# Patient Record
Sex: Female | Born: 1935 | Race: Black or African American | Hispanic: No | Smoking: Former smoker
Health system: Southern US, Community
[De-identification: ages and names within clinical notes are randomized; demographics above are authoritative.]

## PROBLEM LIST (undated history)

## (undated) DIAGNOSIS — R0602 Shortness of breath: Secondary | ICD-10-CM

## (undated) DIAGNOSIS — N390 Urinary tract infection, site not specified: Secondary | ICD-10-CM

## (undated) DIAGNOSIS — E785 Hyperlipidemia, unspecified: Secondary | ICD-10-CM

## (undated) DIAGNOSIS — M1990 Unspecified osteoarthritis, unspecified site: Secondary | ICD-10-CM

## (undated) DIAGNOSIS — K5792 Diverticulitis of intestine, part unspecified, without perforation or abscess without bleeding: Secondary | ICD-10-CM

## (undated) DIAGNOSIS — K5790 Diverticulosis of intestine, part unspecified, without perforation or abscess without bleeding: Secondary | ICD-10-CM

## (undated) DIAGNOSIS — R112 Nausea with vomiting, unspecified: Secondary | ICD-10-CM

## (undated) DIAGNOSIS — N179 Acute kidney failure, unspecified: Secondary | ICD-10-CM

## (undated) DIAGNOSIS — I10 Essential (primary) hypertension: Secondary | ICD-10-CM

## (undated) DIAGNOSIS — K831 Obstruction of bile duct: Secondary | ICD-10-CM

## (undated) DIAGNOSIS — I499 Cardiac arrhythmia, unspecified: Secondary | ICD-10-CM

## (undated) DIAGNOSIS — Z9889 Other specified postprocedural states: Secondary | ICD-10-CM

## (undated) HISTORY — PX: KNEE SURGERY: SHX244

## (undated) HISTORY — DX: Essential (primary) hypertension: I10

## (undated) HISTORY — DX: Urinary tract infection, site not specified: N39.0

## (undated) HISTORY — DX: Diverticulitis of intestine, part unspecified, without perforation or abscess without bleeding: K57.92

## (undated) HISTORY — PX: TOTAL ABDOMINAL HYSTERECTOMY: SHX209

## (undated) HISTORY — DX: Unspecified osteoarthritis, unspecified site: M19.90

## (undated) HISTORY — PX: ERCP: SHX60

## (undated) HISTORY — DX: Diverticulosis of intestine, part unspecified, without perforation or abscess without bleeding: K57.90

## (undated) HISTORY — PX: CHOLECYSTECTOMY: SHX55

## (undated) HISTORY — PX: TOTAL KNEE ARTHROPLASTY: SHX125

## (undated) HISTORY — DX: Obstruction of bile duct: K83.1

## (undated) HISTORY — DX: Hyperlipidemia, unspecified: E78.5

## (undated) HISTORY — PX: FOOT SURGERY: SHX648

---

## 2005-06-16 ENCOUNTER — Inpatient Hospital Stay (HOSPITAL_BASED_OUTPATIENT_CLINIC_OR_DEPARTMENT_OTHER): Admission: RE | Admit: 2005-06-16 | Discharge: 2005-06-16 | Payer: Self-pay | Admitting: *Deleted

## 2008-10-24 ENCOUNTER — Emergency Department (HOSPITAL_COMMUNITY): Admission: EM | Admit: 2008-10-24 | Discharge: 2008-10-24 | Payer: Self-pay | Admitting: Emergency Medicine

## 2010-01-06 ENCOUNTER — Emergency Department (HOSPITAL_COMMUNITY): Admission: EM | Admit: 2010-01-06 | Discharge: 2010-01-06 | Payer: Self-pay | Admitting: Emergency Medicine

## 2010-10-17 ENCOUNTER — Encounter: Payer: Self-pay | Admitting: Cardiovascular Disease

## 2010-10-23 ENCOUNTER — Encounter: Payer: Self-pay | Admitting: Cardiovascular Disease

## 2010-10-23 ENCOUNTER — Ambulatory Visit (INDEPENDENT_AMBULATORY_CARE_PROVIDER_SITE_OTHER): Payer: Medicare Other | Admitting: Cardiovascular Disease

## 2010-10-23 VITALS — BP 118/66 | HR 60 | Wt 160.0 lb

## 2010-10-23 DIAGNOSIS — E785 Hyperlipidemia, unspecified: Secondary | ICD-10-CM

## 2010-10-23 DIAGNOSIS — I10 Essential (primary) hypertension: Secondary | ICD-10-CM

## 2010-10-23 DIAGNOSIS — Z0000 Encounter for general adult medical examination without abnormal findings: Secondary | ICD-10-CM

## 2010-10-23 NOTE — Assessment & Plan Note (Signed)
Her blood pressure is well controlled. She stopped taking the losartan HCTZ and is now just on Toprol-XL. She should continue this medication. We will have her followup with Dr. Paulino Rily. For this and consider on an as-needed basis.

## 2010-10-23 NOTE — Assessment & Plan Note (Signed)
This is been followed by Dr. Paulino Rily. We'll see her on an as-needed basis.

## 2010-10-23 NOTE — Progress Notes (Signed)
History of Present Illness: Kimberly Rasmussen is an elderly female the history of hypertension and hypercholesterolemia. She's a  previous  a patient of Dr. Reyes Ivan.She's not had any episodes of chest pain or shortness breath.  Current Outpatient Prescriptions on File Prior to Visit  Medication Sig Dispense Refill  . Acetaminophen (TYLENOL PO) Take by mouth as needed.        . metoprolol succinate (TOPROL-XL) 25 MG 24 hr tablet Take 25 mg by mouth daily.        Marland Kitchen NIACIN PO Take by mouth at bedtime.        . simvastatin (ZOCOR) 20 MG tablet Take 20 mg by mouth at bedtime.        Marland Kitchen DISCONTD: losartan-hydrochlorothiazide (HYZAAR) 100-25 MG per tablet Take 1 tablet by mouth daily.        Marland Kitchen DISCONTD: MELOXICAM PO Take by mouth as needed.        Marland Kitchen DISCONTD: Nitroglycerin (NITRO-DUR TD) Place onto the skin daily.        Marland Kitchen DISCONTD: nitroGLYCERIN (NITROSTAT) 0.4 MG SL tablet Place 0.4 mg under the tongue every 5 (five) minutes as needed.        Marland Kitchen DISCONTD: traMADol (ULTRAM) 50 MG tablet Take 50 mg by mouth as needed.        Marland Kitchen DISCONTD: Tramadol-Acetaminophen (ULTRACET PO) Take by mouth as needed.          No Known Allergies  Past Medical History  Diagnosis Date  . HTN (hypertension)   . Dyslipidemia   . Diverticulosis   . Arthritis   . Diverticulitis     Past Surgical History  Procedure Date  . Total abdominal hysterectomy   . Total knee arthroplasty     RIGHT KNEE  . Knee surgery     LEFT KNEE  . Cholecystectomy     History  Smoking status  . Former Smoker -- 0.5 packs/day for 40 years  . Types: Cigarettes  . Quit date: 10/17/1998  Smokeless tobacco  . Not on file    History  Alcohol Use     Family History  Problem Relation Age of Onset  . Pneumonia Mother   . Lung cancer Sister   . Hypertension Maternal Grandmother   . Stroke Maternal Grandmother     Review of Systems: The review of systems is Noted in the history of present illness. All other systems are negative.   All other systems were reviewed and are negative.  Physical Exam: BP 118/66  Pulse 60  Wt 160 lb (72.576 kg) The patient is alert and oriented x 3.  The mood and affect are normal.  The skin is warm and dry.  Color is normal.  The HEENT exam reveals that the sclera are nonicteric.  The mucous membranes are moist.  The carotids are 2+ without bruits.  There is no thyromegaly.  There is no JVD.  The lungs are clear.  The chest wall is non tender.  The heart exam reveals a regular rate with a normal S1 and S2.  There are no murmurs, gallops, or rubs.  The PMI is not displaced.   Abdominal exam reveals good bowel sounds.  There is no guarding or rebound.  There is no hepatosplenomegaly or tenderness.  There are no masses.  Exam of the legs reveal no clubbing, cyanosis, or edema.  The legs are without rashes.  The distal pulses are intact.  Cranial nerves II - XII are intact.  Motor and sensory  functions are intact.  The gait is normal.  ECG: Normal sinus rhythm. She has occasional premature ventricular contractions Assessment / Plan:

## 2010-10-23 NOTE — Patient Instructions (Signed)
Watch your salt.  Exercise regularly.

## 2010-11-06 LAB — CBC
HCT: 37.3 % (ref 36.0–46.0)
Hemoglobin: 12.4 g/dL (ref 12.0–15.0)
MCHC: 33.3 g/dL (ref 30.0–36.0)
MCV: 88 fL (ref 78.0–100.0)
RDW: 16.5 % — ABNORMAL HIGH (ref 11.5–15.5)

## 2010-11-06 LAB — URINE MICROSCOPIC-ADD ON

## 2010-11-06 LAB — URINE CULTURE

## 2010-11-06 LAB — DIFFERENTIAL
Basophils Relative: 0 % (ref 0–1)
Eosinophils Absolute: 0.2 10*3/uL (ref 0.0–0.7)
Eosinophils Relative: 2 % (ref 0–5)
Lymphocytes Relative: 17 % (ref 12–46)
Neutrophils Relative %: 72 % (ref 43–77)

## 2010-11-06 LAB — URINALYSIS, ROUTINE W REFLEX MICROSCOPIC
Hgb urine dipstick: NEGATIVE
Nitrite: POSITIVE — AB
Specific Gravity, Urine: 1.014 (ref 1.005–1.030)
Urobilinogen, UA: 0.2 mg/dL (ref 0.0–1.0)

## 2010-11-06 LAB — COMPREHENSIVE METABOLIC PANEL
BUN: 15 mg/dL (ref 6–23)
Calcium: 9.3 mg/dL (ref 8.4–10.5)
Creatinine, Ser: 1.18 mg/dL (ref 0.4–1.2)
Glucose, Bld: 94 mg/dL (ref 70–99)
Total Protein: 6.5 g/dL (ref 6.0–8.3)

## 2010-12-12 NOTE — Cardiovascular Report (Signed)
NAME:  Kimberly Rasmussen, Kimberly Rasmussen NO.:  [id number]   MEDICAL RECORD NO.:  [id number]          PATIENT TYPE:  OIB   LOCATION:  1965                         FACILITY:  [facility]   PHYSICIAN:  Elmore Guise., M.D.DATE OF BIRTH:  04/14/1926   DATE OF PROCEDURE:  06/16/2005  DATE OF DISCHARGE:                              CARDIAC CATHETERIZATION   INDICATIONS FOR PROCEDURE:  Increasing exertional angina/dyspnea.   DESCRIPTION OF PROCEDURE:  The patient was brought to the cardiac  catheterization laboratory after appropriate informed consent.  She is  prepped and draped in a sterile fashion.  Approximately 10 mL of 1%  lidocaine is used for local anesthesia.  A 4-French sheath was placed in the  right femoral artery without difficulty.  Coronary angiography, LV  angiography were then performed.   FINDINGS:  1.  Left main:  Normal.  2.  LAD:  Mild luminal irregularities.  3.  D1:  Large vessel with mild luminal irregularities.  4.  D2:  Moderate sized vessel.  Mild luminal irregularities.  5.  LCX:  Nondominant.  Mild luminal irregularities.  6.  OM1/OM2:  Moderate sized vessels with mild luminal irregularities.  7.  RCA:  Dominant with mild luminal irregularities.  8.  PDA/PLV:  Mild luminal irregularities.  9.  LV:  EF is 55-60%.  No wall motion abnormalities.  LVEDP is 18 mmHg.   IMPRESSION:  1.  Non-obstructive coronary arteries with mild luminal irregularities.  2.  Preserved left ventricular systolic function with an ejection fraction      of 55-60%.   PLAN:  Aggressive risk factor modification.      Elmore Guise., M.D.  Electronically Signed     TWK/MEDQ  D:  06/16/2005  T:  06/16/2005  Job:  [id number]   cc:   Evelena Peat, M.D.  Fax: [phone]

## 2011-01-05 ENCOUNTER — Inpatient Hospital Stay (HOSPITAL_COMMUNITY)
Admission: EM | Admit: 2011-01-05 | Discharge: 2011-01-08 | DRG: 690 | Disposition: A | Discharge status: Skilled Nursing Facility | Payer: Medicare Other | Attending: Internal Medicine | Admitting: Internal Medicine

## 2011-01-05 ENCOUNTER — Emergency Department (HOSPITAL_COMMUNITY): Payer: Medicare Other

## 2011-01-05 DIAGNOSIS — I10 Essential (primary) hypertension: Secondary | ICD-10-CM | POA: Diagnosis present

## 2011-01-05 DIAGNOSIS — I2510 Atherosclerotic heart disease of native coronary artery without angina pectoris: Secondary | ICD-10-CM | POA: Diagnosis present

## 2011-01-05 DIAGNOSIS — A498 Other bacterial infections of unspecified site: Secondary | ICD-10-CM | POA: Diagnosis present

## 2011-01-05 DIAGNOSIS — I951 Orthostatic hypotension: Secondary | ICD-10-CM | POA: Diagnosis present

## 2011-01-05 DIAGNOSIS — K5289 Other specified noninfective gastroenteritis and colitis: Secondary | ICD-10-CM | POA: Diagnosis present

## 2011-01-05 DIAGNOSIS — K5730 Diverticulosis of large intestine without perforation or abscess without bleeding: Secondary | ICD-10-CM | POA: Diagnosis present

## 2011-01-05 DIAGNOSIS — E559 Vitamin D deficiency, unspecified: Secondary | ICD-10-CM | POA: Diagnosis present

## 2011-01-05 DIAGNOSIS — N390 Urinary tract infection, site not specified: Principal | ICD-10-CM | POA: Diagnosis present

## 2011-01-05 DIAGNOSIS — E785 Hyperlipidemia, unspecified: Secondary | ICD-10-CM | POA: Diagnosis present

## 2011-01-05 DIAGNOSIS — Z23 Encounter for immunization: Secondary | ICD-10-CM

## 2011-01-05 LAB — DIFFERENTIAL
Basophils Absolute: 0 10*3/uL (ref 0.0–0.1)
Basophils Relative: 0 % (ref 0–1)
Eosinophils Relative: 0 % (ref 0–5)
Lymphocytes Relative: 10 % — ABNORMAL LOW (ref 12–46)
Lymphs Abs: 2.5 10*3/uL (ref 0.7–4.0)
Neutro Abs: 21.4 10*3/uL — ABNORMAL HIGH (ref 1.7–7.7)
Neutrophils Relative %: 56 % (ref 43–77)
Promyelocytes Absolute: 0 %
nRBC: 0 /100 WBC

## 2011-01-05 LAB — URINALYSIS, ROUTINE W REFLEX MICROSCOPIC
Glucose, UA: NEGATIVE mg/dL
Nitrite: POSITIVE — AB
Specific Gravity, Urine: 1.026 (ref 1.005–1.030)
pH: 5 (ref 5.0–8.0)

## 2011-01-05 LAB — CK TOTAL AND CKMB (NOT AT ARMC)
CK, MB: 3.3 ng/mL (ref 0.3–4.0)
Relative Index: INVALID (ref 0.0–2.5)
Total CK: 98 U/L (ref 7–177)

## 2011-01-05 LAB — CBC
HCT: 38.5 % (ref 36.0–46.0)
MCH: 27.9 pg (ref 26.0–34.0)
MCV: 83.2 fL (ref 78.0–100.0)
Platelets: 92 10*3/uL — ABNORMAL LOW (ref 150–400)
RBC: 4.63 MIL/uL (ref 3.87–5.11)

## 2011-01-05 LAB — URINE MICROSCOPIC-ADD ON

## 2011-01-05 LAB — BASIC METABOLIC PANEL
BUN: 43 mg/dL — ABNORMAL HIGH (ref 6–23)
Calcium: 9.8 mg/dL (ref 8.4–10.5)
Creatinine, Ser: 1.23 mg/dL — ABNORMAL HIGH (ref 0.4–1.2)
GFR calc Af Amer: 50 mL/min — ABNORMAL LOW (ref >60)
GFR calc non Af Amer: 42 mL/min — ABNORMAL LOW (ref >60)
Glucose, Bld: 75 mg/dL (ref 70–99)

## 2011-01-06 ENCOUNTER — Encounter (HOSPITAL_COMMUNITY): Payer: Self-pay | Admitting: Radiology

## 2011-01-06 ENCOUNTER — Inpatient Hospital Stay (HOSPITAL_COMMUNITY): Payer: Medicare Other

## 2011-01-06 LAB — COMPREHENSIVE METABOLIC PANEL
AST: 48 U/L — ABNORMAL HIGH (ref 0–37)
Albumin: 2.1 g/dL — ABNORMAL LOW (ref 3.5–5.2)
Alkaline Phosphatase: 167 U/L — ABNORMAL HIGH (ref 39–117)
CO2: 22 mEq/L (ref 19–32)
Chloride: 103 mEq/L (ref 96–112)
GFR calc non Af Amer: 51 mL/min — ABNORMAL LOW (ref >60)
Potassium: 3.6 mEq/L (ref 3.5–5.1)
Total Bilirubin: 0.8 mg/dL (ref 0.3–1.2)

## 2011-01-06 LAB — LACTATE DEHYDROGENASE: LDH: 243 U/L (ref 94–250)

## 2011-01-06 LAB — DIFFERENTIAL
Basophils Relative: 0 % (ref 0–1)
Eosinophils Relative: 1 % (ref 0–5)
Lymphs Abs: 1.6 10*3/uL (ref 0.7–4.0)
Monocytes Absolute: 0.8 10*3/uL (ref 0.1–1.0)
Monocytes Relative: 4 % (ref 3–12)

## 2011-01-06 LAB — CBC
HCT: 33.5 % — ABNORMAL LOW (ref 36.0–46.0)
Hemoglobin: 11.6 g/dL — ABNORMAL LOW (ref 12.0–15.0)
MCHC: 34.6 g/dL (ref 30.0–36.0)
MCV: 82.1 fL (ref 78.0–100.0)
RDW: 15.8 % — ABNORMAL HIGH (ref 11.5–15.5)
WBC: 19.8 10*3/uL — ABNORMAL HIGH (ref 4.0–10.5)

## 2011-01-06 LAB — APTT: aPTT: 34 seconds (ref 24–37)

## 2011-01-06 LAB — PROTIME-INR: INR: 1.2 (ref 0.00–1.49)

## 2011-01-06 LAB — GLUCOSE, CAPILLARY

## 2011-01-06 MED ORDER — IOHEXOL 300 MG/ML  SOLN
100.0000 mL | Freq: Once | INTRAMUSCULAR | Status: AC | PRN
  Administered 2011-01-06: 100 mL via INTRAVENOUS

## 2011-01-07 ENCOUNTER — Inpatient Hospital Stay (HOSPITAL_COMMUNITY): Payer: Medicare Other

## 2011-01-07 LAB — CBC
MCH: 27.7 pg (ref 26.0–34.0)
MCHC: 33.9 g/dL (ref 30.0–36.0)
RDW: 16 % — ABNORMAL HIGH (ref 11.5–15.5)

## 2011-01-07 LAB — BASIC METABOLIC PANEL
Calcium: 8.6 mg/dL (ref 8.4–10.5)
GFR calc Af Amer: >60 mL/min (ref >60)
GFR calc non Af Amer: >60 mL/min (ref >60)
Potassium: 3.5 mEq/L (ref 3.5–5.1)
Sodium: 133 mEq/L — ABNORMAL LOW (ref 135–145)

## 2011-01-07 LAB — URINE CULTURE
Colony Count: >=100000
Culture  Setup Time: 201206120522

## 2011-01-07 LAB — HAPTOGLOBIN: Haptoglobin: 429 mg/dL — ABNORMAL HIGH (ref 30–200)

## 2011-01-08 LAB — BASIC METABOLIC PANEL
CO2: 21 mEq/L (ref 19–32)
Calcium: 8.6 mg/dL (ref 8.4–10.5)
Chloride: 107 mEq/L (ref 96–112)
Creatinine, Ser: 0.51 mg/dL (ref 0.4–1.2)
GFR calc Af Amer: >60 mL/min (ref >60)
Sodium: 136 mEq/L (ref 135–145)

## 2011-01-12 ENCOUNTER — Other Ambulatory Visit: Payer: Self-pay | Admitting: Internal Medicine

## 2011-01-12 DIAGNOSIS — K529 Noninfective gastroenteritis and colitis, unspecified: Secondary | ICD-10-CM

## 2011-01-12 LAB — CULTURE, BLOOD (ROUTINE X 2)
Culture  Setup Time: 201206122241
Culture: NO GROWTH

## 2011-01-13 ENCOUNTER — Inpatient Hospital Stay (HOSPITAL_COMMUNITY)
Admission: EM | Admit: 2011-01-13 | Discharge: 2011-01-17 | DRG: 445 | Disposition: A | Discharge status: Home Health | Payer: Medicare Other | Attending: Internal Medicine | Admitting: Internal Medicine

## 2011-01-13 ENCOUNTER — Ambulatory Visit
Admission: RE | Admit: 2011-01-13 | Discharge: 2011-01-13 | Disposition: A | Discharge status: Home / Self Care | Payer: Medicare Other | Source: Ambulatory Visit | Attending: Internal Medicine | Admitting: Internal Medicine

## 2011-01-13 DIAGNOSIS — D649 Anemia, unspecified: Secondary | ICD-10-CM | POA: Diagnosis present

## 2011-01-13 DIAGNOSIS — E559 Vitamin D deficiency, unspecified: Secondary | ICD-10-CM | POA: Diagnosis present

## 2011-01-13 DIAGNOSIS — Z87440 Personal history of urinary (tract) infections: Secondary | ICD-10-CM

## 2011-01-13 DIAGNOSIS — K449 Diaphragmatic hernia without obstruction or gangrene: Secondary | ICD-10-CM | POA: Diagnosis present

## 2011-01-13 DIAGNOSIS — B029 Zoster without complications: Secondary | ICD-10-CM | POA: Diagnosis present

## 2011-01-13 DIAGNOSIS — K5732 Diverticulitis of large intestine without perforation or abscess without bleeding: Secondary | ICD-10-CM | POA: Diagnosis present

## 2011-01-13 DIAGNOSIS — K8051 Calculus of bile duct without cholangitis or cholecystitis with obstruction: Principal | ICD-10-CM | POA: Diagnosis present

## 2011-01-13 DIAGNOSIS — I2510 Atherosclerotic heart disease of native coronary artery without angina pectoris: Secondary | ICD-10-CM | POA: Diagnosis present

## 2011-01-13 DIAGNOSIS — R5381 Other malaise: Secondary | ICD-10-CM | POA: Diagnosis present

## 2011-01-13 DIAGNOSIS — E785 Hyperlipidemia, unspecified: Secondary | ICD-10-CM | POA: Diagnosis present

## 2011-01-13 DIAGNOSIS — I10 Essential (primary) hypertension: Secondary | ICD-10-CM | POA: Diagnosis present

## 2011-01-13 DIAGNOSIS — E871 Hypo-osmolality and hyponatremia: Secondary | ICD-10-CM | POA: Diagnosis present

## 2011-01-13 DIAGNOSIS — K4090 Unilateral inguinal hernia, without obstruction or gangrene, not specified as recurrent: Secondary | ICD-10-CM | POA: Diagnosis present

## 2011-01-13 DIAGNOSIS — Z79899 Other long term (current) drug therapy: Secondary | ICD-10-CM

## 2011-01-13 DIAGNOSIS — Z96659 Presence of unspecified artificial knee joint: Secondary | ICD-10-CM

## 2011-01-13 DIAGNOSIS — K529 Noninfective gastroenteritis and colitis, unspecified: Secondary | ICD-10-CM

## 2011-01-13 LAB — DIFFERENTIAL
Basophils Relative: 0 % (ref 0–1)
Eosinophils Relative: 0 % (ref 0–5)
Lymphocytes Relative: 6 % — ABNORMAL LOW (ref 12–46)
Lymphs Abs: 1.6 10*3/uL (ref 0.7–4.0)
Monocytes Relative: 7 % (ref 3–12)
Neutro Abs: 23.8 10*3/uL — ABNORMAL HIGH (ref 1.7–7.7)

## 2011-01-13 LAB — CBC
HCT: 29.8 % — ABNORMAL LOW (ref 36.0–46.0)
Hemoglobin: 10.5 g/dL — ABNORMAL LOW (ref 12.0–15.0)
MCH: 28.1 pg (ref 26.0–34.0)
MCV: 79.7 fL (ref 78.0–100.0)
Platelets: 500 10*3/uL — ABNORMAL HIGH (ref 150–400)
RBC: 3.74 MIL/uL — ABNORMAL LOW (ref 3.87–5.11)
WBC: 27.3 10*3/uL — ABNORMAL HIGH (ref 4.0–10.5)

## 2011-01-13 LAB — COMPREHENSIVE METABOLIC PANEL
ALT: 29 U/L (ref 0–35)
AST: 69 U/L — ABNORMAL HIGH (ref 0–37)
Alkaline Phosphatase: 417 U/L — ABNORMAL HIGH (ref 39–117)
Calcium: 8.9 mg/dL (ref 8.4–10.5)
Glucose, Bld: 84 mg/dL (ref 70–99)
Potassium: 5.1 mEq/L (ref 3.5–5.1)
Sodium: 129 mEq/L — ABNORMAL LOW (ref 135–145)
Total Protein: 7.1 g/dL (ref 6.0–8.3)

## 2011-01-13 MED ORDER — IOHEXOL 300 MG/ML  SOLN
100.0000 mL | Freq: Once | INTRAMUSCULAR | Status: AC | PRN
  Administered 2011-01-13: 100 mL via INTRAVENOUS

## 2011-01-14 LAB — CBC
Hemoglobin: 8.5 g/dL — ABNORMAL LOW (ref 12.0–15.0)
MCH: 27.5 pg (ref 26.0–34.0)
Platelets: 472 10*3/uL — ABNORMAL HIGH (ref 150–400)
RBC: 3.09 MIL/uL — ABNORMAL LOW (ref 3.87–5.11)

## 2011-01-14 LAB — CARDIAC PANEL(CRET KIN+CKTOT+MB+TROPI)
CK, MB: 1.2 ng/mL (ref 0.3–4.0)
CK, MB: 1.6 ng/mL (ref 0.3–4.0)
Relative Index: INVALID (ref 0.0–2.5)
Relative Index: INVALID (ref 0.0–2.5)
Total CK: 17 U/L (ref 7–177)
Total CK: 15 U/L (ref 7–177)
Troponin I: <0.3 ng/mL (ref <0.30)
Troponin I: <0.3 ng/mL (ref <0.30)

## 2011-01-14 LAB — COMPREHENSIVE METABOLIC PANEL
AST: 44 U/L — ABNORMAL HIGH (ref 0–37)
CO2: 24 mEq/L (ref 19–32)
Calcium: 8.6 mg/dL (ref 8.4–10.5)
Creatinine, Ser: 0.57 mg/dL (ref 0.50–1.10)
GFR calc Af Amer: >60 mL/min (ref >60)
GFR calc non Af Amer: >60 mL/min (ref >60)
Glucose, Bld: 101 mg/dL — ABNORMAL HIGH (ref 70–99)
Total Protein: 6.1 g/dL (ref 6.0–8.3)

## 2011-01-14 LAB — MRSA PCR SCREENING: MRSA by PCR: NEGATIVE

## 2011-01-15 LAB — COMPREHENSIVE METABOLIC PANEL
ALT: 20 U/L (ref 0–35)
AST: 34 U/L (ref 0–37)
Albumin: 1.6 g/dL — ABNORMAL LOW (ref 3.5–5.2)
Alkaline Phosphatase: 258 U/L — ABNORMAL HIGH (ref 39–117)
BUN: 5 mg/dL — ABNORMAL LOW (ref 6–23)
Chloride: 105 mEq/L (ref 96–112)
Potassium: 3.7 mEq/L (ref 3.5–5.1)
Sodium: 135 mEq/L (ref 135–145)
Total Bilirubin: 1.7 mg/dL — ABNORMAL HIGH (ref 0.3–1.2)

## 2011-01-15 LAB — CBC
MCH: 27.5 pg (ref 26.0–34.0)
MCV: 80.5 fL (ref 78.0–100.0)
Platelets: 551 10*3/uL — ABNORMAL HIGH (ref 150–400)
RDW: 16.2 % — ABNORMAL HIGH (ref 11.5–15.5)

## 2011-01-16 ENCOUNTER — Inpatient Hospital Stay (HOSPITAL_COMMUNITY): Payer: Medicare Other

## 2011-01-16 LAB — COMPREHENSIVE METABOLIC PANEL
ALT: 18 U/L (ref 0–35)
AST: 28 U/L (ref 0–37)
Calcium: 8.3 mg/dL — ABNORMAL LOW (ref 8.4–10.5)
Creatinine, Ser: 0.53 mg/dL (ref 0.50–1.10)
GFR calc Af Amer: >60 mL/min (ref >60)
GFR calc non Af Amer: >60 mL/min (ref >60)
Sodium: 134 mEq/L — ABNORMAL LOW (ref 135–145)
Total Protein: 6.2 g/dL (ref 6.0–8.3)

## 2011-01-16 LAB — CBC
MCH: 27.6 pg (ref 26.0–34.0)
MCHC: 33.9 g/dL (ref 30.0–36.0)
MCV: 81.4 fL (ref 78.0–100.0)
Platelets: 603 10*3/uL — ABNORMAL HIGH (ref 150–400)

## 2011-01-18 NOTE — Consult Note (Signed)
NAME:  Kimberly Rasmussen, Kimberly Rasmussen NO.:  [id number]  MEDICAL RECORD NO.:  [id number]  LOCATION:  1332                         FACILITY:  [facility]  PHYSICIAN:  Juanetta Gosling, MDDATE OF BIRTH:  09-Mar-1926  DATE OF CONSULTATION: DATE OF DISCHARGE:                                CONSULTATION   CHIEF COMPLAINT:  Common bile duct stones and some abdominal pain.  HISTORY OF PRESENT ILLNESS:  This is 77 year old female, who was admitted from the January 05, 2011 to the January 08, 2011 for orthostatic hypotension as well as abdominal pain.  At that point, she was diagnosed with diverticular disease with diverticulitis by CT scan as well as having some left lower quadrant pain and this apparently improved and she was then transferred back to a nursing home.  She then 5 days later returned to the hospital with abdominal pain and was having 7-8 intensity abdominal pain in the periumbilical region as well as the left lower quadrant.  She has had some nausea as well.  She underwent evaluation again with a CT scan that showed prominent diverticula, but less likely diverticulitis, some markedly dilated intrahepatic and extrahepatic bile ducts with three gallstones measuring up to 3 cm and moved a little bit since her CT a week earlier.  She also has a number of other findings on this as well.  She has then undergone an ERCP today by Dr. Jeani Hawking with dilated biliary tree with some large common bile duct intraductal calculi with a common bile duct stent inserted today.  We were asked to discuss possible surgical therapy for these common bile duct stones.  PAST MEDICAL HISTORY:  Significant for: 1. History of diverticular disease. 2. Hyperlipidemia. 3. Hypertension. 4. Coronary artery disease. 5. History of shingles.  PAST SURGICAL HISTORY: 1. Likely cholecystectomy by her imaging here and she states that in     [place] in early 90s, she had some stones removal, I believe she    did, but is not sure about her gallbladder neither is her family. 2. Hysterectomy. 3. Bilateral knee surgeries. 4. Right total knee replacement. 5. Foot surgery.  ALLERGIES: 1. TRAMADOL. 2. AMITRIPTYLINE. 3. NEURONTIN. 4. MELOXICAM.  MEDICATIONS:  As of today are: 1. Flonase. 2. Ciprofloxacin. 3. Flagyl. 4. P.r.n. Dilaudid. 5. P.r.n. Zofran. 6. Tylenol.  SOCIAL HISTORY:  She does not use alcohol and is an ex-smoker.  PHYSICAL EXAMINATION:  VITAL SIGNS:  Today, shows her to be somewhat somnolent after an ERCP earlier.  She is afebrile with otherwise her rate 80, blood pressure 135/75, respirations 17 and 96 on room air. GENERAL:  She is a well-appearing female, in no distress. HEART:  Regular. LUNGS:  Clear. ABDOMEN:  Soft.  It is nontender.  It is nondistended.  She has no left lower quadrant tenderness or any tenderness in her epigastrium or right upper quadrant.  She has a well-healed upper midline incision.  LABORATORY DATA:  Laboratory evaluation today, her CMET is significant for a sodium of 134, BUN of 3, total bilirubin of 1.4, alkaline phosphatase 213, albumin is 1.6.  Her white blood cell count is 13.1, hematocrit 25.4, platelets are 603,000.  Bilirubin on the January 14, 2011 was 3.0.  DIAGNOSTIC STUDIES:  Her radiologic evaluation is a CT as described before as well as her ERCP.  ASSESSMENT: 1. Choledocholithiasis after possible cholecystectomy in the past. 2. Possible diverticulitis.  PLAN:  I think  with her choledocholithiasis it may well be the best plan for possible lithotripsy. I have now discussed this with Dr. Elnoria Howard.  I discussed a common bile duct exploration.  I think this will be somewhat difficult given her prior surgery, her age as well as we discussed the procedure with a T- tube afterwards and the risks involved with surgery. She also appears to have a prior cholecystectomy. She would like to try to do this nonoperatively possible and I think  that it is reasonable if there is another option, especially given her albumin and her debilitated state for been in the hospital recently as well.  I think she is fairly at high risk for an operative procedure.  I discussed this with Dr. Elnoria Howard and he would be willing to try do a repeat ERCP and a lithotripsy and I think that that would be reasonable.  If that fails, we can save surgery as an option.  She is currently drained right now, so there is no emergency about taking her to the operating room.     Juanetta Gosling, MD     MCW/MEDQ  D:  01/16/2011  T:  01/16/2011  Job:  [id number]  cc:   Jasmine December Dr. Cyndia Bent D. Elnoria Howard, MD Fax: [phone]  Electronically Signed by Emelia Loron MD on 01/18/2011 11:01:02 AM

## 2011-01-19 NOTE — H&P (Signed)
NAME:  Kimberly Rasmussen, Kimberly Rasmussen NO.:  [id number]  MEDICAL RECORD NO.:  [id number]  LOCATION:  WLED                         FACILITY:  [facility]  PHYSICIAN:  Kathlen Mody, MD       DATE OF BIRTH:  11-05-1925  DATE OF ADMISSION:  01/05/2011 DATE OF DISCHARGE:                             HISTORY & PHYSICAL   CHIEF COMPLAINT:  Sending from PMD's office for orthostatic hypotension and abdominal pain.  HISTORY OF PRESENT ILLNESS:  This is an 77 year old lady with history of hypertension, hyperlipidemia, diverticulosis, was complaining of left lower quadrant abdominal pain since 3-4 days, associated with nausea and vomiting and low grade fever.  No history of chills.  The history was obtained both from the patient and the patient's daughter who is at the bedside.  The patient also has been lethargic over the last 3-4 days, after the patient's daughter took the patient to PMD's office where she was found to have orthostatic hypotension and left lower quadrant abdominal pain.  The patient has occasional shortness of breath.  No history of cough or syncope, chest pain, or palpitations.  Nausea has improved with the medication given in the ER.  Occasional loose bowel movements with some blood on the toilet paper and history of generalized weakness to the point that she was not able to ambulate for the last 2 days.  The patient denies any headache or blurry vision.  Denies any tingling or numbness or any focal weakness in her extremities.  REVIEW OF SYSTEMS:  See HPI, otherwise negative.  PAST MEDICAL HISTORY: 1. History of hypertension. 2. Hyperlipidemia. 3. Nonobstructive coronary artery disease. 4. Diverticulosis with colonoscopy about a year ago. 5. Vitamin D deficiency. 6. Shingles with neuralgia.  PAST SURGICAL HISTORY:  History of cholecystectomy, cataract surgery, history of hysterectomy, history of bilateral knee surgery, history of left ORIF, right TKR, history of  foot surgery.  HOME MEDICATIONS:  Please see Med-Rec for detail of medications and their doses.  SOCIAL HISTORY:  The patient lives by herself, ex-smoker, denies EtOH or IV drug abuse.  The patient's daughter lives 15 minutes from her.  ALLERGIES:  The patient is allergic to: 1. TRAMADOL. 2. AMITRIPTYLINE. 3. GABAPENTIN. 4. MELOXICAM.  PHYSICAL EXAMINATION:  VITAL SIGNS:  The patient's vitals include temperature of 98.1, pulse of 88, blood pressure of 100/50, respirations 16, saturating 98% on room air. GENERAL:  She is alert.  She is afebrile.  She is communicating.  She is in no acute distress. HEENT:  The pupils are reacting to light.  Dry mucous membranes.  No JVD. CARDIOVASCULAR:  S1 and S2 heard.  Regular rate and rhythm. RESPIRATORY:  Chest is clear to auscultation bilaterally.  No wheezing or rhonchi. ABDOMEN:  Soft.  Mild left lower quadrant tenderness.  Bowel sounds present.  No signs of peritonitis. EXTREMITIES:  No pedal edema. NEUROLOGIC:  Able to move all her extremities.  She is alert and oriented x2. PERTINENT LABORATORY DATA:  On admission, the patient had a CBC significant for WBC count of 25.2, platelets of 92.  Basic metabolic panel significant for creatinine of 1.23, BUN of 43.  Urinalysis showed small leukocytes and positive nitrites with many bacteria.  Troponin  and CK-MB and CK were within normal limits.  RADIOLOGIC DATA:  The patient had a chest x-ray which showed some mild cardiomegaly with mild interstitial edema, minimal blunting of the costophrenic angle suspicious for small bilateral effusions.  Poor paraspinal extenuation along the descending thoracic aorta, to consider later chest radiography.  ASSESSMENT AND PLAN:  This is an 77 year old lady with history of hypertension, diverticulosis, admitted for left lower quadrant pain, low grade fevers, nausea and vomiting, loose bowel movements.  The patient is being admitted for lethargy secondary  to urinary tract infection. The patient was started on IV Rocephin in the ER.  We will continue the Rocephin and we will also give her IV fluids, normal saline 80 mL per hour.  Urine culture is pending.  Left lower quadrant pain/leukocytosis/loose bowel movements/occasional blood per rectum.  Rule out diverticulitis.  The patient already has a history of diverticulosis.  Will get a CT abdomen with pelvis with oral contrast.  Generalized weakness, most likely secondary to dehydration.  The patient's BUN and creatinine are slightly elevated, most likely prerenal from dehydration.  The patient is being started on intravenous fluids, normal saline at 80 mL per hour.  We will repeat basic metabolic panel in the morning.  Hypertension.  Blood pressure parameters on the lower side.  We will hold the blood pressure up till tomorrow and restart when the patient is normal again.  Thrombocytopenia.  Hold the Lovenox.  Start the patient on SCDs.  The patient is full code.          ______________________________ Kathlen Mody, MD     VA/MEDQ  D:  01/05/2011  T:  01/05/2011  Job:  [id number]  Electronically Signed by Kathlen Mody MD on 01/19/2011 02:28:12 AM

## 2011-01-20 LAB — CULTURE, BLOOD (ROUTINE X 2)
Culture  Setup Time: 201206200441
Culture: NO GROWTH
Culture: NO GROWTH

## 2011-01-25 NOTE — H&P (Signed)
NAME:  Kimberly Rasmussen, Kimberly Rasmussen          ACCOUNT NO.:  [id number]  MEDICAL RECORD NO.:  [id number]  LOCATION:  1332                         FACILITY:  [facility]  PHYSICIAN:  Lonia Blood, M.D.      DATE OF BIRTH:  March 04, 1926  DATE OF ADMISSION:  01/13/2011 DATE OF DISCHARGE:                             HISTORY & PHYSICAL   PRIMARY CARE PHYSICIAN:  Jasmine December A. Paulino Rily, MD  PRESENTING COMPLAINT:  Abdominal pain.  HISTORY OF PRESENT ILLNESS:  The patient is an 77 year old female who was last discharged from the hospital on January 08, 2011, that is 5 days ago, but returned now to the hospital with abdominal pain.  During last hospitalization, she was found to have mild acute colitis as well as UTI.  She was admitted then for orthostatic hypotension.  She went home and apparently has not been doing well at home.  She is having severe abdominal pain that was rated as 7/10 to 8/10, located in the periumbilical region, but going to the left lower quadrant.  She has also had some nausea, but no vomiting.  She has had cholecystectomy before.  Her pain is worsened with food or movement and there is no relieving factor.  She denied any hematemesis, no melena, no hematochezia.  PAST MEDICAL HISTORY: 1. Diverticular disease. 2. Hypertension. 3. Hyperlipidemia. 4. Recent UTI due to E coli. 5. History of coronary artery disease that was nonobstructive. 6. Vitamin D deficiency. 7. History of shingles with known neuralgia. 8. History of cholecystitis.  PAST SURGICAL HISTORY:  Status post cholecystectomy, status post hysterectomy, status post bilateral knee surgery, history of left ORIF, history of right TKR, history of foot surgery.  ALLERGIES: 1. TRAMADOL. 2. AMITRIPTYLINE that causes delusions. 3. NEURONTIN. 4. MELOXICAM.  CURRENT MEDICATIONS: 1. Cefuroxime that she is taking 500 mg twice a day. 2. Tylenol Extra Strength 500 mg 2 tablets daily. 3. Colace 100 mg twice daily. 4. Vitamin D3 over  the counter 1 capsule daily. 5. Skelaxin 800 mg daily. 6. Simvastatin 20 mg daily. 7. Niacin 250 mg daily. 8. Nasonex nasal spray 2 sprays each nostril daily. 9. Multivitamins 1 tablet daily. 10.Celebrex 200 mg daily. 11.Glucosamine 500 mg daily.  SOCIAL HISTORY:  The patient was just discharged from the hospital and she went home.  She lives alone.  She is an ex-smoker, but has not smoked in a while.  No alcohol or IV drug use.  She has a daughter, living about 50 minutes away from her and she helps her.  FAMILY HISTORY:  Noncontributory due to her age.  REVIEW OF SYSTEMS:  All systems reviewed currently are negative except per HPI.  PHYSICAL EXAMINATION:  VITAL SIGNS:  Temperature is 98.1, blood pressure 160/72 with pulse 96, respiratory rate 20, her saturation is 95% on room air.  GENERAL:  She is acutely ill-looking, frail woman, in no acute distress. HEENT:  PERRL.  EOMI.  No significant pallor or jaundice.  She is normocephalic. NECK:  Supple.  No visible JVD.  No lymphadenopathy. RESPIRATORY:  She has good air entry bilaterally.  No rhonchi, no rales, no wheezes. CARDIOVASCULAR SYSTEM:  Slightly irregularly irregular rhythm, but no murmur or gallop. ABDOMEN:  Soft, nondistended.  No  palpable masses.  No hepatosplenomegaly.  She has mild tenderness in the left lower quadrant. EXTREMITIES:  No edema, cyanosis, or clubbing. SKIN:  No rashes.  No ulcers.  Normal color. NEUROLOGIC:  She is alert and oriented x3.  No significant lateralizing signs.  LABORATORY DATA:  White count is 27.3 with a left shift ANC of 23.8, hemoglobin 10.5 with platelet count of 500.  Lipase is 61.  Sodium 129, potassium 5.1, chloride is 96, CO2 of 23, glucose 84, BUN 8, creatinine less than 0.47.  Calcium 8.9, total protein 7.1, albumin 2.2 with an AST 69, ALT 29, alkaline phosphatase 417, and total bilirubin 7.1.  CT abdomen and pelvis shows mild diverticulitis involving the descending colon,  sigmoid colon region, shifting of large extrahepatic bile duct stones with persistent marked bile duct dilatation, scoliosis, and degenerative changes in the lumbar spine, fatty content in the left inguinal hernia.  ASSESSMENT:  This is an 77 year old female presenting with what appears to be acute diverticulitis and choledocholithiasis.  She has other multiple medical problems which are chronic and seems stable.  PLAN: 1. Acute diverticulitis.  We will admit the patient for bowel rest,     pain control, and other symptoms control.  It seems like the     patient who has been on antibiotics, is not properly adequate.  She     is on cefuroxime, so we will switch her to Cipro and Flagyl.  GI     will be seeing the patient also in this followup.  No evidence of     abscess or clots. 2. Choledocholithiasis.  The patient would probably benefit from ERCP,     so GI will be consulted for that. 3. Hypertension:  Continue the patient's blood pressure medications as     much as possible.  She will be only on clear liquids, so we maybe     able to give her oral medications. 4. Hyperlipidemia.  Again, the patient has just had a workup and is on     home medications that will resume as soon as practical. 5. History of coronary artery disease.  The patient has not complained     of any chest pain and there is no evidence that she is having any     coronary syndrome, besides her disease is nonobstructive.  We will     watch her closely in the hospital. 6. Anemia, seems like anemia of chronic disease.  We will follow the     hemoglobin and hematocrit closely. 7. Recent UTI.  The UTI was secondary to E coli, was resistant to     Cipro, however, the patient has been on treatment for the required     period of time.  If needed, we will resume the cefuroxime that is     sensitive to, in addition to her Cipro and Flagyl. 8. Hyponatremia, more than likely from dehydration.  We will continue     with  hydration. 9. Final note, I will resume PT/OT on the patient as soon as possible.     Lonia Blood, M.D.     Verlin Grills  D:  01/14/2011  T:  01/14/2011  Job:  [id number]  Electronically Signed by Lonia Blood M.D. on 01/25/2011 12:37:16 AM

## 2011-01-26 ENCOUNTER — Telehealth: Payer: Self-pay | Admitting: Cardiovascular Disease

## 2011-01-26 NOTE — Telephone Encounter (Signed)
Faxed EKG to [facility] ([phone]).

## 2011-01-27 ENCOUNTER — Ambulatory Visit (HOSPITAL_COMMUNITY)
Admission: RE | Admit: 2011-01-27 | Discharge: 2011-01-27 | Disposition: A | Discharge status: Home / Self Care | Payer: Medicare Other | Source: Ambulatory Visit | Attending: Gastroenterology | Admitting: Gastroenterology

## 2011-01-30 ENCOUNTER — Ambulatory Visit (HOSPITAL_COMMUNITY): Payer: Medicare Other

## 2011-01-30 ENCOUNTER — Ambulatory Visit (HOSPITAL_COMMUNITY)
Admission: RE | Admit: 2011-01-30 | Discharge: 2011-01-30 | Disposition: A | Discharge status: Home / Self Care | Payer: Medicare Other | Source: Ambulatory Visit | Attending: Gastroenterology | Admitting: Gastroenterology

## 2011-01-30 DIAGNOSIS — K8050 Calculus of bile duct without cholangitis or cholecystitis without obstruction: Secondary | ICD-10-CM | POA: Insufficient documentation

## 2011-01-30 NOTE — Discharge Summary (Signed)
NAME:  CARLETA, WOODROW NO.:  [id number]  MEDICAL RECORD NO.:  [id number]  LOCATION:  1332                         FACILITY:  [facility]  PHYSICIAN:  Peggye Pitt, M.D. DATE OF BIRTH:  [birth year]  DATE OF ADMISSION:  01/13/2011 DATE OF DISCHARGE:  01/17/2011                              DISCHARGE SUMMARY   PRIMARY CARE PHYSICIAN:  Emeterio Reeve, MD.  SURGEON:  Juanetta Gosling, MD.  GI PHYSICIAN:  Anselmo Rod, MD, Clementeen Graham and Jordan Hawks. Elnoria Howard, MD  DISCHARGE DIAGNOSES: 1. Choledocholithiasis. 2. Sigmoid diverticulitis. 3. Transaminitis secondary to choledocholithiasis, improved. 4. Leukocytosis secondary to diverticulitis, improved. 5. Normocytic anemia. 6. History of hypertension. 7. Hyperlipidemia. 8. Nonobstructive coronary artery disease. 9. Vitamin D deficiency. 10.Prior history of shingles.  DISCHARGE MEDICATIONS: 1. Cipro 500 mg twice daily for 21 days. 2. Flagyl 500 mg 3 times a day for 21 days. 3. Zofran 4 mg every 6 hours as needed for nausea. 4. Oxycodone 5 mg every 4 hours as needed for pain. 5. Colace 100 mg twice daily. 6. Glucosamine 500 mg daily. 7. Multivitamin 1 tablet daily. 8. Nasonex 2 sprays in each nostril daily. 9. Niacin 250 mg daily. 10.Skelaxin 800 mg daily. 11.Vitamin D3 over-the-counter 1 tablet daily.  DISPOSITION AND FOLLOWUP:  Ms. Weyenberg will be discharged home today with her daughter, Bonita Quin, in stable and improved condition.  Please note that she has scheduled a repeat ERCP with lithotripsy with Dr. Elnoria Howard, scheduled for July 6.  She will follow up with Dr. Elnoria Howard for this issue.  CONSULTATION THIS HOSPITALIZATION:  Dr. Loreta Ave and Dr. Elnoria Howard with GI and Dr. Dwain Sarna with Surgery.  IMAGES AND PROCEDURES PERFORMED DURING THIS HOSPITALIZATION:  A CT scan of the abdomen and pelvis on June 19 showed prominent diverticula of the descending and sigmoid colon with mild diverticulitis without abscess or evidence of focal  perforation.  Mark dilated intrahepatic and extrahepatic bile ducts, which contained three gallstones measuring up to 3 cm.  A small hiatal hernia.  No hydronephrosis.  HISTORY AND PHYSICAL:  For full details, please see dictation on June 20 by Dr. Mikeal Hawthorne.  In brief, Ms. Lasky is an 77 year old African-American lady admitted to our hospital on June 19 with complaint of abdominal pain.  She had recently been discharged from the hospital on June 14 and diagnosed with diverticulitis and a urinary tract infection and orthostatic hypotension.  From there, she was discharged to a skilled nursing facility for rehabilitation purposes.  She returned with complaints of left lower quadrant and right upper quadrant abdominal pain with nausea.  No vomiting.  Because of this, we are asked to admit her for further evaluation.  HOSPITAL COURSE BY PROBLEM: 1. Abdominal pain.  We have found 2 separate sources for abdominal     pain.  (a) Left lower quadrant pain.  This is secondary to the     descending and sigmoid colon diverticulitis.  She was initially     kept n.p.o. and on clear liquids.  We have subsequently advanced     her diet.  She has been placed on Cipro, Flagyl which she will take     for a total of 21 days.  She is  now tolerating solid foods and is     doing well from this perspective.  (b) Right upper quadrant pain     which is presumed secondary to her choledocholithiasis.  Dr. Elnoria Howard     did perform an ERCP on June 22; however, the stones were very large     and he was not able to extract them.  He did place a stent for     drainage of the bile ducts.  Her bilirubin has started to decrease     as well as all of her LFTs which were now pretty much within normal     limits with the exception of still slightly elevated bili and alk     phos.  Following results of ERCP, surgery was consulted.  However,     Dr. Dwain Sarna believes that surgery would be a major undertaking in     this lady  with also possibility of bile duct reconstruction, so he     has discussed with Dr. Elnoria Howard and they have decided to at least try     the lithotripsy first and save surgery as a last measure.  This     ERCP with lithotripsy has been scheduled for July 6 at Dr. Haywood Pao     office. 2. History of transaminitis.  Again, this is secondary to her bile     duct obstruction because of stones.  She had a bili as high as 7.1     and this has decreased to 1.4 on discharge.  Her alk phos on     admission was 417 and this has decreased to 258.  AST and ALT have     now normalized. 3. Leukocytosis which is related most likely to her diverticulitis.     On day of discharge, her WBC count is 13,000 and rapidly     decreasing. 4. Rest of chronic conditions have been stable. 5. Vitals on day of discharge, blood pressure 125/60, heart rate 84,     respirations 20, sats of 94% on room air and temperature of 98.4.     Peggye Pitt, M.D.     EH/MEDQ  D:  01/17/2011  T:  01/17/2011  Job:  [id number]  cc:   Emeterio Reeve, MD Fax: [phone]  Juanetta Gosling, MD [address] [place] [address]  Anselmo Rod, MD, FACGFax: [phone]  Jordan Hawks. Elnoria Howard, MD Fax: [phone]  Electronically Signed by Peggye Pitt M.D. on 01/30/2011 08:10:25 AM

## 2011-02-02 ENCOUNTER — Other Ambulatory Visit: Payer: Self-pay | Admitting: *Deleted

## 2011-02-02 MED ORDER — METOPROLOL SUCCINATE ER 25 MG PO TB24: 25.0000 mg | ORAL_TABLET | Freq: Every day | ORAL | Status: DC

## 2011-02-02 NOTE — Telephone Encounter (Signed)
Fax received from pharmacy. Refill completed. Jodette Olney Monier RN  

## 2011-02-18 NOTE — Discharge Summary (Signed)
NAME:  Kimberly Rasmussen, Kimberly Rasmussen NO.:  [id number]  MEDICAL RECORD NO.:  [id number]  LOCATION:  1308                         FACILITY:  [facility]  PHYSICIAN:  Clydia Llano, MD       DATE OF BIRTH:  1926-01-07  DATE OF ADMISSION:  01/05/2011 DATE OF DISCHARGE:                        DISCHARGE SUMMARY - REFERRING   PRIMARY CARE PHYSICIAN:  Emeterio Reeve, MD  REASON FOR ADMISSION:  Sent from her primary care physician because of orthostatic hypotension.  DISCHARGE DIAGNOSES: 1. Escherichia coli urinary tract infection. 2. Mild acute colitis. 3. History of hypertension. 4. Hyperlipidemia. 5. Nonobstructive coronary artery disease. 6. Diverticulosis. 7. Vitamin D deficiency. 8. Shingles with neuralgia.  DISCHARGE MEDICATIONS: 1. Cefuroxime 500 mg p.o. b.i.d. for five more days. 2. Celebrex 200 mg p.o. daily. 3. Glucosamine  HCL 500 complex one cap p.o. daily. 4. Metronidazole 500 mg 3 times a day for several more days. 5. Multivitamin 1 tablet p.o. daily. 6. Nasonex 2 sprays nasally daily in each nostril. 7. Niacin 250 mg p.o. daily. 8. Simvastatin 20 mg p.o. daily at bedtime. 9. Skelaxin 800 mg p.o. daily. 10.Vitamin D3 OTC 1 capsule p.o. daily.  BRIEF HISTORY EXAMINATION:  Kimberly Rasmussen is an 77 year old African- American female with past medical history of hypertension, hyperlipidemia, and diverticulosis.  The patient was complaining about left quadrant abdominal pain 3 to 4 days prior to admission.  Associated with nausea and vomited once.  The patient had low-grade fever as well. History obtained from both the patient and the patient's daughter who was at bedside at time of admission.  The patient has also been weak and lethargic for the past 3-4 days prior to admission.  The patient was evaluated on the day of admission at her primary care physician's office where she was found to have orthostatic hypotension and  left lower quadrant abdominal pain.  The  patient was transferred to the hospital for further evaluation.  Upon further evaluation in the emergency department, CT scan showed mild colitis and UA showed UTI.  The patient admitted for further evaluation.  RADIOLOGY:  Chest x-ray showed cardiomegaly with low-volume basilar airspace disease, airspace disease and atelectasis is most compatible with  CHF, multifocal pneumonia is considered unlikely.  There is underlying emphysema.  CT abdomen and pelvis showed mild acute diverticulitis at the junction of the descending and the sigmoid colon. No evidence of abscess or microperforation.  There are 3 large gallstones within the extrahepatic bile duct with marked intra and extra hepatic biliary dilatation.  The stones are all in order of approximately 3 cm in diameter.  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  There is small hiatal hernia and small left inguinal hernia containing fat, emphysematous changes involving the right lung base, possible interstitial lung disease.  BRIEF HOSPITAL COURSE: 1. E-coli UTI.  The patient admitted to the hospital for further     evaluation.  Her UA was consistent with UTI.  The culture showed E-     coli which is resistant to fluoroquinolones.  The patient was on     Cipro.  At the hospital she spiked fever and that was switched to  ceftriaxone and at the time of discharge was switched to Ceftin.     The patient will have antibiotic for 5 days to be continued in the     skilled nursing facility. 2. Acute mild colitis per CT scan.  The patient was on Cipro and     Flagyl for that.  Because the patient was going to get Ceftin,     Cipro was discontinued and Flagyl will be continued for 7 more     days.  The patient does have mild colitis.  The patient is     tolerating food and no diarrhea or loose stools. 3. Hypertension.  Medications were continued throughout the  hospital     stay.  Blood pressure is nicely controlled. 4. Vitamin D deficiency.  Vitamin D was continued.  Levels were not     obtained but it is recommended to be obtained every 3 to 6 months. 5. Shingles with neuralgia.  No complaints during this hospital.  DISCHARGE INSTRUCTIONS:  DISPOSITION:  Skilled nursing facility.  DIET:  Heart-healthy diet.  ACTIVITY:  As tolerated.     Clydia Llano, MD     ME/MEDQ  D:  01/08/2011  T:  01/08/2011  Job:  [id number]  cc:   Emeterio Reeve, MD Fax: [phone]  Electronically Signed by Clydia Llano  on 02/18/2011 07:56:46 PM

## 2011-02-24 ENCOUNTER — Ambulatory Visit (HOSPITAL_COMMUNITY)
Admission: RE | Admit: 2011-02-24 | Discharge: 2011-02-24 | Disposition: A | Discharge status: Home / Self Care | Payer: Medicare Other | Source: Ambulatory Visit | Attending: Gastroenterology | Admitting: Gastroenterology

## 2011-02-24 DIAGNOSIS — I4949 Other premature depolarization: Secondary | ICD-10-CM | POA: Insufficient documentation

## 2011-02-24 DIAGNOSIS — R9431 Abnormal electrocardiogram [ECG] [EKG]: Secondary | ICD-10-CM | POA: Insufficient documentation

## 2011-02-27 ENCOUNTER — Ambulatory Visit (HOSPITAL_COMMUNITY)
Admission: RE | Admit: 2011-02-27 | Discharge: 2011-02-27 | Disposition: A | Discharge status: Home / Self Care | Payer: Medicare Other | Source: Ambulatory Visit | Attending: Gastroenterology | Admitting: Gastroenterology

## 2011-02-27 ENCOUNTER — Ambulatory Visit (HOSPITAL_COMMUNITY): Payer: Medicare Other

## 2011-02-27 DIAGNOSIS — M129 Arthropathy, unspecified: Secondary | ICD-10-CM | POA: Insufficient documentation

## 2011-02-27 DIAGNOSIS — I2510 Atherosclerotic heart disease of native coronary artery without angina pectoris: Secondary | ICD-10-CM | POA: Insufficient documentation

## 2011-02-27 DIAGNOSIS — K8050 Calculus of bile duct without cholangitis or cholecystitis without obstruction: Secondary | ICD-10-CM | POA: Insufficient documentation

## 2011-06-10 ENCOUNTER — Other Ambulatory Visit: Payer: Self-pay | Admitting: Orthopedic Surgery

## 2011-06-30 ENCOUNTER — Encounter (HOSPITAL_COMMUNITY): Payer: Self-pay | Admitting: Pharmacy Technician

## 2011-07-06 ENCOUNTER — Encounter (HOSPITAL_COMMUNITY): Payer: Self-pay

## 2011-07-06 ENCOUNTER — Encounter (HOSPITAL_COMMUNITY)
Admission: RE | Admit: 2011-07-06 | Discharge: 2011-07-06 | Disposition: A | Discharge status: Home / Self Care | Payer: Medicare Other | Source: Ambulatory Visit | Attending: Orthopedic Surgery | Admitting: Orthopedic Surgery

## 2011-07-06 ENCOUNTER — Other Ambulatory Visit: Payer: Self-pay | Admitting: Orthopedic Surgery

## 2011-07-06 ENCOUNTER — Encounter (HOSPITAL_COMMUNITY)
Admission: RE | Admit: 2011-07-06 | Discharge: 2011-07-06 | Payer: Medicare Other | Source: Ambulatory Visit | Attending: Orthopedic Surgery | Admitting: Orthopedic Surgery

## 2011-07-06 HISTORY — DX: Shortness of breath: R06.02

## 2011-07-06 HISTORY — DX: Other specified postprocedural states: Z98.890

## 2011-07-06 HISTORY — DX: Cardiac arrhythmia, unspecified: I49.9

## 2011-07-06 HISTORY — DX: Nausea with vomiting, unspecified: R11.2

## 2011-07-06 LAB — URINALYSIS, ROUTINE W REFLEX MICROSCOPIC
Ketones, ur: NEGATIVE mg/dL
Nitrite: NEGATIVE
Urobilinogen, UA: 1 mg/dL (ref 0.0–1.0)

## 2011-07-06 LAB — URINE MICROSCOPIC-ADD ON

## 2011-07-06 LAB — DIFFERENTIAL
Basophils Absolute: 0 10*3/uL (ref 0.0–0.1)
Basophils Relative: 0 % (ref 0–1)
Eosinophils Absolute: 0.3 10*3/uL (ref 0.0–0.7)
Neutrophils Relative %: 61 % (ref 43–77)

## 2011-07-06 LAB — TYPE AND SCREEN: Antibody Screen: NEGATIVE

## 2011-07-06 LAB — PROTIME-INR
INR: 1 (ref 0.00–1.49)
Prothrombin Time: 13.4 seconds (ref 11.6–15.2)

## 2011-07-06 LAB — CBC
MCH: 28.4 pg (ref 26.0–34.0)
MCHC: 31.7 g/dL (ref 30.0–36.0)
Platelets: 340 10*3/uL (ref 150–400)

## 2011-07-06 LAB — BASIC METABOLIC PANEL
BUN: 15 mg/dL (ref 6–23)
Calcium: 10.2 mg/dL (ref 8.4–10.5)
GFR calc non Af Amer: 79 mL/min — ABNORMAL LOW (ref >90)
Glucose, Bld: 54 mg/dL — ABNORMAL LOW (ref 70–99)
Sodium: 144 mEq/L (ref 135–145)

## 2011-07-06 LAB — ABO/RH: ABO/RH(D): O POS

## 2011-07-06 MED ORDER — CHLORHEXIDINE GLUCONATE 4 % EX LIQD: 60.0000 mL | Freq: Once | CUTANEOUS | Status: DC

## 2011-07-06 NOTE — Pre-Procedure Instructions (Signed)
20 Kimberly Rasmussen  07/06/2011   Your procedure is scheduled on:  07-13-11 Monday  Report to [facility] at 8:30 AM.  Call this number if you have problems the morning of surgery: [phone]   Remember:   Do not eat food:After Midnight.  May have clear liquids: up to 4 Hours before arrival.  Clear liquids include soda, tea, black coffee, apple or grape juice, broth.  Take these medicines the morning of surgery with A SIP OF WATER: None   Do not wear jewelry, make-up or nail polish.  Do not wear lotions, powders, or perfumes. You may wear deodorant.  Do not shave 48 hours prior to surgery.  Do not bring valuables to the hospital.  Contacts, dentures or bridgework may not be worn into surgery.  Leave suitcase in the car. After surgery it may be brought to your room.  For patients admitted to the hospital, checkout time is 11:00 AM the day of discharge.   Patients discharged the day of surgery will not be allowed to drive home.  Name and phone number of your driver: Bonita Quin - daughter [phone]  Special Instructions: Incentive Spirometry - Practice and bring it with you on the day of surgery. and CHG Shower Use Special Wash: 1/2 bottle night before surgery and 1/2 bottle morning of surgery.   Please read over the following fact sheets that you were given: Pain Booklet, Coughing and Deep Breathing, Blood Transfusion Information, MRSA Information and Surgical Site Infection Prevention

## 2011-07-07 ENCOUNTER — Encounter (HOSPITAL_COMMUNITY): Payer: Self-pay | Admitting: Vascular Surgery

## 2011-07-07 NOTE — Consult Note (Signed)
Anesthesia:  Patient is an 77 year old female scheduled for a left TKA with hardware removal on 07/13/11.  Her history includes HTN, dyslipidemia, diverticulitis, OA, former smoker.  Her nursing assessment also mentions a history of hepatitis or jaundice.  I spoke with Ms. William's and her daughter who denied any hx of infectious Hepatitis.  She did however develop jaundice related to common bile duct stones in June of this year and was evaluated by Dr. Dwain Sarna and Dr. Elnoria Howard.  She is S/P ERCP, lithotripsy, and removal of stones in July and August of this year.  Her AST and ALT were normal on 01/16/11.  Her total bilirubin was mildly elevated at 1.4.  I reviewed her preoperative labs from yesterday.  Her glucose was low at 54.  I will order a CBG on arrival to Short Stay since she will be NPO and is not a first case.  I will also check a HFP since these have not been rechecked since her GI procedures.  Her CXR and EKG were also reviewed.  CXR shows possible emphysema, but no acute process.  Her EKG from 03/12/11 show ST.  HR at PAT is now 80.  She has seen Dr. Elease Hashimoto on 10/23/10 for HTN and HLD f/u, but after that appointment he felt she could f/u with Cardiology on a PRN basis.  She had a cath in 2006 showing non-obstructive coronary arteries with mild luminal irregularities, EF 55-60%, otherwise no recent Cardiac studies.  Plan to proceed.

## 2011-07-10 NOTE — H&P (Signed)
  HISTORY OF PRESENT ILLNESS:  Last seen about a year ago for end stage arthritis of her left knee with valgus deformity and a right total knee that was put in 15 or 16 years ago with an unknown design that continues to do well.  In any event, she comes in today because of progressive valgus deformity of her left knee and increasing pain.  She has already had Supartz injections in the past that helped some, but her pain is increasing and it is to the point where she is almost falling down because of left knee pain.  PAST MEDICAL HISTORY:  Significant for high blood pressure, hyperlipidemia. MEDICATIONS:  Hydrochlorothiazide 100/25, simvastatin 20 mg, metoprolol 25 mg, Niacin 250 mg, glucosamine chondroitin nitro 0.2 mg, and Aleve. ALLERGIES:  NKDA. HOSPITALIZATIONS:  Left knee surgery 20 years ago and right knee replacement 15 years ago. PAST SURGICAL HISTORY:  As above. REVIEW OF SYSTEMS:  She wears glasses.  She has sleep disorder.  All other systems negative. FAMILY MEDICAL HISTORY:  Significant for high blood pressure and arthritis. SOCIAL HISTORY:  She is retired.  She is widowed.  She denies alcohol and tobacco use.  PHYSICAL EXAMINATION:  She appears healthy, no acute distress, alert and oriented.  Height 5 feet and 5 inches and weight 160 pounds.  She does have some progression of her valgus deformity, which is up to about 15 degrees.  She can come to full extension, she flexes to 120, she is quite tender along the lateral joint line. The incision over the patella on the left side which is from an open reduction internal fixation is well- Healed.  RADIOGRAPHS:  X-rays were ordered, performed, and interpreted by me today and show progressive destruction of the lateral joint with some erosion of the lateral tibial plateau.  The patellofemoral joint appears to be in a relatively good condition, but the screws are totally encased in bone and will be a challenge to get out if she elects to have a  knee replacement.  Radiographs of the right total knee show well-laced, well-fixed prostheses with no overt evidence of loosening.  On the right side her range of motion is 0 to about 115 degrees.  IMPRESSION:  Severe end stage arthritis left knee with valgus deformity, but no flexion contracture. On the right side she has a stable, well-placed, well-functioning total knee placed 15 or 16 years ago with an unknown design, it was placed by another physician in another location.  PLAN: At this point, Kimberly Rasmussen has failed all conservative measures, so she is a candidate for left total knee arthroplasty.  Risks and benefits of surgery were discussed.

## 2011-07-12 MED ORDER — CEFAZOLIN SODIUM 1-5 GM-% IV SOLN
1.0000 g | INTRAVENOUS | Status: AC
  Administered 2011-07-13: 1 g via INTRAVENOUS
  Filled 2011-07-12: qty 50

## 2011-07-13 ENCOUNTER — Encounter (HOSPITAL_COMMUNITY): Payer: Self-pay | Admitting: Orthopedic Surgery

## 2011-07-13 ENCOUNTER — Encounter (HOSPITAL_COMMUNITY)
Admission: RE | Disposition: A | Discharge status: Skilled Nursing Facility | Payer: Self-pay | Source: Ambulatory Visit | Attending: Orthopedic Surgery

## 2011-07-13 ENCOUNTER — Inpatient Hospital Stay (HOSPITAL_COMMUNITY): Payer: Medicare Other | Admitting: Vascular Surgery

## 2011-07-13 ENCOUNTER — Inpatient Hospital Stay (HOSPITAL_COMMUNITY): Payer: Medicare Other

## 2011-07-13 ENCOUNTER — Encounter (HOSPITAL_COMMUNITY): Payer: Self-pay | Admitting: Vascular Surgery

## 2011-07-13 ENCOUNTER — Inpatient Hospital Stay (HOSPITAL_COMMUNITY)
Admission: RE | Admit: 2011-07-13 | Discharge: 2011-07-16 | DRG: 470 | Disposition: A | Discharge status: Skilled Nursing Facility | Payer: Medicare Other | Source: Ambulatory Visit | Attending: Orthopedic Surgery | Admitting: Orthopedic Surgery

## 2011-07-13 DIAGNOSIS — M1710 Unilateral primary osteoarthritis, unspecified knee: Principal | ICD-10-CM | POA: Diagnosis present

## 2011-07-13 DIAGNOSIS — I10 Essential (primary) hypertension: Secondary | ICD-10-CM | POA: Diagnosis present

## 2011-07-13 DIAGNOSIS — M1712 Unilateral primary osteoarthritis, left knee: Secondary | ICD-10-CM | POA: Diagnosis present

## 2011-07-13 DIAGNOSIS — Z79899 Other long term (current) drug therapy: Secondary | ICD-10-CM

## 2011-07-13 DIAGNOSIS — E785 Hyperlipidemia, unspecified: Secondary | ICD-10-CM | POA: Diagnosis present

## 2011-07-13 DIAGNOSIS — Z96659 Presence of unspecified artificial knee joint: Secondary | ICD-10-CM

## 2011-07-13 DIAGNOSIS — Z472 Encounter for removal of internal fixation device: Secondary | ICD-10-CM

## 2011-07-13 HISTORY — PX: TOTAL KNEE ARTHROPLASTY: SHX125

## 2011-07-13 LAB — HEPATIC FUNCTION PANEL
AST: 22 U/L (ref 0–37)
Albumin: 3.3 g/dL — ABNORMAL LOW (ref 3.5–5.2)
Bilirubin, Direct: <0.1 mg/dL (ref 0.0–0.3)

## 2011-07-13 SURGERY — ARTHROPLASTY, KNEE, TOTAL
Anesthesia: Regional | Site: Knee | Laterality: Left | Wound class: Clean

## 2011-07-13 MED ORDER — ACETAMINOPHEN 650 MG RE SUPP: 650.0000 mg | Freq: Four times a day (QID) | RECTAL | Status: DC | PRN

## 2011-07-13 MED ORDER — GLYCOPYRROLATE 0.2 MG/ML IJ SOLN
INTRAMUSCULAR | Status: DC | PRN
  Administered 2011-07-13: .4 mg via INTRAVENOUS

## 2011-07-13 MED ORDER — PHENOL 1.4 % MT LIQD
1.0000 | OROMUCOSAL | Status: DC | PRN
  Filled 2011-07-13: qty 177

## 2011-07-13 MED ORDER — METOCLOPRAMIDE HCL 5 MG/ML IJ SOLN
5.0000 mg | Freq: Three times a day (TID) | INTRAMUSCULAR | Status: DC | PRN
  Filled 2011-07-13: qty 2

## 2011-07-13 MED ORDER — ACETAMINOPHEN 10 MG/ML IV SOLN
INTRAVENOUS | Status: DC | PRN
  Administered 2011-07-13: 1000 mg via INTRAVENOUS

## 2011-07-13 MED ORDER — ALUM & MAG HYDROXIDE-SIMETH 200-200-20 MG/5ML PO SUSP: 30.0000 mL | ORAL | Status: DC | PRN

## 2011-07-13 MED ORDER — ENOXAPARIN SODIUM 30 MG/0.3ML ~~LOC~~ SOLN
30.0000 mg | Freq: Two times a day (BID) | SUBCUTANEOUS | Status: DC
  Administered 2011-07-14 – 2011-07-16 (×4): 30 mg via SUBCUTANEOUS
  Filled 2011-07-13 (×6): qty 0.3

## 2011-07-13 MED ORDER — CEFUROXIME SODIUM 1.5 G IJ SOLR
INTRAMUSCULAR | Status: DC | PRN
  Administered 2011-07-13: 1.5 g

## 2011-07-13 MED ORDER — DEXTROSE 5 % IV SOLN
INTRAVENOUS | Status: DC | PRN
  Administered 2011-07-13: 11:00:00 via INTRAVENOUS

## 2011-07-13 MED ORDER — WARFARIN VIDEO: Freq: Once | Status: DC

## 2011-07-13 MED ORDER — HYDROMORPHONE HCL PF 1 MG/ML IJ SOLN
0.5000 mg | INTRAMUSCULAR | Status: DC | PRN
  Filled 2011-07-13: qty 1

## 2011-07-13 MED ORDER — DEXTROSE-NACL 5-0.45 % IV SOLN: INTRAVENOUS | Status: DC

## 2011-07-13 MED ORDER — METAXALONE 800 MG PO TABS
800.0000 mg | ORAL_TABLET | Freq: Three times a day (TID) | ORAL | Status: DC
  Filled 2011-07-13 (×5): qty 1

## 2011-07-13 MED ORDER — METOPROLOL SUCCINATE ER 25 MG PO TB24
25.0000 mg | ORAL_TABLET | Freq: Every day | ORAL | Status: DC
  Filled 2011-07-13 (×2): qty 1

## 2011-07-13 MED ORDER — MEPERIDINE HCL 25 MG/ML IJ SOLN: 6.2500 mg | INTRAMUSCULAR | Status: DC | PRN

## 2011-07-13 MED ORDER — LABETALOL HCL 5 MG/ML IV SOLN
INTRAVENOUS | Status: DC | PRN
  Administered 2011-07-13: 5 mg via INTRAVENOUS

## 2011-07-13 MED ORDER — ONDANSETRON HCL 4 MG PO TABS: 4.0000 mg | ORAL_TABLET | Freq: Four times a day (QID) | ORAL | Status: DC | PRN

## 2011-07-13 MED ORDER — FLEET ENEMA 7-19 GM/118ML RE ENEM: 1.0000 | ENEMA | Freq: Once | RECTAL | Status: AC | PRN

## 2011-07-13 MED ORDER — ROCURONIUM BROMIDE 100 MG/10ML IV SOLN
INTRAVENOUS | Status: DC | PRN
  Administered 2011-07-13: 50 mg via INTRAVENOUS

## 2011-07-13 MED ORDER — BUPIVACAINE-EPINEPHRINE PF 0.5-1:200000 % IJ SOLN
INTRAMUSCULAR | Status: DC | PRN
  Administered 2011-07-13: 30 mL

## 2011-07-13 MED ORDER — HYDROCODONE-ACETAMINOPHEN 5-325 MG PO TABS: 1.0000 | ORAL_TABLET | ORAL | Status: DC | PRN

## 2011-07-13 MED ORDER — MENTHOL 3 MG MT LOZG: 1.0000 | LOZENGE | OROMUCOSAL | Status: DC | PRN

## 2011-07-13 MED ORDER — NEOSTIGMINE METHYLSULFATE 1 MG/ML IJ SOLN
INTRAMUSCULAR | Status: DC | PRN
  Administered 2011-07-13: 3 mg via INTRAVENOUS

## 2011-07-13 MED ORDER — CELECOXIB 200 MG PO CAPS
200.0000 mg | ORAL_CAPSULE | Freq: Every day | ORAL | Status: DC
  Administered 2011-07-13 – 2011-07-16 (×3): 200 mg via ORAL
  Filled 2011-07-13 (×4): qty 1

## 2011-07-13 MED ORDER — ACETAMINOPHEN 325 MG PO TABS: 650.0000 mg | ORAL_TABLET | Freq: Four times a day (QID) | ORAL | Status: DC | PRN

## 2011-07-13 MED ORDER — LACTATED RINGERS IV SOLN
INTRAVENOUS | Status: DC | PRN
  Administered 2011-07-13 (×2): via INTRAVENOUS

## 2011-07-13 MED ORDER — FENTANYL CITRATE 0.05 MG/ML IJ SOLN
INTRAMUSCULAR | Status: DC | PRN
  Administered 2011-07-13: 100 ug via INTRAVENOUS
  Administered 2011-07-13: 50 ug via INTRAVENOUS
  Administered 2011-07-13 (×2): 100 ug via INTRAVENOUS
  Administered 2011-07-13: 25 ug via INTRAVENOUS

## 2011-07-13 MED ORDER — ONDANSETRON HCL 4 MG/2ML IJ SOLN: 4.0000 mg | Freq: Once | INTRAMUSCULAR | Status: DC | PRN

## 2011-07-13 MED ORDER — METOCLOPRAMIDE HCL 10 MG PO TABS: 5.0000 mg | ORAL_TABLET | Freq: Three times a day (TID) | ORAL | Status: DC | PRN

## 2011-07-13 MED ORDER — KCL IN DEXTROSE-NACL 20-5-0.45 MEQ/L-%-% IV SOLN
INTRAVENOUS | Status: AC
  Filled 2011-07-13: qty 1000

## 2011-07-13 MED ORDER — ONDANSETRON HCL 4 MG/2ML IJ SOLN
4.0000 mg | Freq: Four times a day (QID) | INTRAMUSCULAR | Status: DC | PRN
  Administered 2011-07-14: 4 mg via INTRAVENOUS
  Filled 2011-07-13 (×2): qty 2

## 2011-07-13 MED ORDER — HYDROMORPHONE HCL PF 1 MG/ML IJ SOLN
0.2500 mg | INTRAMUSCULAR | Status: DC | PRN
  Administered 2011-07-13 (×2): 0.5 mg via INTRAVENOUS

## 2011-07-13 MED ORDER — CHLORHEXIDINE GLUCONATE 4 % EX LIQD: 60.0000 mL | Freq: Once | CUTANEOUS | Status: DC

## 2011-07-13 MED ORDER — WARFARIN SODIUM 5 MG PO TABS
5.0000 mg | ORAL_TABLET | Freq: Once | ORAL | Status: AC
  Administered 2011-07-13: 5 mg via ORAL
  Filled 2011-07-13: qty 1

## 2011-07-13 MED ORDER — BISACODYL 5 MG PO TBEC: 5.0000 mg | DELAYED_RELEASE_TABLET | Freq: Every day | ORAL | Status: DC | PRN

## 2011-07-13 MED ORDER — MORPHINE SULFATE 2 MG/ML IJ SOLN: 0.0500 mg/kg | INTRAMUSCULAR | Status: DC | PRN

## 2011-07-13 MED ORDER — DIPHENHYDRAMINE HCL 12.5 MG/5ML PO ELIX
12.5000 mg | ORAL_SOLUTION | ORAL | Status: DC | PRN
  Filled 2011-07-13: qty 10

## 2011-07-13 MED ORDER — NIACIN 500 MG PO TABS
500.0000 mg | ORAL_TABLET | Freq: Every day | ORAL | Status: DC
  Administered 2011-07-13 – 2011-07-15 (×3): 500 mg via ORAL
  Filled 2011-07-13 (×4): qty 1

## 2011-07-13 MED ORDER — OXYCODONE-ACETAMINOPHEN 5-325 MG PO TABS
1.0000 | ORAL_TABLET | ORAL | Status: DC | PRN
  Administered 2011-07-14 – 2011-07-15 (×4): 2 via ORAL
  Filled 2011-07-13 (×4): qty 2

## 2011-07-13 MED ORDER — MAGNESIUM HYDROXIDE 400 MG/5ML PO SUSP: 30.0000 mL | Freq: Every day | ORAL | Status: DC | PRN

## 2011-07-13 MED ORDER — ZOLPIDEM TARTRATE 5 MG PO TABS: 5.0000 mg | ORAL_TABLET | Freq: Every evening | ORAL | Status: DC | PRN

## 2011-07-13 MED ORDER — SODIUM CHLORIDE 0.9 % IR SOLN
Status: DC | PRN
  Administered 2011-07-13: 1

## 2011-07-13 MED ORDER — PROPOFOL 10 MG/ML IV EMUL
INTRAVENOUS | Status: DC | PRN
  Administered 2011-07-13: 100 mg via INTRAVENOUS

## 2011-07-13 MED ORDER — PATIENT'S GUIDE TO USING COUMADIN BOOK
Freq: Once | Status: DC
  Filled 2011-07-13: qty 1

## 2011-07-13 MED ORDER — KCL IN DEXTROSE-NACL 20-5-0.45 MEQ/L-%-% IV SOLN
INTRAVENOUS | Status: DC
  Administered 2011-07-13: 17:00:00 via INTRAVENOUS
  Filled 2011-07-13 (×9): qty 1000

## 2011-07-13 MED ORDER — SIMVASTATIN 20 MG PO TABS
20.0000 mg | ORAL_TABLET | Freq: Every day | ORAL | Status: DC
  Administered 2011-07-13: 20 mg via ORAL
  Filled 2011-07-13 (×2): qty 1

## 2011-07-13 MED ORDER — LACTATED RINGERS IV SOLN
INTRAVENOUS | Status: DC
  Administered 2011-07-13: 11:00:00 via INTRAVENOUS

## 2011-07-13 MED ORDER — ONDANSETRON HCL 4 MG/2ML IJ SOLN
INTRAMUSCULAR | Status: DC | PRN
  Administered 2011-07-13: 4 mg via INTRAVENOUS

## 2011-07-13 SURGICAL SUPPLY — 58 items
BANDAGE ESMARK 6X9 LF (GAUZE/BANDAGES/DRESSINGS) ×1 IMPLANT
BLADE SAG 18X100X1.27 (BLADE) ×2 IMPLANT
BLADE SAW SGTL 13X75X1.27 (BLADE) ×2 IMPLANT
BLADE SURG ROTATE 9660 (MISCELLANEOUS) IMPLANT
BNDG ELASTIC 6X10 VLCR STRL LF (GAUZE/BANDAGES/DRESSINGS) ×2 IMPLANT
BNDG ESMARK 6X9 LF (GAUZE/BANDAGES/DRESSINGS) ×2
BOWL SMART MIX CTS (DISPOSABLE) ×2 IMPLANT
CEMENT HV SMART SET (Cement) ×4 IMPLANT
CLOTH BEACON ORANGE TIMEOUT ST (SAFETY) ×2 IMPLANT
COVER BACK TABLE 24X17X13 BIG (DRAPES) IMPLANT
COVER SURGICAL LIGHT HANDLE (MISCELLANEOUS) ×2 IMPLANT
CUFF TOURNIQUET SINGLE 34IN LL (TOURNIQUET CUFF) ×2 IMPLANT
CUFF TOURNIQUET SINGLE 44IN (TOURNIQUET CUFF) IMPLANT
DRAPE EXTREMITY T 121X128X90 (DRAPE) ×2 IMPLANT
DRAPE U-SHAPE 47X51 STRL (DRAPES) ×2 IMPLANT
DURAPREP 26ML APPLICATOR (WOUND CARE) ×2 IMPLANT
ELECT REM PT RETURN 9FT ADLT (ELECTROSURGICAL) ×2
ELECTRODE REM PT RTRN 9FT ADLT (ELECTROSURGICAL) ×1 IMPLANT
EVACUATOR 1/8 PVC DRAIN (DRAIN) ×2 IMPLANT
GAUZE SPONGE 4X4 12PLY STRL LF (GAUZE/BANDAGES/DRESSINGS) ×2 IMPLANT
GAUZE XEROFORM 1X8 LF (GAUZE/BANDAGES/DRESSINGS) ×2 IMPLANT
GLOVE BIO SURGEON STRL SZ7 (GLOVE) ×2 IMPLANT
GLOVE BIO SURGEON STRL SZ7.5 (GLOVE) ×2 IMPLANT
GLOVE BIOGEL PI IND STRL 7.0 (GLOVE) ×2 IMPLANT
GLOVE BIOGEL PI IND STRL 7.5 (GLOVE) ×1 IMPLANT
GLOVE BIOGEL PI IND STRL 8 (GLOVE) ×1 IMPLANT
GLOVE BIOGEL PI INDICATOR 7.0 (GLOVE) ×2
GLOVE BIOGEL PI INDICATOR 7.5 (GLOVE) ×1
GLOVE BIOGEL PI INDICATOR 8 (GLOVE) ×1
GLOVE SURG ORTHO 7.0 STRL STRW (GLOVE) ×2 IMPLANT
GLOVE SURG SS PI 6.5 STRL IVOR (GLOVE) ×2 IMPLANT
GOWN PREVENTION PLUS XLARGE (GOWN DISPOSABLE) ×4 IMPLANT
GOWN STRL NON-REIN LRG LVL3 (GOWN DISPOSABLE) ×4 IMPLANT
HANDPIECE INTERPULSE COAX TIP (DISPOSABLE) ×1
HOOD PEEL AWAY FACE SHEILD DIS (HOOD) ×6 IMPLANT
KIT BASIN OR (CUSTOM PROCEDURE TRAY) ×2 IMPLANT
KIT ROOM TURNOVER OR (KITS) ×2 IMPLANT
MANIFOLD NEPTUNE II (INSTRUMENTS) ×2 IMPLANT
NS IRRIG 1000ML POUR BTL (IV SOLUTION) ×2 IMPLANT
PACK TOTAL JOINT (CUSTOM PROCEDURE TRAY) ×2 IMPLANT
PAD ARMBOARD 7.5X6 YLW CONV (MISCELLANEOUS) ×2 IMPLANT
PADDING CAST COTTON 6X4 STRL (CAST SUPPLIES) ×2 IMPLANT
PADDING WEBRIL 6 STERILE (GAUZE/BANDAGES/DRESSINGS) ×2 IMPLANT
SET HNDPC FAN SPRY TIP SCT (DISPOSABLE) ×1 IMPLANT
SPONGE GAUZE 4X4 12PLY (GAUZE/BANDAGES/DRESSINGS) ×2 IMPLANT
STAPLER VISISTAT 35W (STAPLE) ×2 IMPLANT
SUCTION FRAZIER TIP 10 FR DISP (SUCTIONS) ×2 IMPLANT
SURGIFLO TRUKIT (HEMOSTASIS) IMPLANT
SUT VIC AB 0 CTX 36 (SUTURE) ×1
SUT VIC AB 0 CTX36XBRD ANTBCTR (SUTURE) ×1 IMPLANT
SUT VIC AB 1 CTX 36 (SUTURE) ×1
SUT VIC AB 1 CTX36XBRD ANBCTR (SUTURE) ×1 IMPLANT
SUT VIC AB 2-0 CT1 27 (SUTURE) ×1
SUT VIC AB 2-0 CT1 TAPERPNT 27 (SUTURE) ×1 IMPLANT
TOWEL OR 17X24 6PK STRL BLUE (TOWEL DISPOSABLE) ×2 IMPLANT
TOWEL OR 17X26 10 PK STRL BLUE (TOWEL DISPOSABLE) ×2 IMPLANT
TRAY FOLEY CATH 14FR (SET/KITS/TRAYS/PACK) ×2 IMPLANT
WATER STERILE IRR 1000ML POUR (IV SOLUTION) ×6 IMPLANT

## 2011-07-13 NOTE — Progress Notes (Signed)
CBG 81 t 0950

## 2011-07-13 NOTE — Interval H&P Note (Signed)
History and Physical Interval Note:  07/13/2011 10:05 AM  Kimberly Rasmussen  has presented today for surgery, with the diagnosis of OSTEOARTHRITIS LEFT KNEE W/ RETAINED SCREWS  The various methods of treatment have been discussed with the patient and family. After consideration of risks, benefits and other options for treatment, the patient has consented to  Procedure(s): TOTAL KNEE ARTHROPLASTY as a surgical intervention .  The patients' history has been reviewed, patient examined, no change in status, stable for surgery.  I have reviewed the patients' chart and labs.  Questions were answered to the patient's satisfaction.     Nestor Lewandowsky

## 2011-07-13 NOTE — Anesthesia Postprocedure Evaluation (Signed)
  Anesthesia Post-op Note  Patient: Kimberly Rasmussen  Procedure(s) Performed:  TOTAL KNEE ARTHROPLASTY - Left Total Knee Arthroplasty with Hardware Removal   Patient Location: PACU  Anesthesia Type: GA combined with regional for post-op pain  Level of Consciousness: awake  Airway and Oxygen Therapy: Patient connected to nasal cannula oxygen  Post-op Pain: mild  Post-op Assessment: Post-op Vital signs reviewed, Patient's Cardiovascular Status Stable, Respiratory Function Stable, Patent Airway, No signs of Nausea or vomiting and Pain level controlled  Post-op Vital Signs: Reviewed and stable  Complications: No apparent anesthesia complications

## 2011-07-13 NOTE — Transfer of Care (Signed)
Immediate Anesthesia Transfer of Care Note  Patient: Kimberly Rasmussen  Procedure(s) Performed:  TOTAL KNEE ARTHROPLASTY - Left Total Knee Arthroplasty with Hardware Removal   Patient Location: PACU  Anesthesia Type: General  Level of Consciousness: awake, alert  and oriented  Airway & Oxygen Therapy: Patient Spontanous Breathing and Patient connected to nasal cannula oxygen  Post-op Assessment: Report given to PACU RN and Post -op Vital signs reviewed and stable  Post vital signs: Reviewed and stable  Complications: No apparent anesthesia complications

## 2011-07-13 NOTE — Op Note (Signed)
PATIENT ID:      Buffie Herne  MRN:     [id number] DOB/AGE:    [birth year] / 77 y.o.       OPERATIVE REPORT    DATE OF PROCEDURE:  07/13/2011       PREOPERATIVE DIAGNOSIS:   OSTEOARTHRITIS LEFT KNEE W/ RETAINED SCREWS      There is no height on file to calculate BMI.                                                        POSTOPERATIVE DIAGNOSIS:   OSTEOARTHRITIS LEFT KNEE W/ RETAINED SCREWS                                                                      PROCEDURE:  Procedure(s): TOTAL KNEE ARTHROPLASTY Using Depuy Sigma RP implants #3 L Femur, #4Tibia, 10mm sigma RP bearing, 38 Patella, remove screw     SURGEON: UXLKG,MWNUU J    ASSISTANT:   Shirl Harris PA-C   (Present and scrubbed throughout the case, critical for assistance with exposure, retraction, instrumentation, and closure.)         ANESTHESIA: GA combined with regional for post-op pain   DRAINS: (2) Hemovact drain(s) in the L knee with  Suction Open and Urinary Catheter (Foley)   TOURNIQUET TIME:  Total Tourniquet Time Documented: area (laterality) - 82 minutes    COMPLICATIONS:  None     SPECIMENS: None   INDICATIONS FOR PROCEDURE: The patient has  OSTEOARTHRITIS LEFT KNEE W/ RETAINED SCREWS, valgus deformities, XR shows bone on bone arthritis. Patient has failed all conservative measures including anti-inflammatory medicines, narcotics, attempts at  exercise and weight loss, cortisone injections and viscosupplementation.  Risks and benefits of surgery have been discussed, questions answered.   DESCRIPTION OF PROCEDURE: The patient identified by armband, received  right femoral nerve block and IV antibiotics, in the holding area at [facility]. Patient taken to the operating room, appropriate anesthetic  monitors were attached General endotracheal anesthesia induced with  the patient in supine position, Foley catheter was inserted. Tourniquet  applied high to the operative thigh. Lateral post and  foot positioner  applied to the table, the lower extremity was then prepped and draped  in usual sterile fashion from the ankle to the tourniquet. Time-out procedure was performed. The limb was wrapped with an Esmarch bandage and the tourniquet inflated to 350 mmHg. We began the operation by making the anterior midline incision starting at  handbreadth above the patella going over the patella 1 cm medial to and  4 cm distal to the tibial tubercle. Small bleeders in the skin and the  subcutaneous tissue identified and cauterized. Transverse retinaculum was incised and reflected medially and a medial parapatellar arthrotomy was accomplished. the patella was everted and theprepatellar fat pad resected. The retained medial patellar screw was removed. The superficial medial collateral  ligament was then elevated from anterior to posterior along the proximal  flare of the tibia and anterior half of the menisci resected. The knee was hyperflexed exposing bone on bone arthritis.  Peripheral and notch osteophytes as well as the cruciate ligaments were then resected. We continued to  work our way around posteriorly along the proximal tibia, and externally  rotated the tibia subluxing it out from underneath the femur. A McHale  retractor was placed through the notch and a lateral Hohmann retractor  placed, and we then drilled through the proximal tibia in line with the  axis of the tibia followed by an intramedullary guide rod and 2-degree  posterior slope cutting guide. The tibial cutting guide was pinned into place  allowing resection of 6 mm of bone medially and about 4 mm of bone  laterally because of her valgus deformity. Satisfied with the tibial resection, we then  entered the distal femur 2 mm anterior to the PCL origin with the  intramedullary guide rod and applied the distal femoral cutting guide  set at 11mm, with 5 degrees of valgus. This was pinned along the  epicondylar axis. At this point, the  distal femoral cut was accomplished without difficulty. We then sized for a #3 femoral component and pinned the guide in 0 degrees of external rotation.The chamfer cutting guide was pinned into place. The anterior, posterior, and chamfer cuts were accomplished without difficulty followed by  the Sigma RP box cutting guide and the box cut. We also removed posterior osteophytes from the posterior femoral condyles. At this  time, the knee was brought into full extension. We checked our  extension and flexion gaps and found them symmetric at 10mm.  The patella thickness measured at 22 mm. We felt a 38 patella would  fit. We set the cutting guide at 15 and removed the posterior 9.5-10 mm  of the patella sized for 38 button and drilled the lollipop. The knee  was then once again hyperflexed exposing the proximal tibia. We sized for a #4 tibial base plate, applied the smokestack and the conical reamer followed by the the Delta fin keel punch. We then hammered into place the Sigma RP trial femoral component, inserted a 10-mm trial bearing, trial patellar button, and took the knee through range of motion from 0-130 degrees. No thumb pressure was required for patellar  tracking. At this point, all trial components were removed, a double batch of DePuy HV cement with 1500 mg of Zinacef was mixed and applied to all bony metallic mating surfaces except for the posterior condyles of the femur itself. In order, we  hammered into place the tibial tray and removed excess cement, the femoral component and removed excess cement, a 10-mm Sigma RP bearing  was inserted, and the knee brought to full extension with compression.  The patellar button was clamped into place, and excess cement  removed. While the cement cured the wound was irrigated out with normal saline solution pulse lavage, and medium Hemovac drains were placed from an anterolateral  approach. Ligament stability and patellar tracking were checked and found to  be excellent. The parapatellar arthrotomy was closed with  running #1 Vicryl suture. The subcutaneous tissue with 0 and 2-0 undyed  Vicryl suture, and the skin with skin staples. A dressing of Xeroform,  4 x 4, dressing sponges, Webril, and Ace wrap applied. The patient  awakened, extubated, and taken to recovery room without difficulty.   Nestor Lewandowsky 07/13/2011, 12:43 PM

## 2011-07-13 NOTE — Preoperative (Signed)
Beta Blockers   Reason not to administer Beta Blockers:Dr. took pt off Metoprolol b/c "I didn't need it".

## 2011-07-13 NOTE — Anesthesia Procedure Notes (Addendum)
Anesthesia Regional Block:   Narrative:    Anesthesia Regional Block:  Femoral nerve block  Pre-Anesthetic Checklist: ,, timeout performed, Correct Patient, Correct Site, Correct Laterality, Correct Procedure, Correct Position, site marked, Risks and benefits discussed,  Surgical consent,  Pre-op evaluation,  At surgeon's request and post-op pain management  Laterality: Left and Lower  Prep: chloraprep       Needles:  Injection technique: Single-shot  Needle Type: Echogenic Needle     Needle Length: 9cm  Needle Gauge: 22 and 22 G    Additional Needles:  Procedures: ultrasound guided Femoral nerve block Narrative:  Start time: 07/13/2011 10:20 AM End time: 07/13/2011 10:30 AM  Performed by: Personally  Anesthesiologist: Lonna Duval MD   Procedure Name: Intubation Date/Time: 07/13/2011 10:50 AM Performed by: Tyrone Nine Pre-anesthesia Checklist: Patient identified, Emergency Drugs available, Suction available and Patient being monitored Patient Re-evaluated:Patient Re-evaluated prior to inductionOxygen Delivery Method: Circle System Utilized Preoxygenation: Pre-oxygenation with 100% oxygen Intubation Type: IV induction Ventilation: Mask ventilation without difficulty Laryngoscope Size: Mac and 3 Grade View: Grade I Tube type: Oral Tube size: 7.5 mm Number of attempts: 1 Placement Confirmation: ETT inserted through vocal cords under direct vision,  breath sounds checked- equal and bilateral and positive ETCO2 Secured at: 22 cm Tube secured with: Tape Dental Injury: Teeth and Oropharynx as per pre-operative assessment

## 2011-07-13 NOTE — Progress Notes (Signed)
ANTICOAGULATION CONSULT NOTE - Initial Consult  Pharmacy Consult for Coumadin Indication: VTE prophylaxis  Allergies  Allergen Reactions  . Amitriptyline   . Gabapentin   . Meloxicam   . Tramadol     Patient Measurements:   Weight: 69kg  Vital Signs: Temp: 98 F (36.7 C) (12/17 1545) Temp src: Oral (12/17 0931) BP: 132/84 mmHg (12/17 0931) Pulse Rate: 65  (12/17 0931)  Labs: No results found for this basename: HGB:2,HCT:3,PLT:3,APTT:3,LABPROT:3,INR:3,HEPARINUNFRC:3,CREATININE:3,CKTOTAL:3,CKMB:3,TROPONINI:3 in the last 72 hours CrCl is unknown because there is no height on file for the current visit.  Medical History: Past Medical History  Diagnosis Date  . HTN (hypertension)   . Dyslipidemia   . Diverticulosis   . Arthritis   . Diverticulitis   . PONV (postoperative nausea and vomiting)     N&V when awakening from anesthesia  . Dysrhythmia     irregular heart beat  . Shortness of breath     saw cardiologist for this  . UTI (urinary tract infection)     history of UTI  . Jaundice, obstructive, intrahepatic     s/p ERCP, lithotripsy, and stone removal 01/2011, 02/2011     Assessment: 85 YOF s/p L TKA with hardware removal, to start coumadin for VTE prophylaxis. Baseline INR 1.0 on 12/10, LFTs wnl. Patient is also on lovenox 30 sq q12  Goal of Therapy:  INR 2-3   Plan:  Coumadin 5mg  po x 1 F/u INR in am.  Riki Rusk 07/13/2011,5:37 PM

## 2011-07-13 NOTE — Anesthesia Preprocedure Evaluation (Addendum)
Anesthesia Evaluation  Patient identified by MRN, date of birth, ID band Patient awake    Reviewed: Allergy & Precautions, H&P , NPO status , Patient's Chart, lab work & pertinent test results  Airway Mallampati: I TM Distance: >3 FB Neck ROM: Full    Dental  (+) Teeth Intact, Poor Dentition, Dental Advisory Given and Edentulous Upper,    Pulmonary neg pulmonary ROS,  clear to auscultation  Pulmonary exam normal       Cardiovascular hypertension, Regular Normal Dr. Rochele Pages pt off all BP meds. B/c "I didn't need them"   Neuro/Psych Negative Neurological ROS  Negative Psych ROS   GI/Hepatic negative GI ROS, Neg liver ROS,   Endo/Other  Negative Endocrine ROS  Renal/GU negative Renal ROS  Genitourinary negative   Musculoskeletal  (+) Arthritis -, Osteoarthritis,    Abdominal   Peds  Hematology negative hematology ROS (+)   Anesthesia Other Findings   Reproductive/Obstetrics negative OB ROS                        Anesthesia Physical Anesthesia Plan  ASA: II  Anesthesia Plan: General   Post-op Pain Management:    Induction: Intravenous  Airway Management Planned: Oral ETT  Additional Equipment:   Intra-op Plan:   Post-operative Plan: Extubation in OR  Informed Consent: I have reviewed the patients History and Physical, chart, labs and discussed the procedure including the risks, benefits and alternatives for the proposed anesthesia with the patient or authorized representative who has indicated his/her understanding and acceptance.   Dental advisory given  Plan Discussed with: CRNA and Surgeon  Anesthesia Plan Comments:         Anesthesia Quick Evaluation

## 2011-07-14 LAB — CBC
HCT: 30.9 % — ABNORMAL LOW (ref 36.0–46.0)
Platelets: 261 10*3/uL (ref 150–400)
RDW: 16 % — ABNORMAL HIGH (ref 11.5–15.5)
WBC: 7.9 10*3/uL (ref 4.0–10.5)

## 2011-07-14 LAB — PROTIME-INR
INR: 1.2 (ref 0.00–1.49)
Prothrombin Time: 15.5 seconds — ABNORMAL HIGH (ref 11.6–15.2)

## 2011-07-14 LAB — BASIC METABOLIC PANEL
BUN: 10 mg/dL (ref 6–23)
Chloride: 108 mEq/L (ref 96–112)
GFR calc Af Amer: 88 mL/min — ABNORMAL LOW (ref >90)
Potassium: 4.2 mEq/L (ref 3.5–5.1)

## 2011-07-14 MED ORDER — WARFARIN SODIUM 5 MG PO TABS
5.0000 mg | ORAL_TABLET | Freq: Once | ORAL | Status: AC
  Administered 2011-07-14: 5 mg via ORAL
  Filled 2011-07-14: qty 1

## 2011-07-14 NOTE — Progress Notes (Signed)
Occupational Therapy Evaluation Patient Details Name: Kimberly Rasmussen MRN: [id number] DOB: Jul 22, 1926 Today's Date: 07/14/2011  Problem List:  Patient Active Problem List  Diagnoses  . Hypertension  . Hyperlipidemia  . Degenerative arthritis of left knee    Past Medical History:  Past Medical History  Diagnosis Date  . HTN (hypertension)   . Dyslipidemia   . Diverticulosis   . Arthritis   . Diverticulitis   . PONV (postoperative nausea and vomiting)     N&V when awakening from anesthesia  . Dysrhythmia     irregular heart beat  . Shortness of breath     saw cardiologist for this  . UTI (urinary tract infection)     history of UTI  . Jaundice, obstructive, intrahepatic     s/p ERCP, lithotripsy, and stone removal 01/2011, 02/2011   Past Surgical History:  Past Surgical History  Procedure Date  . Total knee arthroplasty     RIGHT KNEE  . Knee surgery     LEFT KNEE  . Total abdominal hysterectomy   . Cholecystectomy     removal of gallstones x4 (?ERCP)  . Foot surgery     x2  . Ercp     with lithotripsy and gall stone removal 2012    OT Assessment/Plan/Recommendation OT Assessment Clinical Impression Statement: Pt s/p L TKA.  Pt plans to eventually d/c home with daughter. May need to consider SNF if pt does not progress.Will follow acutely to increase I with ADLs and functional transfers in prep for d/c. OT Recommendation/Assessment: Patient will need skilled OT in the acute care venue OT Problem List: Decreased activity tolerance;Decreased knowledge of use of DME or AE;Pain OT Therapy Diagnosis : Acute pain OT Plan OT Frequency: Min 2X/week OT Treatment/Interventions: Self-care/ADL training;DME and/or AE instruction;Therapeutic activities;Patient/family education OT Recommendation Follow Up Recommendations: 24 hour supervision/assistance Equipment Recommended:  (To be determined if pt needs tub bench) Individuals Consulted Consulted and Agree with Results  and Recommendations: Patient OT Goals Acute Rehab OT Goals OT Goal Formulation: With patient Time For Goal Achievement: 2 weeks ADL Goals Pt Will Perform Grooming: with modified independence;Standing at sink ADL Goal: Grooming - Progress: Not met Pt Will Perform Lower Body Bathing: with modified independence;Sit to stand from chair;Sit to stand from bed ADL Goal: Lower Body Bathing - Progress: Not met Pt Will Perform Lower Body Dressing: with modified independence;Sit to stand from chair;Sit to stand from bed ADL Goal: Lower Body Dressing - Progress: Not met Pt Will Transfer to Toilet: with modified independence;Stand pivot transfer;Ambulation;with DME;3-in-1 ADL Goal: Toilet Transfer - Progress: Not met Pt Will Perform Tub/Shower Transfer: Tub transfer;with modified independence;Ambulation;with DME;Shower seat with back;Transfer tub bench (to be determined whether pt needs bench or seat) ADL Goal: Tub/Shower Transfer - Progress: Not met Miscellaneous OT Goals Miscellaneous OT Goal #1: Pt will perform bed mobility with mod I in prep for EOB ADLs. OT Goal: Miscellaneous Goal #1 - Progress: Not met  OT Evaluation Precautions/Restrictions  Precautions Precautions: Knee Restrictions Weight Bearing Restrictions: Yes LLE Weight Bearing: Weight bearing as tolerated Prior Functioning Home Living Lives With: Daughter (will go home to daughter's home) Receives Help From: Family Type of Home: House Home Layout: Two level;Able to live on main level with bedroom/bathroom Alternate Level Stairs-Rails:  (NA) Home Access: Stairs to enter Entrance Stairs-Rails: Left Entrance Stairs-Number of Steps: 4 Bathroom Shower/Tub: Engineer, manufacturing systems: Standard Home Adaptive Equipment: Bedside commode/3-in-1 (will need to corroborate home equipment info) Prior Function Level of Independence: Independent  with basic ADLs Comments: has been living with daughter since surgery this past summer;  Daughter can provide assist, but she is out of the house intermittently ADL ADL Eating/Feeding: Simulated;Independent Where Assessed - Eating/Feeding: Edge of bed Grooming: Simulated;Set up Where Assessed - Grooming: Sitting, bed;Unsupported Upper Body Bathing: Simulated;Set up Where Assessed - Upper Body Bathing: Sitting, bed;Unsupported Lower Body Bathing: Simulated;Minimal assistance Where Assessed - Lower Body Bathing: Sit to stand from bed Upper Body Dressing: Simulated;Set up Where Assessed - Upper Body Dressing: Sitting, bed Lower Body Dressing: Simulated;Moderate assistance Lower Body Dressing Details (indicate cue type and reason): Pt unable to don/doff L sock with I and requires assist to  Where Assessed - Lower Body Dressing: Sit to stand from bed Toilet Transfer: Simulated;Minimal assistance Toilet Transfer Method: Stand pivot Equipment Used: Rolling walker Vision/Perception    Cognition Cognition Arousal/Alertness: Awake/alert Overall Cognitive Status: Appears within functional limits for tasks assessed (Pt very HOH ) Orientation Level: Oriented X4 Sensation/Coordination Sensation Light Touch: Appears Intact Coordination Gross Motor Movements are Fluid and Coordinated: Yes Fine Motor Movements are Fluid and Coordinated: Not tested Extremity Assessment RUE Assessment RUE Assessment: Within Functional Limits LUE Assessment LUE Assessment: Within Functional Limits Mobility  Bed Mobility Bed Mobility: Yes Supine to Sit: 4: Min assist;With rails Supine to Sit Details (indicate cue type and reason): min assist to L LE Sitting - Scoot to Edge of Bed: 4: Min assist Sitting - Scoot to [place] of Bed Details (indicate cue type and reason): min assist to LLE  Sit to Supine - Left: 4: Min assist;With rail Sit to Supine - Left Details (indicate cue type and reason): min assist to LLE Transfers Transfers: Yes Sit to Stand: 4: Min assist;With upper extremity assist;From  bed Sit to Stand Details (indicate cue type and reason): VC for hand placement Stand to Sit: 4: Min assist;To bed;With upper extremity assist Stand to Sit Details: VC for hand placement and controlled descent Exercises  End of Session OT - End of Session Activity Tolerance: Patient tolerated treatment well Patient left: in bed;with call bell in reach General Behavior During Session: [facility] for tasks performed Cognition: [facility] for tasks performed   Cipriano Mile 07/14/2011, 2:21 PM  07/14/2011 Cipriano Mile OTR/L Pager [phone] Office [phone]

## 2011-07-14 NOTE — Progress Notes (Signed)
Patient ID: Adrie Picking, female   DOB: 03-13-1926, 77 y.o.   MRN: [id number] PATIENT ID: Olivine Hiers  MRN: [id number]  DOB/AGE:  11-16-1925 / 77 y.o.  1 Day Post-Op Procedure(s) (LRB): TOTAL KNEE ARTHROPLASTY (Left)    PROGRESS NOTE Subjective: Patient is alert, oriented, no Nausea, no Vomiting, no passing gas, no Bowel Movement. Taking PO well. Denies SOB, Chest or Calf Pain. Using Incentive Spirometer, PAS in place. Ambulate today WBAT, CPM 0-30 x 6 hr Patient reports pain as 4 on 0-10 scale  .    Objective: Vital signs in last 24 hours: Filed Vitals:   07/13/11 1400 07/13/11 1545 07/13/11 1630 07/13/11 2321  BP:   167/88 115/56  Pulse:   67 87  Temp: 97.9 F (36.6 C) 98 F (36.7 C)  98.3 F (36.8 C)  TempSrc:    Oral  Resp:   16 18  SpO2:    100%      Intake/Output from previous day: I/O last 3 completed shifts: In: 1750 [I.V.:1750] Out: 425 [Urine:75; Drains:300; Blood:50]   Intake/Output this shift: Total I/O In: -  Out: 700 [Urine:300; Drains:400]   LABORATORY DATA:  Basename 07/13/11 0950  WBC --  HGB --  HCT --  PLT --  NA --  K --  CL --  CO2 --  BUN --  CREATININE --  GLUCOSE --  GLUCAP 81  INR --  CALCIUM --    Examination: Neurologically intact ABD soft Neurovascular intact Sensation intact distally Intact pulses distally Dorsiflexion/Plantar flexion intact Incision: dressing C/D/I No cellulitis present Compartment soft} Drain pulled plasma seperated in tube Assessment:   1 Day Post-Op Procedure(s) (LRB): TOTAL KNEE ARTHROPLASTY (Left) ADDITIONAL DIAGNOSIS:  Hypertension  Plan: PT/OT WBAT, CPM 5/hrs day until ROM 0-90 degrees, then D/C CPM DVT Prophylaxis:  Lovenox\Coumadin bridge target INR 1.5-2.0 DISCHARGE PLAN: Skilled Nursing Facility/Rehab DISCHARGE NEEDS: HHPT, HHRN, CPM, Walker and 3-in-1 comode seat     Morry Veiga J 07/14/2011, 6:15 AM

## 2011-07-14 NOTE — Progress Notes (Signed)
Physical Therapy Evaluation Patient Details Name: Hadlee Burback MRN: [id number] DOB: 02/08/1926 Today's Date: 07/14/2011  Problem List:  Patient Active Problem List  Diagnoses  . Hypertension  . Hyperlipidemia  . Degenerative arthritis of left knee    Past Medical History:  Past Medical History  Diagnosis Date  . HTN (hypertension)   . Dyslipidemia   . Diverticulosis   . Arthritis   . Diverticulitis   . PONV (postoperative nausea and vomiting)     N&V when awakening from anesthesia  . Dysrhythmia     irregular heart beat  . Shortness of breath     saw cardiologist for this  . UTI (urinary tract infection)     history of UTI  . Jaundice, obstructive, intrahepatic     s/p ERCP, lithotripsy, and stone removal 01/2011, 02/2011   Past Surgical History:  Past Surgical History  Procedure Date  . Total knee arthroplasty     RIGHT KNEE  . Knee surgery     LEFT KNEE  . Total abdominal hysterectomy   . Cholecystectomy     removal of gallstones x4 (?ERCP)  . Foot surgery     x2  . Ercp     with lithotripsy and gall stone removal 2012    PT Assessment/Plan/Recommendation PT Assessment Clinical Impression Statement: 77 yo female s/p LTKA presents with decr functional mobility and the impairments listed below; will benefit from PT services to maximize independence and safety with mobility and enable safe dc home modified independently when appropriate; Ms. Castelo performed very well on eval, and if she progresses well over next few sessions, may be able to dc to daughter's homewith HHPT f/u; Pt and daughter are considering this, and PT will continue to monitor progress and help with dc planning -- SNF is also a good option PT Recommendation/Assessment: Patient will need skilled PT in the acute care venue PT Problem List: Decreased strength;Decreased range of motion;Decreased mobility;Decreased knowledge of use of DME;Pain Barriers to Discharge: Decreased caregiver  support Barriers to Discharge Comments: Daughter can provide near 24 hour assist, but is out intermittently; pt must be modified independent to dc home PT Therapy Diagnosis : Difficulty walking;Abnormality of gait;Acute pain PT Plan PT Frequency: 7X/week PT Treatment/Interventions: DME instruction;Gait training;Stair training;Functional mobility training;Therapeutic activities;Therapeutic exercise;Patient/family education PT Recommendation Follow Up Recommendations: Home health PT;Skilled nursing facility (depends on progress) Equipment Recommended: Rolling walker with 5" wheels (possibly tub seat, or shower chair, will defer to OT recs) PT Goals  Acute Rehab PT Goals PT Goal Formulation: With patient/family Time For Goal Achievement: 7 days Pt will go Supine/Side to Sit: with modified independence Pt will go Sit to Supine/Side: with modified independence Pt will go Sit to Stand: with modified independence Pt will go Stand to Sit: with modified independence Pt will Ambulate: >150 feet;with modified independence;with rolling walker Pt will Go Up / Down Stairs: 3-5 stairs;with rail(s);with min assist Pt will Perform Home Exercise Program: Independently  PT Evaluation Precautions/Restrictions  Precautions Precautions: Knee Restrictions Weight Bearing Restrictions: Yes LLE Weight Bearing: Weight bearing as tolerated Prior Functioning  Home Living Lives With: Daughter (will go home to daughter's home) Receives Help From: Family Type of Home: House Home Layout: Two level;Able to live on main level with bedroom/bathroom Alternate Level Stairs-Rails:  (NA) Home Access: Stairs to enter Entrance Stairs-Rails: Left Entrance Stairs-Number of Steps: 4 Home Adaptive Equipment: Bedside commode/3-in-1 (will need to corroborate home equipment info) Prior Function Level of Independence: Independent with basic ADLs Comments: has  been living with daughter since surgery this past summer; Daughter  can provide assist, but she is out of the house intermittently Cognition Cognition Arousal/Alertness: Awake/alert Overall Cognitive Status: Appears within functional limits for tasks assessed (very HOH without hearing aids (daughter will bring them in)) Orientation Level: Oriented X4 Sensation/Coordination Sensation Light Touch: Appears Intact Coordination Gross Motor Movements are Fluid and Coordinated: Yes Fine Motor Movements are Fluid and Coordinated: Not tested Extremity Assessment RUE Assessment RUE Assessment: Within Functional Limits LUE Assessment LUE Assessment: Within Functional Limits RLE Assessment RLE Assessment: Within Functional Limits LLE Assessment LLE Assessment: Exceptions to [facility] LLE Strength LLE Overall Strength Comments: grossly decr AROM and strength, limited by pain postop; good quad activation; AAROM approx0to 65 degrees Mobility (including Balance) Bed Mobility Bed Mobility: Yes Supine to Sit: 3: Mod assist;With rails Supine to Sit Details (indicate cue type and reason): cues for technique, and safe transfer Sitting - Scoot to Edge of Bed: 3: Mod assist Sitting - Scoot to Edge of Bed Details (indicate cue type and reason): cues for safe technique Transfers Transfers: Yes Sit to Stand: 3: Mod assist;With upper extremity assist;From bed Sit to Stand Details (indicate cue type and reason): cues for safe transfer, hand placement, upright posture Stand to Sit: 4: Min assist;To chair/3-in-1;With armrests;With upper extremity assist Stand to Sit Details: cues for safe hand placement and contro of descent Ambulation/Gait Ambulation/Gait: Yes Ambulation/Gait Assistance: 4: Min assist (second person pushing chair for safety) Ambulation/Gait Assistance Details (indicate cue type and reason): cues for sequence and upright posture; good distance; cues to activate Left quad for stance stability Ambulation Distance (Feet): 70 Feet Assistive device: Rolling  walker Gait Pattern: Step-to pattern    Exercise  Total Joint Exercises Quad Sets: AROM;Left;10 reps;Supine Heel Slides: AAROM;Left;10 reps;Supine End of Session PT - End of Session Equipment Utilized During Treatment: Gait belt Activity Tolerance: Patient tolerated treatment well Patient left: in chair;with call bell in reach;with family/visitor present Nurse Communication: Mobility status for transfers;Mobility status for ambulation General Behavior During Session: [facility] for tasks performed Cognition: [facility] for tasks performed  Van Clines [facility] [place], [place] [phone]  07/14/2011, 12:24 PM

## 2011-07-14 NOTE — Plan of Care (Signed)
Problem: Consults Goal: Diagnosis- Total Joint Replacement Outcome: Progressing Primary Total Knee  Problem: Phase II Progression Outcomes Goal: Discharge plan established Outcome: Progressing Plans to d/c to snf

## 2011-07-14 NOTE — Progress Notes (Signed)
ANTICOAGULATION CONSULT NOTE - Follow Up Consult  Pharmacy Consult for Coumadin with Lovenox bridging Indication: VTE prophylaxis  Allergies  Allergen Reactions  . Amitriptyline   . Gabapentin   . Meloxicam   . Tramadol     Patient Measurements: Height: 5\' 5"  (165.1 cm) Weight: 152 lb (68.947 kg) IBW/kg (Calculated) : 57    Vital Signs: Temp: 98.2 F (36.8 C) (12/18 0713) Temp src: Oral (12/18 0713) BP: 125/74 mmHg (12/18 0713) Pulse Rate: 77  (12/18 0713)  Labs:  Basename 07/14/11 0545  HGB 10.0*  HCT 30.9*  PLT 261  APTT --  LABPROT 15.5*  INR 1.20  HEPARINUNFRC --  CREATININE 0.77  CKTOTAL --  CKMB --  TROPONINI --   Estimated Creatinine Clearance: 52 ml/min (by C-G formula based on Cr of 0.77).   Medications:  Prescriptions prior to admission  Medication Sig Dispense Refill  . celecoxib (CELEBREX) 200 MG capsule Take 1 capsule (200 mg total) by mouth daily.  30 capsule  2  . oxyCODONE (OXY IR/ROXICODONE) 5 MG immediate release tablet Take 5 mg by mouth every 6 (six) hours as needed.        . metaxalone (SKELAXIN) 800 MG tablet Take 1 tablet (800 mg total) by mouth 3 (three) times daily.  30 tablet  0  . metoprolol succinate (TOPROL-XL) 25 MG 24 hr tablet Take 25 mg by mouth daily.       . niacin 500 MG tablet Take 500 mg by mouth at bedtime.        . simvastatin (ZOCOR) 20 MG tablet Take 20 mg by mouth at bedtime.         Scheduled:    . celecoxib  200 mg Oral Daily  . dextrose 5 % and 0.45 % NaCl with KCl 20 mEq/L      . enoxaparin  30 mg Subcutaneous Q12H  . niacin  500 mg Oral QHS  . patient's guide to using coumadin book   Does not apply Once  . warfarin  5 mg Oral ONCE-1800  . warfarin   Does not apply Once  . DISCONTD: chlorhexidine  60 mL Topical Once  . DISCONTD: metaxalone  800 mg Oral TID  . DISCONTD: metoprolol succinate  25 mg Oral Daily  . DISCONTD: simvastatin  20 mg Oral QHS    Assessment: 1.  77 y/o female Post-op Day #1 L-TKA.   She is on Coumadin + Lovenox bridging for VTE prophylaxis.  Goal of Therapy:  INR 1.5 - 2   Plan:  1.  Continue Lovenox 30mg  sq q 12 hours. 2.  Coumadin 5mg  po today. 3.  SNF/Rehab as per discharge plan.  Stachia Slutsky, Elisha Headland, Pharm.D. 07/14/2011 10:56 AM

## 2011-07-14 NOTE — Progress Notes (Signed)
CARE MANAGEMENT NOTE 07/14/2011 Discharge planning. Pt preoperatively setup with [facility]. Waiting to see how she progresses with PT to determine home versus SNF for shortterm rehab.

## 2011-07-15 LAB — PROTIME-INR
INR: 1.32 (ref 0.00–1.49)
Prothrombin Time: 16.6 seconds — ABNORMAL HIGH (ref 11.6–15.2)

## 2011-07-15 LAB — CBC
Platelets: 251 10*3/uL (ref 150–400)
RBC: 3.41 MIL/uL — ABNORMAL LOW (ref 3.87–5.11)
WBC: 11 10*3/uL — ABNORMAL HIGH (ref 4.0–10.5)

## 2011-07-15 MED ORDER — WARFARIN SODIUM 2.5 MG PO TABS: 2.5000 mg | ORAL_TABLET | Freq: Every day | ORAL | Status: DC

## 2011-07-15 MED ORDER — HYDROCODONE-ACETAMINOPHEN 5-325 MG PO TABS: 1.0000 | ORAL_TABLET | ORAL | Status: AC | PRN

## 2011-07-15 MED ORDER — WARFARIN SODIUM 2.5 MG PO TABS
2.5000 mg | ORAL_TABLET | Freq: Once | ORAL | Status: AC
  Administered 2011-07-15: 2.5 mg via ORAL
  Filled 2011-07-15 (×3): qty 1

## 2011-07-15 NOTE — Progress Notes (Signed)
ANTICOAGULATION CONSULT NOTE - Follow Up Consult  Pharmacy Consult for: Coumadin Indication: VTE px s/p L TKA  Allergies  Allergen Reactions  . Amitriptyline   . Gabapentin   . Meloxicam   . Tramadol    Vital Signs: Temp: 98.7 F (37.1 C) (12/19 0602) Temp src: Oral (12/19 0602) BP: 137/73 mmHg (12/19 0602) Pulse Rate: 90  (12/19 0602)  Labs:  Basename 07/15/11 0626 07/14/11 0545  HGB 9.8* 10.0*  HCT 30.4* 30.9*  PLT 251 261  APTT -- --  LABPROT 16.6* 15.5*  INR 1.32 1.20  HEPARINUNFRC -- --  CREATININE -- 0.77  CKTOTAL -- --  CKMB -- --  TROPONINI -- --   Estimated Creatinine Clearance: 52 ml/min (by C-G formula based on Cr of 0.77).  Assessment: 85yof continues on coumadin for VTE px s/p L TKA. INR of 1.32 is subtherapeutic but approaching goal.  Will decrease dose today given narrow goal range. No bleeding noted.  Goal of Therapy:  INR 1.5-2   Plan:  1) Coumadin 2.5mg  x 1 2) Follow up INR in AM 3) Continue lovenox until INR > 1.5  Fredrik Rigger 07/15/2011,10:42 AM

## 2011-07-15 NOTE — Discharge Summary (Signed)
Patient ID: Kimberly Rasmussen MRN: [id number] DOB/AGE: [birth year] 75 y.o.  Admit date: 07/13/2011 Discharge date: 07/15/2011  Admission Diagnoses:  Principal Problem:  *Degenerative arthritis of left knee   Discharge Diagnoses:  Same  Past Medical History  Diagnosis Date  . HTN (hypertension)   . Dyslipidemia   . Diverticulosis   . Arthritis   . Diverticulitis   . PONV (postoperative nausea and vomiting)     N&V when awakening from anesthesia  . Dysrhythmia     irregular heart beat  . Shortness of breath     saw cardiologist for this  . UTI (urinary tract infection)     history of UTI  . Jaundice, obstructive, intrahepatic     s/p ERCP, lithotripsy, and stone removal 01/2011, 02/2011    Surgeries: Procedure(s): TOTAL KNEE ARTHROPLASTY on 07/13/2011   Consultants:    Discharged Condition: Improved  Hospital Course: Kimberly Rasmussen is an 77 y.o. female who was admitted 07/13/2011 for operative treatment ofDegenerative arthritis of left knee. Patient has severe unremitting pain that affects sleep, daily activities, and work/hobbies. After pre-op clearance the patient was taken to the operating room on 07/13/2011 and underwent  Procedure(s): TOTAL KNEE ARTHROPLASTY.    Patient was given perioperative antibiotics: Anti-infectives     Start     Dose/Rate Route Frequency Ordered Stop   07/13/11 1150   cefUROXime (ZINACEF) injection  Status:  Discontinued          As needed 07/13/11 1150 07/13/11 1237   07/12/11 0900   ceFAZolin (ANCEF) IVPB 1 g/50 mL premix        1 g 100 mL/hr over 30 Minutes Intravenous 60 min pre-op 07/12/11 0858 07/13/11 1040           Patient was given sequential compression devices, early ambulation, and chemoprophylaxis to prevent DVT.  Patient benefited maximally from hospital stay and there were no complications.    Recent vital signs: Patient Vitals for the past 24 hrs:  BP Temp Temp src Pulse Resp SpO2  07/15/11 0602 137/73 mmHg  98.7 F (37.1 C) Oral 90  17  94 %  2011/07/27 2100 146/70 mmHg 98.3 F (36.8 C) Oral 86  16  95 %     Recent laboratory studies:  Basename 07/15/11 0626 July 27, 2011 0545  WBC 11.0* 7.9  HGB 9.8* 10.0*  HCT 30.4* 30.9*  PLT 251 261  NA -- 138  K -- 4.2  CL -- 108  CO2 -- 25  BUN -- 10  CREATININE -- 0.77  GLUCOSE -- 104*  INR 1.32 1.20  CALCIUM -- 8.8     Discharge Medications:  Current Discharge Medication List    START taking these medications   Details  HYDROcodone-acetaminophen (NORCO) 5-325 MG per tablet Take 1-2 tablets by mouth every 4 (four) hours as needed. Qty: 60 tablet, Refills: 0    warfarin (COUMADIN) 2.5 MG tablet Take 1 tablet (2.5 mg total) by mouth daily at 8 pm. Qty: 30 tablet, Refills: 0      CONTINUE these medications which have NOT CHANGED   Details  celecoxib (CELEBREX) 200 MG capsule Take 1 capsule (200 mg total) by mouth daily. Qty: 30 capsule, Refills: 2    metaxalone (SKELAXIN) 800 MG tablet Take 1 tablet (800 mg total) by mouth 3 (three) times daily. Qty: 30 tablet, Refills: 0    niacin 500 MG tablet Take 500 mg by mouth at bedtime.      simvastatin (ZOCOR) 20 MG tablet Take 20  mg by mouth at bedtime.        STOP taking these medications     oxyCODONE (OXY IR/ROXICODONE) 5 MG immediate release tablet      metoprolol succinate (TOPROL-XL) 25 MG 24 hr tablet         Diagnostic Studies: Dg Chest 2 View  07/06/2011  *RADIOLOGY REPORT*  Clinical Data: Osteoarthritis of the left knee.  CHEST - 2 VIEW  Comparison: Chest radiograph 01/07/2011 and 01/05/2011  Findings: Heart size is borderline enlarged.  There is a stable tortuous thoracic aorta contour.  There is attenuation of the lung markings at the lung apices, and slight flattening of the hemidiaphragms.  These findings raise a question of mild emphysematous changes.  No airspace disease, effusion, or pneumothorax.  The bones are osteopenic.  There are degenerative changes of the thoracic  spine.  No acute bony abnormalities identified.  IMPRESSION:  1. Cannot exclude mild emphysema. 2.  No acute findings.  Original Report Authenticated By: Britta Mccreedy, M.D.   Dg Knee Left Port  07/13/2011  *RADIOLOGY REPORT*  Clinical Data:  left knee replacement  PORTABLE LEFT KNEE - 1-2 VIEW  Comparison: None.  Findings: Two portable views of the left knee submitted.  There is left knee prosthesis anatomic alignment.  Postsurgical changes are noted.  Midline skin staples. Two surgical drains.  Periarticular soft tissue air.  A metallic screw noted in the left patella.  IMPRESSION: Left knee prosthesis in anatomic alignment.  Postsurgical changes are noted.  Original Report Authenticated By: Natasha Mead, M.D.    Disposition: SNF [facility]  Discharge Orders    Future Orders Please Complete By Expires   Diet - low sodium heart healthy      Call MD / Call 911      Comments:   If you experience chest pain or shortness of breath, CALL 911 and be transported to the hospital emergency room.  If you develope a fever above 101 F, pus (white drainage) or increased drainage or redness at the wound, or calf pain, call your surgeon's office.   Constipation Prevention      Comments:   Drink plenty of fluids.  Prune juice may be helpful.  You may use a stool softener, such as Colace (over the counter) 100 mg twice a day.  Use MiraLax (over the counter) for constipation as needed.   Increase activity slowly as tolerated      Weight Bearing as taught in Physical Therapy      Comments:   Use a walker or crutches as instructed.   Patient may shower      Comments:   You may shower without a dressing once there is no drainage.  Do not wash over the wound.  If drainage remains, cover wound with plastic wrap and then shower.   Driving restrictions      Comments:   No driving for 2 weeks   CPM      Comments:   Continuous passive motion machine (CPM):      Use the CPM from 0 to 60 for 5 hours per day.       You may increase by 10 degrees per day.  You may break it up into 2 or 3 sessions per day.      Use CPM for 2 weeks or until you are told to stop.   Change dressing      Comments:   Change dressing on POD #5.  You may clean the  incision with alcohol prior to redressing.   Do not put a pillow under the knee. Place it under the heel.         Follow-up Information    Follow up with [facility]. Make an appointment in 13 days.   Contact information:   [facility] [address] [place] [address] [phone]           Signed: Nestor Lewandowsky 07/15/2011, 1:21 PM

## 2011-07-15 NOTE — Progress Notes (Signed)
Patient ID: Cheron Coryell, female   DOB: 12/01/1925, 77 y.o.   MRN: [id number] PATIENT ID: Lavanya Roa  MRN: [id number]  DOB/AGE:  [birth year] / 77 y.o.  2 Days Post-Op Procedure(s) (LRB): TOTAL KNEE ARTHROPLASTY (Left)    PROGRESS NOTE Subjective: Patient is alert, oriented, no Nausea, no Vomiting, yes passing gas, no Bowel Movement. Taking PO well. Denies SOB, Chest or Calf Pain. Using Incentive Spirometer, PAS in place. Ambulate in hallway, CPM 0-40 Patient reports pain as 2 on 0-10 scale c/o L SDR pain mild .    Objective: Vital signs in last 24 hours: Filed Vitals:   07/14/11 1044 07/14/11 1300 07/14/11 2100 07/15/11 0602  BP:  128/57 146/70 137/73  Pulse:  69 86 90  Temp:  98.2 F (36.8 C) 98.3 F (36.8 C) 98.7 F (37.1 C)  TempSrc:   Oral Oral  Resp:  18 16 17   Height: 5\' 5"  (1.651 m)     Weight: 68.947 kg (152 lb)     SpO2:  92% 95% 94%      Intake/Output from previous day: I/O last 3 completed shifts: In: 1990 [P.O.:240; I.V.:1750] Out: 1625 [Urine:875; Drains:700; Blood:50]   Intake/Output this shift:     LABORATORY DATA:  Basename 07/14/11 0545 07/13/11 0950  WBC 7.9 --  HGB 10.0* --  HCT 30.9* --  PLT 261 --  NA 138 --  K 4.2 --  CL 108 --  CO2 25 --  BUN 10 --  CREATININE 0.77 --  GLUCOSE 104* --  GLUCAP -- 81  INR 1.20 --  CALCIUM 8.8 --    Examination: Neurologically intact ABD soft Neurovascular intact Sensation intact distally Intact pulses distally Dorsiflexion/Plantar flexion intact Incision: dressing C/D/I No cellulitis present Compartment soft} L SDR FROM Assessment:   2 Days Post-Op Procedure(s) (LRB): TOTAL KNEE ARTHROPLASTY (Left) ADDITIONAL DIAGNOSIS:    Plan: PT/OT WBAT, CPM 5/hrs day until ROM 0-90 degrees, then D/C CPM DVT Prophylaxis:  Lovenox\Coumadin bridge target INR 1.5-2.0 DISCHARGE PLAN: Skilled Nursing Facility/Rehab DISCHARGE NEEDS: HHPT, HHRN, CPM, Walker and 3-in-1 comode seat     Madox Corkins  J 07/15/2011, 6:39 AM

## 2011-07-15 NOTE — Progress Notes (Signed)
Physical Therapy Treatment Patient Details Name: Kimberly Rasmussen MRN: [id number] DOB: [birth year] Today's Date: 07/15/2011  PT Assessment/Plan  PT - Assessment/Plan Comments on Treatment Session: making steady improvements; pt and daughter seem happy with decision for SNF for rehab PT Plan: Discharge plan needs to be updated PT Frequency: 7X/week Follow Up Recommendations: Skilled nursing facility Equipment Recommended: Defer to next venue PT Goals  Acute Rehab PT Goals Time For Goal Achievement: 7 days Pt will go Supine/Side to Sit: with modified independence PT Goal: Supine/Side to Sit - Progress: Progressing toward goal Pt will go Sit to Supine/Side: with modified independence PT Goal: Sit to Supine/Side - Progress: Progressing toward goal Pt will go Sit to Stand: with modified independence PT Goal: Sit to Stand - Progress: Progressing toward goal Pt will go Stand to Sit: with modified independence PT Goal: Stand to Sit - Progress: Progressing toward goal Pt will Ambulate: >150 feet;with modified independence;with rolling walker PT Goal: Ambulate - Progress: Progressing toward goal Pt will Go Up / Down Stairs: 3-5 stairs;with rail(s);with min assist PT Goal: Up/Down Stairs - Progress: Other (comment) Pt will Perform Home Exercise Program: Independently PT Goal: Perform Home Exercise Program - Progress: Progressing toward goal  PT Treatment Precautions/Restrictions  Precautions Precautions: Knee Restrictions Weight Bearing Restrictions: Yes LLE Weight Bearing: Weight bearing as tolerated Mobility (including Balance) Bed Mobility Supine to Sit: 4: Min assist;HOB flat Supine to Sit Details (indicate cue type and reason): cues for technique Sitting - Scoot to Edge of Bed: 4: Min assist (without physical contact) Sitting - Scoot to [place] of Bed Details (indicate cue type and reason): cues for safe techniuaqe Transfers Sit to Stand: 4: Min assist;With upper extremity  assist;From bed Sit to Stand Details (indicate cue type and reason): CUES FOR SAFE HAND PLACEMENT Stand to Sit: 4: Min assist;With upper extremity assist;To chair/3-in-1 Stand to Sit Details: CUES FOR TECHNIQUE DAFETY Ambulation/Gait Ambulation/Gait Assistance: 4: Min assist (without physical contact) Ambulation/Gait Assistance Details (indicate cue type and reason): cues for sequence and to activaqte L quad for stance stability Ambulation Distance (Feet): 130 Feet Assistive device: Rolling walker Gait Pattern: Step-to pattern    Exercise  Total Joint Exercises Quad Sets: AROM;Left;10 reps;Supine Short Arc Quad: AAROM;10 reps;Left Heel Slides: AAROM;Left;10 reps;Supine Straight Leg Raises: AAROM;Left;10 reps End of Session PT - End of Session Equipment Utilized During Treatment: Gait belt Activity Tolerance: Patient tolerated treatment well Patient left: in chair;with call bell in reach;with family/visitor present Nurse Communication: Mobility status for transfers;Mobility status for ambulation General Behavior During Session: [facility] for tasks performed Cognition: [facility] for tasks performed  Van Clines [facility] [place], [place] [phone]  07/15/2011, 3:17 PM

## 2011-07-16 ENCOUNTER — Encounter (HOSPITAL_COMMUNITY): Payer: Self-pay | Admitting: Orthopedic Surgery

## 2011-07-16 LAB — CBC
Platelets: 262 10*3/uL (ref 150–400)
RBC: 2.93 MIL/uL — ABNORMAL LOW (ref 3.87–5.11)
WBC: 9.8 10*3/uL (ref 4.0–10.5)

## 2011-07-16 LAB — PROTIME-INR
INR: 1.39 (ref 0.00–1.49)
Prothrombin Time: 17.3 seconds — ABNORMAL HIGH (ref 11.6–15.2)

## 2011-07-16 MED ORDER — COUMADIN BOOK
Freq: Once | Status: DC
  Filled 2011-07-16: qty 1

## 2011-07-16 NOTE — Progress Notes (Signed)
Ok per MD for d/c today to [facility] for short term rehab. OK per patient, daughter, SNF and nursing. EMS to transport. No further SW needs identified.  Darylene Price, BSW, 07/16/2011 11:27 AM

## 2011-07-16 NOTE — Progress Notes (Signed)
Physical Therapy Treatment Patient Details Name: Kimberly Rasmussen MRN: [id number] DOB: May 23, 1926 Today's Date: 07/16/2011  PT Assessment/Plan  PT - Assessment/Plan Comments on Treatment Session: Pt progressing well. Plans to DC to [facility] today.  PT Plan: Discharge plan remains appropriate PT Frequency: 7X/week Follow Up Recommendations: Skilled nursing facility Equipment Recommended: Defer to next venue PT Goals  Acute Rehab PT Goals PT Goal: Sit to Stand - Progress: Progressing toward goal PT Goal: Stand to Sit - Progress: Progressing toward goal PT Goal: Ambulate - Progress: Progressing toward goal PT Goal: Up/Down Stairs - Progress: Not met PT Goal: Perform Home Exercise Program - Progress: Progressing toward goal  PT Treatment Precautions/Restrictions  Precautions Precautions: Knee Restrictions Weight Bearing Restrictions: Yes LLE Weight Bearing: Weight bearing as tolerated Mobility (including Balance) Bed Mobility Bed Mobility: No Transfers Sit to Stand: With upper extremity assist;With armrests;From chair/3-in-1;Other (comment) (MinGuard A) Sit to Stand Details (indicate cue type and reason): Cues for safest hand placement and technique. Cues to shift weight anteriorly with stand Stand to Sit: To chair/3-in-1;With armrests;With upper extremity assist;Other (comment) (MinGuard A) Stand to Sit Details: Cues to sit slowly Ambulation/Gait Ambulation/Gait Assistance: 4: Min assist Ambulation/Gait Assistance Details (indicate cue type and reason): A for slight LOB posterior upon sudden stop from walking. Cues to look forward Ambulation Distance (Feet): 160 Feet Assistive device: Rolling walker Gait Pattern: Step-to pattern;Trunk flexed    Exercise  Total Joint Exercises Quad Sets: AROM;Left;10 reps Short Arc QuadBarbaraann Boys;Left;10 reps Heel Slides: AAROM;Left;10 reps Hip ABduction/ADduction: AAROM;Left;10 reps Straight Leg Raises: AAROM;Left;10 reps End of  Session PT - End of Session Equipment Utilized During Treatment: Gait belt Activity Tolerance: Patient tolerated treatment well Patient left: in chair;with call bell in reach;with family/visitor present General Behavior During Session: [facility] for tasks performed Cognition: [facility] for tasks performed 07/16/2011 Fredrich Birks PTA [phone] pager [phone] office    Fredrich Birks 07/16/2011, 11:57 AM

## 2011-07-30 ENCOUNTER — Encounter (HOSPITAL_COMMUNITY): Payer: Self-pay | Admitting: *Deleted

## 2011-07-30 ENCOUNTER — Inpatient Hospital Stay (HOSPITAL_COMMUNITY)
Admission: EM | Admit: 2011-07-30 | Discharge: 2011-08-02 | DRG: 378 | Disposition: A | Discharge status: Home Health | Payer: Medicare Other | Attending: Internal Medicine | Admitting: Internal Medicine

## 2011-07-30 DIAGNOSIS — K922 Gastrointestinal hemorrhage, unspecified: Secondary | ICD-10-CM | POA: Diagnosis present

## 2011-07-30 DIAGNOSIS — E785 Hyperlipidemia, unspecified: Secondary | ICD-10-CM | POA: Diagnosis present

## 2011-07-30 DIAGNOSIS — K5731 Diverticulosis of large intestine without perforation or abscess with bleeding: Principal | ICD-10-CM | POA: Diagnosis present

## 2011-07-30 DIAGNOSIS — M79609 Pain in unspecified limb: Secondary | ICD-10-CM | POA: Diagnosis present

## 2011-07-30 DIAGNOSIS — D649 Anemia, unspecified: Secondary | ICD-10-CM | POA: Diagnosis present

## 2011-07-30 DIAGNOSIS — Z7901 Long term (current) use of anticoagulants: Secondary | ICD-10-CM

## 2011-07-30 DIAGNOSIS — K5790 Diverticulosis of intestine, part unspecified, without perforation or abscess without bleeding: Secondary | ICD-10-CM

## 2011-07-30 DIAGNOSIS — I10 Essential (primary) hypertension: Secondary | ICD-10-CM | POA: Diagnosis present

## 2011-07-30 DIAGNOSIS — R350 Frequency of micturition: Secondary | ICD-10-CM | POA: Diagnosis present

## 2011-07-30 DIAGNOSIS — D62 Acute posthemorrhagic anemia: Secondary | ICD-10-CM | POA: Diagnosis present

## 2011-07-30 DIAGNOSIS — Z96659 Presence of unspecified artificial knee joint: Secondary | ICD-10-CM

## 2011-07-30 LAB — CBC
MCH: 28.8 pg (ref 26.0–34.0)
Platelets: 653 10*3/uL — ABNORMAL HIGH (ref 150–400)
RBC: 2.99 MIL/uL — ABNORMAL LOW (ref 3.87–5.11)
WBC: 10.3 10*3/uL (ref 4.0–10.5)

## 2011-07-30 LAB — PROTIME-INR: Prothrombin Time: 32.2 seconds — ABNORMAL HIGH (ref 11.6–15.2)

## 2011-07-30 NOTE — ED Notes (Addendum)
Brought from facility with c/o blood in stool since 07/20/11. Minimal abd pain. No diarrhea today. Pt in facility for rehab s/p knee replacement. 07/13/11, pt on coumadin at rehab facility

## 2011-07-30 NOTE — ED Notes (Signed)
Pt reports noting streaks of blood in her stool x4 days - pt is currently on coumadin regimen s/p left knee surgery. Pt denies any dizziness of light headedness, conjunctiva are pink in color. Pt A&Ox4 and appropriate. Family x4 at bedside. Pt also c/o lower abd pain reports as dull and feels worse after BM. Pt denies n/v/d or fever. Pt also w/ hx of diverticulitis.

## 2011-07-30 NOTE — ED Provider Notes (Signed)
History     CSN: [id number]  Arrival date & time 07/30/11  2018   First MD Initiated Contact with Patient 07/30/11 2331      Chief Complaint  Patient presents with  . Rectal Bleeding    (Consider location/radiation/quality/duration/timing/severity/associated sxs/prior treatment) HPI Comments: Patient is status post knee surgery, currently living in a rehabilitation center taking Coumadin noticed 3 days ago.  She had blood in her stool.  She reported this to, staff.  They've done guaiacs, which had been positive today.  Sent her to the emergency room for evaluation  The history is provided by the patient.    Past Medical History  Diagnosis Date  . HTN (hypertension)   . Dyslipidemia   . Diverticulosis   . Arthritis   . Diverticulitis   . PONV (postoperative nausea and vomiting)     N&V when awakening from anesthesia  . Dysrhythmia     irregular heart beat  . Shortness of breath     saw cardiologist for this  . UTI (urinary tract infection)     history of UTI  . Jaundice, obstructive, intrahepatic     s/p ERCP, lithotripsy, and stone removal 01/2011, 02/2011    Past Surgical History  Procedure Date  . Total knee arthroplasty     RIGHT KNEE  . Knee surgery     LEFT KNEE  . Total abdominal hysterectomy   . Cholecystectomy     removal of gallstones x4 (?ERCP)  . Foot surgery     x2  . Ercp     with lithotripsy and gall stone removal 2012  . Total knee arthroplasty 07/13/2011    Procedure: TOTAL KNEE ARTHROPLASTY;  Surgeon: Nestor Lewandowsky;  Location: [facility] OR;  Service: Orthopedics;  Laterality: Left;  Left Total Knee Arthroplasty with Hardware Removal     Family History  Problem Relation Age of Onset  . Pneumonia Mother   . Lung cancer Sister   . Hypertension Maternal Grandmother   . Stroke Maternal Grandmother     History  Substance Use Topics  . Smoking status: Former Smoker -- 0.5 packs/day for 40 years    Types: Cigarettes    Quit date: 07/05/1996  .  Smokeless tobacco: Never Used  . Alcohol Use: 0.6 oz/week    1 Glasses of wine per week     occasional    OB History    Grav Para Term Preterm Abortions TAB SAB Ect Mult Living                  Review of Systems  Constitutional: Negative.   Eyes: Negative.   Respiratory: Negative for shortness of breath.   Gastrointestinal: Positive for abdominal pain and blood in stool. Negative for rectal pain.  Genitourinary: Negative for hematuria.  Musculoskeletal: Negative for back pain.  Skin: Negative for pallor.  Neurological: Negative for dizziness and weakness.    Allergies  Amitriptyline; Gabapentin; Meloxicam; and Tramadol  Home Medications   Current Outpatient Rx  Name Route Sig Dispense Refill  . CELECOXIB 200 MG PO CAPS Oral Take 1 capsule (200 mg total) by mouth daily. 30 capsule 2  . FERROUS SULFATE 325 (65 FE) MG PO TABS Oral Take 325 mg by mouth daily.      Marland Kitchen HYDROCODONE-ACETAMINOPHEN 5-325 MG PO TABS Oral Take 1-2 tablets by mouth every 4 (four) hours as needed. For pain.    Marland Kitchen METAXALONE 800 MG PO TABS Oral Take 1 tablet (800 mg total) by mouth  3 (three) times daily. 30 tablet 0  . METFORMIN HCL 500 MG PO TABS Oral Take 500 mg by mouth 2 (two) times daily with a meal.      . NIACIN 500 MG PO TABS Oral Take 500 mg by mouth at bedtime.      Marland Kitchen SIMVASTATIN 20 MG PO TABS Oral Take 20 mg by mouth at bedtime.      . WARFARIN SODIUM 3 MG PO TABS Oral Take 3 mg by mouth daily.        BP 129/69  Pulse 88  Temp(Src) 98.9 F (37.2 C) (Oral)  Resp 16  SpO2 100%  Physical Exam  Constitutional: She is oriented to person, place, and time. She appears well-developed and well-nourished.  HENT:  Head: Normocephalic.  Eyes: Pupils are equal, round, and reactive to light.  Neck: Normal range of motion.  Cardiovascular: Normal rate.   Pulmonary/Chest: Effort normal.  Abdominal: Bowel sounds are normal. She exhibits no distension. There is no tenderness.  Musculoskeletal: Normal  range of motion.  Neurological: She is oriented to person, place, and time.  Skin: Skin is warm and dry.  Psychiatric: She has a normal mood and affect.    ED Course  Procedures (including critical care time)  Labs Reviewed  CBC - Abnormal; Notable for the following:    RBC 2.99 (*)    Hemoglobin 8.6 (*)    HCT 26.2 (*)    RDW 16.6 (*)    Platelets 653 (*)    All other components within normal limits  PROTIME-INR - Abnormal; Notable for the following:    Prothrombin Time 32.2 (*)    INR 3.07 (*)    All other components within normal limits  BASIC METABOLIC PANEL  POCT OCCULT BLOOD STOOL, DEVICE   No results found.   No diagnosis found.    MDM  Will evaluate lab values.  She is guaiac-positive        Arman Filter, NP 07/30/11 2358

## 2011-07-31 ENCOUNTER — Encounter (HOSPITAL_COMMUNITY): Payer: Self-pay | Admitting: Family Medicine

## 2011-07-31 DIAGNOSIS — M79609 Pain in unspecified limb: Secondary | ICD-10-CM

## 2011-07-31 DIAGNOSIS — K922 Gastrointestinal hemorrhage, unspecified: Secondary | ICD-10-CM | POA: Diagnosis present

## 2011-07-31 DIAGNOSIS — M7989 Other specified soft tissue disorders: Secondary | ICD-10-CM

## 2011-07-31 DIAGNOSIS — D649 Anemia, unspecified: Secondary | ICD-10-CM | POA: Diagnosis present

## 2011-07-31 DIAGNOSIS — K5790 Diverticulosis of intestine, part unspecified, without perforation or abscess without bleeding: Secondary | ICD-10-CM

## 2011-07-31 LAB — BASIC METABOLIC PANEL
BUN: 22 mg/dL (ref 6–23)
GFR calc Af Amer: 63 mL/min — ABNORMAL LOW (ref >90)
GFR calc non Af Amer: 55 mL/min — ABNORMAL LOW (ref >90)
Potassium: 4.6 mEq/L (ref 3.5–5.1)
Sodium: 137 mEq/L (ref 135–145)

## 2011-07-31 LAB — RETICULOCYTES: Retic Ct Pct: 4.6 % — ABNORMAL HIGH (ref 0.4–3.1)

## 2011-07-31 LAB — IRON AND TIBC
Saturation Ratios: 10 % — ABNORMAL LOW (ref 20–55)
TIBC: 293 ug/dL (ref 250–470)

## 2011-07-31 LAB — HEMOGLOBIN AND HEMATOCRIT, BLOOD: Hemoglobin: 8.7 g/dL — ABNORMAL LOW (ref 12.0–15.0)

## 2011-07-31 LAB — PROTIME-INR: INR: 1.63 — ABNORMAL HIGH (ref 0.00–1.49)

## 2011-07-31 LAB — FERRITIN: Ferritin: 92 ng/mL (ref 10–291)

## 2011-07-31 LAB — GLUCOSE, CAPILLARY
Glucose-Capillary: 105 mg/dL — ABNORMAL HIGH (ref 70–99)
Glucose-Capillary: 98 mg/dL (ref 70–99)

## 2011-07-31 LAB — TYPE AND SCREEN: Antibody Screen: NEGATIVE

## 2011-07-31 LAB — OCCULT BLOOD, POC DEVICE: Fecal Occult Bld: POSITIVE

## 2011-07-31 MED ORDER — PANTOPRAZOLE SODIUM 40 MG IV SOLR
40.0000 mg | INTRAVENOUS | Status: DC
  Administered 2011-07-31 – 2011-08-02 (×3): 40 mg via INTRAVENOUS
  Filled 2011-07-31 (×4): qty 40

## 2011-07-31 MED ORDER — INSULIN ASPART 100 UNIT/ML ~~LOC~~ SOLN: 0.0000 [IU] | Freq: Every day | SUBCUTANEOUS | Status: DC

## 2011-07-31 MED ORDER — CHLORHEXIDINE GLUCONATE 0.12 % MT SOLN
15.0000 mL | Freq: Two times a day (BID) | OROMUCOSAL | Status: DC
  Administered 2011-07-31 – 2011-08-02 (×4): 15 mL via OROMUCOSAL
  Filled 2011-07-31 (×5): qty 15

## 2011-07-31 MED ORDER — METAXALONE 800 MG PO TABS
800.0000 mg | ORAL_TABLET | Freq: Three times a day (TID) | ORAL | Status: DC
  Administered 2011-07-31 – 2011-08-02 (×6): 800 mg via ORAL
  Filled 2011-07-31 (×9): qty 1

## 2011-07-31 MED ORDER — POTASSIUM CHLORIDE IN NACL 20-0.9 MEQ/L-% IV SOLN
INTRAVENOUS | Status: DC
  Filled 2011-07-31 (×2): qty 1000

## 2011-07-31 MED ORDER — ACETAMINOPHEN 650 MG RE SUPP: 650.0000 mg | Freq: Four times a day (QID) | RECTAL | Status: DC | PRN

## 2011-07-31 MED ORDER — SIMVASTATIN 20 MG PO TABS
20.0000 mg | ORAL_TABLET | Freq: Every day | ORAL | Status: DC
  Administered 2011-07-31 – 2011-08-01 (×2): 20 mg via ORAL
  Filled 2011-07-31 (×3): qty 1

## 2011-07-31 MED ORDER — METFORMIN HCL 500 MG PO TABS
500.0000 mg | ORAL_TABLET | Freq: Two times a day (BID) | ORAL | Status: DC
Start: 2011-07-31 — End: 2011-07-31
  Filled 2011-07-31 (×2): qty 1

## 2011-07-31 MED ORDER — INSULIN ASPART 100 UNIT/ML ~~LOC~~ SOLN: 0.0000 [IU] | Freq: Three times a day (TID) | SUBCUTANEOUS | Status: DC

## 2011-07-31 MED ORDER — ACETAMINOPHEN 325 MG PO TABS: 650.0000 mg | ORAL_TABLET | Freq: Four times a day (QID) | ORAL | Status: DC | PRN

## 2011-07-31 MED ORDER — BIOTENE DRY MOUTH MT LIQD
15.0000 mL | Freq: Two times a day (BID) | OROMUCOSAL | Status: DC
  Administered 2011-07-31 – 2011-08-01 (×3): 15 mL via OROMUCOSAL

## 2011-07-31 MED ORDER — PHYTONADIONE 5 MG PO TABS
10.0000 mg | ORAL_TABLET | Freq: Once | ORAL | Status: AC
  Administered 2011-07-31: 10 mg via ORAL
  Filled 2011-07-31: qty 2

## 2011-07-31 MED ORDER — DEXTROSE-NACL 5-0.45 % IV SOLN
INTRAVENOUS | Status: DC
  Administered 2011-07-31 – 2011-08-01 (×3): via INTRAVENOUS
  Filled 2011-07-31 (×2): qty 1000

## 2011-07-31 MED ORDER — INSULIN ASPART 100 UNIT/ML ~~LOC~~ SOLN
0.0000 [IU] | SUBCUTANEOUS | Status: DC
  Filled 2011-07-31: qty 3

## 2011-07-31 MED ORDER — HYDROCODONE-ACETAMINOPHEN 5-325 MG PO TABS
1.0000 | ORAL_TABLET | ORAL | Status: DC | PRN
  Administered 2011-07-31: 1 via ORAL
  Administered 2011-08-01: 2 via ORAL
  Filled 2011-07-31: qty 2
  Filled 2011-07-31: qty 1

## 2011-07-31 MED ORDER — MORPHINE SULFATE 2 MG/ML IJ SOLN: 2.0000 mg | INTRAMUSCULAR | Status: DC | PRN

## 2011-07-31 NOTE — ED Notes (Signed)
Family at bedside. 

## 2011-07-31 NOTE — Progress Notes (Signed)
Patient arrived at 1620 to her room.  When I reviewed the orders, she had an order for Telemetry monitoring.  Also, patient's CBG's were ordered every 6 hours but her insulin coverage was AC/HS.  Dr. Rito Ehrlich notified and he placed orders to discontinue telemetry monitoring and to change CBG's to every 4 hours with insulin coverage.  Allayne Butcher [facility]  07/31/2011

## 2011-07-31 NOTE — ED Notes (Signed)
Attempted to call report to floor nurse x1. 

## 2011-07-31 NOTE — Consult Note (Signed)
Reason for Consult:Hematochezia Referring Physician: [facility]  Othello Sgroi HPI: This is an 78 year old female who presents with painless hematochezia x 4 days.  She was brought to the ER when she had a witness bout of hematochezia.  There is no associated abdominal pain with her symptoms, however, she has a chronic LLQ pain.  No problems with nausea, diarrhea, or constipation, but she did vomit a couple of times before her admission.  No complaints of chest pain or SOB.  Approximately 3 years ago Dr. Loreta Ave performed a routine colonoscopy and it was reported that she had diverticula.  Currently her INR is at 3.0 as a result of coumadin for her recent knee replacement.  Past Medical History  Diagnosis Date  . HTN (hypertension)   . Dyslipidemia   . Diverticulosis   . Arthritis   . Diverticulitis   . PONV (postoperative nausea and vomiting)     N&V when awakening from anesthesia  . Dysrhythmia     irregular heart beat  . Shortness of breath     saw cardiologist for this  . UTI (urinary tract infection)     history of UTI  . Jaundice, obstructive, intrahepatic     s/p ERCP, lithotripsy, and stone removal 01/2011, 02/2011    Past Surgical History  Procedure Date  . Total knee arthroplasty     RIGHT KNEE  . Knee surgery     LEFT KNEE  . Total abdominal hysterectomy   . Cholecystectomy     removal of gallstones x4 (?ERCP)  . Foot surgery     x2  . Ercp     with lithotripsy and gall stone removal 2012  . Total knee arthroplasty 07/13/2011    Procedure: TOTAL KNEE ARTHROPLASTY;  Surgeon: Nestor Lewandowsky;  Location: [facility] OR;  Service: Orthopedics;  Laterality: Left;  Left Total Knee Arthroplasty with Hardware Removal     Family History  Problem Relation Age of Onset  . Pneumonia Mother   . Lung cancer Sister   . Hypertension Maternal Grandmother   . Stroke Maternal Grandmother     Social History:  reports that she quit smoking about 15 years ago. Her smoking use  included Cigarettes. She has a 20 pack-year smoking history. She has never used smokeless tobacco. She reports that she drinks about .6 ounces of alcohol per week. She reports that she does not use illicit drugs.  Allergies:  Allergies  Allergen Reactions  . Amitriptyline   . Gabapentin   . Meloxicam   . Tramadol     Medications:  Scheduled:   . insulin aspart  0-15 Units Subcutaneous TID WC  . insulin aspart  0-5 Units Subcutaneous QHS  . metaxalone  800 mg Oral TID  . metFORMIN  500 mg Oral BID WC  . pantoprazole (PROTONIX) IV  40 mg Intravenous Q24H  . phytonadione  10 mg Oral Once  . simvastatin  20 mg Oral QHS   Continuous:   . 0.9 % NaCl with KCl 20 mEq / L      Results for orders placed during the hospital encounter of 07/30/11 (from the past 24 hour(s))  CBC     Status: Abnormal   Collection Time   07/30/11 11:05 PM      Component Value Range   WBC 10.3  4.0 - 10.5 (K/uL)   RBC 2.99 (*) 3.87 - 5.11 (MIL/uL)   Hemoglobin 8.6 (*) 12.0 - 15.0 (g/dL)   HCT 16.1 (*) 09.6 -  46.0 (%)   MCV 87.6  78.0 - 100.0 (fL)   MCH 28.8  26.0 - 34.0 (pg)   MCHC 32.8  30.0 - 36.0 (g/dL)   RDW 47.8 (*) 29.5 - 15.5 (%)   Platelets 653 (*) 150 - 400 (K/uL)  PROTIME-INR     Status: Abnormal   Collection Time   07/30/11 11:05 PM      Component Value Range   Prothrombin Time 32.2 (*) 11.6 - 15.2 (seconds)   INR 3.07 (*) 0.00 - 1.49   BASIC METABOLIC PANEL     Status: Abnormal   Collection Time   07/30/11 11:05 PM      Component Value Range   Sodium 137  135 - 145 (mEq/L)   Potassium 4.6  3.5 - 5.1 (mEq/L)   Chloride 105  96 - 112 (mEq/L)   CO2 24  19 - 32 (mEq/L)   Glucose, Bld 90  70 - 99 (mg/dL)   BUN 22  6 - 23 (mg/dL)   Creatinine, Ser 6.21  0.50 - 1.10 (mg/dL)   Calcium 9.6  8.4 - 30.8 (mg/dL)   GFR calc non Af Amer 55 (*) >90 (mL/min)   GFR calc Af Amer 63 (*) >90 (mL/min)  PREPARE FRESH FROZEN PLASMA     Status: Normal (Preliminary result)   Collection Time   07/31/11  5:30  AM      Component Value Range   Unit Number [id number]     Blood Component Type THAWED PLASMA     Unit division 00     Status of Unit ALLOCATED     Transfusion Status OK TO TRANSFUSE     Unit Number [id number]     Blood Component Type THAWED PLASMA     Unit division 00     Status of Unit ALLOCATED     Transfusion Status OK TO TRANSFUSE    RETICULOCYTES     Status: Abnormal   Collection Time   07/31/11  5:40 AM      Component Value Range   Retic Ct Pct 4.6 (*) 0.4 - 3.1 (%)   RBC. 3.13 (*) 3.87 - 5.11 (MIL/uL)   Retic Count, Manual 144.0  19.0 - 186.0 (K/uL)  TYPE AND SCREEN     Status: Normal   Collection Time   07/31/11  5:40 AM      Component Value Range   ABO/RH(D) O POS     Antibody Screen NEG     Sample Expiration 08/03/2011    ABO/RH     Status: Normal   Collection Time   07/31/11  5:40 AM      Component Value Range   ABO/RH(D) O POS       No results found.  ROS:  As stated above in the HPI otherwise negative.  Blood pressure 128/62, pulse 90, temperature 98.7 F (37.1 C), temperature source Oral, resp. rate 16, SpO2 95.00%.    PE: Gen: NAD, Alert and Oriented HEENT:  [place]/AT, EOMI Neck: Supple, no LAD Lungs: CTA Bilaterally CV: RRR without M/G/R ABM: Soft, NTND, +BS Ext: No C/C/E Rectal: Reddish stool, heme positive  Assessment/Plan: 1) Hematochezia - Probable diverticular bleed. 2) Heme positive stool. 3) History of diverticulosis. 4) Anemia secondary to the bleed.   The patient is hemodynamically stable.  I feel that her bleeding is secondary to a diverticular bleed.  She does have some complaints of LLQ pain, but this is not a new issue.  I do not feel that  she has an ischemic colitis.  Her bleed is compounded by the coumadin, but she is going to be reversed.    Plan: 1) Follow HGB. 2) Transfuse if necessary. 3) Hold on any GI interventions at this time.  Brentt Fread D 07/31/2011, 7:49 AM

## 2011-07-31 NOTE — ED Notes (Signed)
Vital signs stable. 

## 2011-07-31 NOTE — Plan of Care (Signed)
PT/OT/ST Cancellation Note  ___Treatment cancelled today due to medical issues with patient which prohibited therapy  _x__ Treatment cancelled today due to patient receiving procedure or test. Pt currently receiving blood transfusion per RN. Will check back another time/day for PT evaluation. Thanks.  ___ Treatment cancelled today due to patient's refusal to participate   ___ Treatment cancelled today due to   Signature: Carmina Miller, PT [phone]

## 2011-07-31 NOTE — ED Notes (Signed)
Patient is resting comfortably. 

## 2011-07-31 NOTE — ED Provider Notes (Signed)
Medical screening examination/treatment/procedure(s) were conducted as a shared visit with non-physician practitioner(s) and myself.  I personally evaluated the patient during the encounter  Pt seen and examined, to be admitted for eval of bleeding and tx of anemia  Toy Baker, MD 07/31/11 [phone]

## 2011-07-31 NOTE — ED Notes (Signed)
Pt's family #  Bonita Quin [phone] Jonny Ruiz [phone]

## 2011-07-31 NOTE — H&P (Signed)
PCP:   Emeterio Reeve, MD, MD  Orthopedics: Dr Elita Quick  GI; Dr Loreta Ave  Chief Complaint:  BRBPR  HPI: This is a 78y/o female, with known history of diverticulosis. She had bright red blood per rectum. Today she had current colored stool. She recently had a left knee replacements and is on Coumadin for DVT prophylaxis. Her last colonoscopy was approximately 3 years ago by Dr. Loreta Ave, she has a history of diverticulosis. She reports some left lower quadrant pain but this is chronic. She had some nausea but no vomiting or diarrhea. She was transferred to the ER. Here in the ER she is grossly guiac positive.  Review of Systems: positives bolded The patient denies anorexia, fever, weight loss,, vision loss, decreased hearing, hoarseness, chest pain, syncope, dyspnea on exertion, peripheral edema, balance deficits, hemoptysis, abdominal pain, melena, hematochezia, severe indigestion/heartburn, hematuria, incontinence, genital sores, muscle weakness, suspicious skin lesions, transient blindness, difficulty walking, depression, unusual weight change, abnormal bleeding, enlarged lymph nodes, angioedema, and breast masses.  Past Medical History: Past Medical History  Diagnosis Date  . HTN (hypertension)   . Dyslipidemia   . Diverticulosis   . Arthritis   . Diverticulitis   . PONV (postoperative nausea and vomiting)     N&V when awakening from anesthesia  . Dysrhythmia     irregular heart beat  . Shortness of breath     saw cardiologist for this  . UTI (urinary tract infection)     history of UTI  . Jaundice, obstructive, intrahepatic     s/p ERCP, lithotripsy, and stone removal 01/2011, 02/2011   Past Surgical History  Procedure Date  . Total knee arthroplasty     RIGHT KNEE  . Knee surgery     LEFT KNEE  . Total abdominal hysterectomy   . Cholecystectomy     removal of gallstones x4 (?ERCP)  . Foot surgery     x2  . Ercp     with lithotripsy and gall stone removal 2012  . Total knee  arthroplasty 07/13/2011    Procedure: TOTAL KNEE ARTHROPLASTY;  Surgeon: Nestor Lewandowsky;  Location: [facility] OR;  Service: Orthopedics;  Laterality: Left;  Left Total Knee Arthroplasty with Hardware Removal     Medications: Prior to Admission medications   Medication Sig Start Date End Date Taking? Authorizing Provider  celecoxib (CELEBREX) 200 MG capsule Take 1 capsule (200 mg total) by mouth daily. 10/23/10  Yes Elyn Aquas., MD  ferrous sulfate 325 (65 FE) MG tablet Take 325 mg by mouth daily.     Yes Historical Provider, MD  HYDROcodone-acetaminophen (NORCO) 5-325 MG per tablet Take 1-2 tablets by mouth every 4 (four) hours as needed. For pain.   Yes Historical Provider, MD  metaxalone (SKELAXIN) 800 MG tablet Take 1 tablet (800 mg total) by mouth 3 (three) times daily. 10/23/10  Yes Elyn Aquas., MD  metFORMIN (GLUCOPHAGE) 500 MG tablet Take 500 mg by mouth 2 (two) times daily with a meal.     Yes Historical Provider, MD  niacin 500 MG tablet Take 500 mg by mouth at bedtime.     Yes Historical Provider, MD  simvastatin (ZOCOR) 20 MG tablet Take 20 mg by mouth at bedtime.     Yes Historical Provider, MD  warfarin (COUMADIN) 3 MG tablet Take 3 mg by mouth daily.     Yes Historical Provider, MD    Allergies:   Allergies  Allergen Reactions  . Amitriptyline   . Gabapentin   .  Meloxicam   . Tramadol     Social History:  reports that she quit smoking about 15 years ago. Her smoking use included Cigarettes. She has a 20 pack-year smoking history. She has never used smokeless tobacco. She reports that she drinks about .6 ounces of alcohol per week. She reports that she does not use illicit drugs.  Family History: Family History  Problem Relation Age of Onset  . Pneumonia Mother   . Lung cancer Sister   . Hypertension Maternal Grandmother   . Stroke Maternal Grandmother     Physical Exam: Filed Vitals:   07/30/11 2036 07/30/11 2318 07/31/11 0048  BP: 119/64 129/69 126/65    Pulse: 90 88 93  Temp: 98.2 F (36.8 C) 98.9 F (37.2 C) 98.8 F (37.1 C)  TempSrc: Oral Oral   Resp: 16 16 20   SpO2: 100% 100% 99%    General:  Alert and oriented times three, well developed and nourished, no acute distress Eyes: PERRLA, pink conjunctiva, no scleral icterus ENT: Moist oral mucosa, neck supple, no thyromegaly Lungs: clear to ascultation, no wheeze, no crackles, no use of accessory muscles Cardiovascular: regular rate and rhythm, no regurgitation, no gallops, no murmurs. No carotid bruits, no JVD Abdomen: soft, positive BS, LLQ tenderness (chronic), non-distended, no organomegaly, not an acute abdomen GU: not examined Neuro: CN II - XII grossly intact, sensation intact Musculoskeletal: strength 5/5 all extremities, no clubbing, cyanosis or edema, s/p left knee surgery, no evidence of infection Skin: no rash, no subcutaneous crepitation, no decubitus Psych: appropriate patient   Labs on Admission:   Basename 07/30/11 2305  NA 137  K 4.6  CL 105  CO2 24  GLUCOSE 90  BUN 22  CREATININE 0.94  CALCIUM 9.6  MG --  PHOS --   No results found for this basename: AST:2,ALT:2,ALKPHOS:2,BILITOT:2,PROT:2,ALBUMIN:2 in the last 72 hours No results found for this basename: LIPASE:2,AMYLASE:2 in the last 72 hours  Basename 07/30/11 2305  WBC 10.3  NEUTROABS --  HGB 8.6*  HCT 26.2*  MCV 87.6  PLT 653*   No results found for this basename: CKTOTAL:3,CKMB:3,CKMBINDEX:3,TROPONINI:3 in the last 72 hours No components found with this basename: POCBNP:3 No results found for this basename: DDIMER:2 in the last 72 hours No results found for this basename: HGBA1C:2 in the last 72 hours No results found for this basename: CHOL:2,HDL:2,LDLCALC:2,TRIG:2,CHOLHDL:2,LDLDIRECT:2 in the last 72 hours No results found for this basename: TSH,T4TOTAL,FREET3,T3FREE,THYROIDAB in the last 72 hours No results found for this basename:  VITAMINB12:2,FOLATE:2,FERRITIN:2,TIBC:2,IRON:2,RETICCTPCT:2 in the last 72 hours  Micro Results: No results found for this or any previous visit (from the past 240 hour(s)).   Radiological Exams on Admission: No results found.  Assessment/Plan Present on Admission:  .GI bleed H/o Diverticulosis Admit to MedSurg N.p.o. IV fluid hydration, IV Protonix  serial H&H ordered, anemia panel ordered  Coumadin and asa discontinued  Vitamin k and FFP given  We'll consult Dr. Loreta Ave in the a.m.  Patient Has been typed and crossed  .Hypertension .Hyperlipidemia  resume home medications   Full code  SCDs for prophylaxis  Team 3 /Dr. Rito Ehrlich    In 4:25 AM  Time out  5:05 AM  Vi Biddinger 07/31/2011, 5:08 AM

## 2011-07-31 NOTE — Progress Notes (Signed)
*  PRELIMINARY RESULTS* Left lower extremity venous duplex completed. No evidence of DVT, superficial thrombosis, or Baker's cystMilta Deiters, [place] D 07/31/2011, 3:57 PM

## 2011-07-31 NOTE — Progress Notes (Signed)
Patient admitted earlier today. H&P reviewed.  Patient seen and examined. She is complaining of pain in left leg. Recent knee replacement in Dec 2012. No further rectal bleeding.  Vitals stable. Lungs clear.  Cardiac: Normal exam. Abdomen: Soft, NT, ND, BS+, No masses or organomegaly. Left LE: No erythema over knee. Some calf tenderness is present.   Will get Venous doppler to r/o DVT though less likely as she was on warfarin. Continue to monitor Hgb. Repeat INR pending. GI following.  Kimberly Rasmussen 07/31/2011 3:21 PM

## 2011-08-01 DIAGNOSIS — D649 Anemia, unspecified: Secondary | ICD-10-CM

## 2011-08-01 DIAGNOSIS — K5730 Diverticulosis of large intestine without perforation or abscess without bleeding: Secondary | ICD-10-CM

## 2011-08-01 DIAGNOSIS — K922 Gastrointestinal hemorrhage, unspecified: Secondary | ICD-10-CM

## 2011-08-01 LAB — GLUCOSE, CAPILLARY
Glucose-Capillary: 111 mg/dL — ABNORMAL HIGH (ref 70–99)
Glucose-Capillary: 99 mg/dL (ref 70–99)

## 2011-08-01 LAB — URINALYSIS, ROUTINE W REFLEX MICROSCOPIC
Bilirubin Urine: NEGATIVE
Nitrite: NEGATIVE
Specific Gravity, Urine: 1.012 (ref 1.005–1.030)
pH: 6 (ref 5.0–8.0)

## 2011-08-01 LAB — BASIC METABOLIC PANEL
Calcium: 9.6 mg/dL (ref 8.4–10.5)
Creatinine, Ser: 0.7 mg/dL (ref 0.50–1.10)
GFR calc Af Amer: >90 mL/min (ref >90)

## 2011-08-01 LAB — URINE MICROSCOPIC-ADD ON

## 2011-08-01 LAB — HEMOGLOBIN A1C
Hgb A1c MFr Bld: 5.4 % (ref <5.7)
Mean Plasma Glucose: 108 mg/dL (ref <117)

## 2011-08-01 LAB — PREPARE FRESH FROZEN PLASMA: Unit division: 0

## 2011-08-01 LAB — PROTIME-INR: Prothrombin Time: 18.7 seconds — ABNORMAL HIGH (ref 11.6–15.2)

## 2011-08-01 LAB — HEMOGLOBIN AND HEMATOCRIT, BLOOD
HCT: 28.5 % — ABNORMAL LOW (ref 36.0–46.0)
HCT: 29.3 % — ABNORMAL LOW (ref 36.0–46.0)

## 2011-08-01 MED ORDER — INSULIN ASPART 100 UNIT/ML ~~LOC~~ SOLN
0.0000 [IU] | Freq: Three times a day (TID) | SUBCUTANEOUS | Status: DC
  Filled 2011-08-01: qty 3

## 2011-08-01 MED ORDER — LOPERAMIDE HCL 2 MG PO CAPS
2.0000 mg | ORAL_CAPSULE | Freq: Three times a day (TID) | ORAL | Status: DC | PRN
  Administered 2011-08-01: 2 mg via ORAL
  Filled 2011-08-01: qty 1

## 2011-08-01 NOTE — Progress Notes (Signed)
Patient seen and I agree with the above documentation, including the assessment and plan.  

## 2011-08-01 NOTE — Progress Notes (Signed)
PCP: Emeterio Reeve, MD, MD  Brief HPI:   This is a 78y/o female, with known history of diverticulosis. She had bright red blood per rectum over last few days. She recently had a left knee replacements and was on Coumadin for DVT prophylaxis. Her last colonoscopy was approximately 3 years ago by Dr. Loreta Ave, she has a history of diverticulosis. She reported some left lower quadrant pain but this is chronic. She had some nausea but no vomiting or diarrhea. She was transferred to the ER. In the ER she was grossly guiac positive.  Consultants: Dr. Elnoria Howard  Procedures: None so far  Subjective: Patient feels well. No complaints offered. No bloody stools. Complaining of left flank pain with frequent urination. No burning. No nausea/vomiting.  Objective: Vital signs in last 24 hours: Temp:  [98.2 F (36.8 C)-98.9 F (37.2 C)] 98.2 F (36.8 C) (01/05 0521) Pulse Rate:  [72-93] 93  (01/05 0521) Resp:  [12-16] 16  (01/05 0521) BP: (129-162)/(65-78) 146/78 mmHg (01/05 0521) SpO2:  [94 %-99 %] 99 % (01/05 0521) Weight:  [65.8 kg (145 lb 1 oz)] 145 lb 1 oz (65.8 kg) (01/04 1621) Weight change:  Last BM Date: 07/30/11  Intake/Output from previous day: 01/04 0701 - 01/05 0700 In: 600 [Blood:600] Out: 1350 [Urine:1350] Intake/Output this shift:    General appearance: alert, cooperative, appears stated age and no distress Back: symmetric, no curvature. ROM normal. No CVA tenderness., no paplpable tenderness. Resp: clear to auscultation bilaterally Cardio: regular rate and rhythm, S1, S2 normal, no murmur, click, rub or gallop GI: soft, non-tender; bowel sounds normal; no masses,  no organomegaly Extremities: left knee shows incision without any erythema. Some calf tenderness. Skin: Skin color, texture, turgor normal. No rashes or lesions Neurologic: Grossly normal  Lab Results:  Basename 08/01/11 0900 08/01/11 0101 07/30/11 2305  WBC -- -- 10.3  HGB 9.3* 8.3* --  HCT 28.5* 25.7* --  PLT  -- -- 653*   BMET  Basename 08/01/11 0334 07/30/11 2305  NA 138 137  K 3.7 4.6  CL 106 105  CO2 24 24  GLUCOSE 92 90  BUN 10 22  CREATININE 0.70 0.94  CALCIUM 9.6 9.6    Studies/Results: No results found.  Medications:  Scheduled:   . antiseptic oral rinse  15 mL Mouth Rinse q12n4p  . chlorhexidine  15 mL Mouth Rinse BID  . insulin aspart  0-9 Units Subcutaneous TID WC  . metaxalone  800 mg Oral TID  . pantoprazole (PROTONIX) IV  40 mg Intravenous Q24H  . simvastatin  20 mg Oral QHS  . DISCONTD: insulin aspart  0-15 Units Subcutaneous TID WC  . DISCONTD: insulin aspart  0-5 Units Subcutaneous QHS  . DISCONTD: insulin aspart  0-9 Units Subcutaneous Q4H    Assessment/Plan:  Principal Problem:  *GI bleed Active Problems:  Hypertension  Hyperlipidemia  Anemia  Diverticulosis    1. Rectal Bleeding/Lower Gi Bleed: Likely diverticular exacerbated by warfarin. No bleeding since yesterday. HGB did drop but then stable. Continue PPI. Dr. Elnoria Howard following. Can advise diet as no endoscopies planned.  2. Acute Blood Loss Anemia: Due to Gi bleed. No need for transfusion at this point.  3. Left leg pain: No DVt seen. Pain likely post-op.  4. Frequent urination with left flank pain: Check UA  5. On Anticoagluation: Was on warfarin after knee replacement for DVt prophylaxis. Received Vit K and FFP. INR less than 2. Cannot take warfarin any more. Is out about 2 weeks from  surgery. Will utilize Teds.  6. PT/OT eval  7. If remains stable can go home 1/6.    LOS: 2 days   [facility] Pager [phone] 08/01/2011, 9:43 AM

## 2011-08-01 NOTE — Progress Notes (Signed)
Patient ID: Kimberly Rasmussen, female   DOB: Sep 24, 1925, 78 y.o.   MRN: [id number] [facility] Progress Note  Subjective: She feels fine, has no c/o. She has not had any further bleeding since admit. She had been on coumadin post knee replacement about 2 weeks ago. Inr has been corrected.  Objective:  Vital signs in last 24 hours: Temp:  [98.2 F (36.8 C)-98.9 F (37.2 C)] 98.2 F (36.8 C) (01/05 0521) Pulse Rate:  [72-93] 93  (01/05 0521) Resp:  [12-16] 16  (01/05 0521) BP: (129-162)/(65-78) 146/78 mmHg (01/05 0521) SpO2:  [94 %-99 %] 99 % (01/05 0521) Weight:  [65.8 kg (145 lb 1 oz)] 145 lb 1 oz (65.8 kg) (01/04 1621) Last BM Date: 07/30/11 General:   Alert,  Well-developed,    in NAD Heart:  Regular rate and rhythm; no murmurs Pulm;clear Abdomen:  Soft, nontender and nondistended. Normal bowel sounds, without guarding Extremities:  Without edema. Neurologic:  Alert and  oriented x4;  grossly normal neurologically. Psych:  Alert and cooperative. Normal mood and affect.  Intake/Output from previous day: 01/04 0701 - 01/05 0700 In: 600 [Blood:600] Out: 1350 [Urine:1350] Intake/Output this shift:    Lab Results:  Basename 08/01/11 0900 08/01/11 0101 07/31/11 1730 07/30/11 2305  WBC -- -- -- 10.3  HGB 9.3* 8.3* 8.7* --  HCT 28.5* 25.7* 26.9* --  PLT -- -- -- 653*   BMET  Basename 08/01/11 0334 07/30/11 2305  NA 138 137  K 3.7 4.6  CL 106 105  CO2 24 24  GLUCOSE 92 90  BUN 10 22  CREATININE 0.70 0.94  CALCIUM 9.6 9.6       Basename 08/01/11 0334 07/31/11 1555  LABPROT 18.7* 19.6*  INR 1.53* 1.63*     Assessment / Plan: #1 78 yo female with presumed diverticular bleed  -known diverticular disease. She is stable, no active bleeding since admit. Bleed exacerbated by recent anticoagualtion which has been stopped.  Continue observation Advance diet. No endoscopic intervention needed unless she rebleeds off coumadin. Principal Problem:  *GI  bleed Active Problems:  Hypertension  Hyperlipidemia  Anemia  Diverticulosis     LOS: 2 days   Kimberly Rasmussen  08/01/2011, 10:27 AM

## 2011-08-02 LAB — BASIC METABOLIC PANEL
BUN: 7 mg/dL (ref 6–23)
Calcium: 9.6 mg/dL (ref 8.4–10.5)
GFR calc Af Amer: 88 mL/min — ABNORMAL LOW (ref >90)
GFR calc non Af Amer: 76 mL/min — ABNORMAL LOW (ref >90)
Potassium: 4 mEq/L (ref 3.5–5.1)
Sodium: 139 mEq/L (ref 135–145)

## 2011-08-02 LAB — CBC
MCH: 28 pg (ref 26.0–34.0)
MCHC: 31.9 g/dL (ref 30.0–36.0)
RDW: 16 % — ABNORMAL HIGH (ref 11.5–15.5)

## 2011-08-02 MED ORDER — METAXALONE 800 MG PO TABS: 800.0000 mg | ORAL_TABLET | Freq: Three times a day (TID) | ORAL | Status: DC

## 2011-08-02 MED ORDER — HYDROCODONE-ACETAMINOPHEN 5-325 MG PO TABS: 1.0000 | ORAL_TABLET | ORAL | Status: DC | PRN

## 2011-08-02 MED ORDER — CIPROFLOXACIN HCL 250 MG PO TABS: 250.0000 mg | ORAL_TABLET | Freq: Two times a day (BID) | ORAL | Status: AC

## 2011-08-02 NOTE — Progress Notes (Signed)
Clinical Social Work received consult that patient is from [facility], however patient was actually just discharged from SNF because she was doing so well.  Daughter in room , along with MD, discussed home health options and DME.  Both agreeable for home health with plan for dc today home with daughter.  Notified CM regarding disposition and she is aware.  No other needs from CSW.  Beth Spackman Nail, MSW LCSW [phone] weekend coverage.

## 2011-08-02 NOTE — Progress Notes (Signed)
Patient discharged via wheelchair with daughter to home.  Discharge instructions and prescriptions given to patient.  No change from morning assessment.

## 2011-08-02 NOTE — Discharge Summary (Signed)
Physician Discharge Summary  Patient ID: Kimberly Rasmussen MRN: [id number] DOB/AGE: 1925-09-03 78 y.o.  Admit date: 07/30/2011 Discharge date: 08/02/2011  Discharge Diagnoses:  Principal Problem:  *GI bleed Active Problems:  Hypertension  Hyperlipidemia  Anemia  Diverticulosis  Discharged Condition: fair  Initial History: This is a 78y/o female, with known history of diverticulosis. She had bright red blood per rectum over last few days. She recently had a left knee replacements and was on Coumadin for DVT prophylaxis. Her last colonoscopy was approximately 3 years ago by Dr. Loreta Ave, she has a history of diverticulosis. She reported some left lower quadrant pain but this is chronic. She had some nausea but no vomiting or diarrhea. She was transferred to the ER. In the ER she was grossly guiac positive.  Hospital Course:   1. Rectal Bleeding/Lower Gi Bleed: This was thought to be a diverticular bleed. Patient was seen by gastroenterology. May have been exacerbated by the fact, that the patient was on Coumadin. Her INR was 3.07. Because of active bleeding she was given FFP's and vitamin K and her INR did come down. Over the last 24-36 hours patient has not had any rectal bleeding. She did have a few episodes of loose stool yesterday, which has been controlled with Imodium. She will need to followup with her gastroenterologist as an outpatient.   2. Acute Blood Loss Anemia: This was due to GI bleeding. Patient did not require any blood transfusion. Her hemoglobin was 8.6 at the time of admission, and it remained stable.   3. Left leg pain: She is recently status post knee replacement. There was no overt swelling. However, patient was complaining of calf pain. So, we obtained a venous Doppler, which did not show any deep venous thrombosis. So, the pain is thought to be postoperative period   4. Frequent urination with left flank pain: Urine analysis revealed abnormal, UA. There is a possibility  of infection. Since the patient is having frequent urination we will go ahead and treat it.   5. DVT Prophylaxis/Anticoagluation: Was on warfarin after knee replacement for DVt prophylaxis. Due to the rectal bleeding the INR had to be reversed using FFP's and vitamin K. She will no longer be able to utilize Warfarin. At this time I recommend TED stockings at home. I have told the patient to remain as active as possible.    6. home health physical therapy will be arranged for this patient along with occupational therapy. An R.N. will also be asked to evaluate this patient at home. Due to her recent hospitalization.  She did come to the hospital from her rehabilitation facility. However, she has been discharged from this facility because she has shown significant improvement. Have discussed this issue at length with the patient's daughter and she will be taking her home today.  PERTINENT LABS  Hemoglobin remained stable between 8 and 9.  IMAGING STUDIES Dg Chest 2 View  07/06/2011  *RADIOLOGY REPORT*  Clinical Data: Osteoarthritis of the left knee.  CHEST - 2 VIEW  Comparison: Chest radiograph 01/07/2011 and 01/05/2011  Findings: Heart size is borderline enlarged.  There is a stable tortuous thoracic aorta contour.  There is attenuation of the lung markings at the lung apices, and slight flattening of the hemidiaphragms.  These findings raise a question of mild emphysematous changes.  No airspace disease, effusion, or pneumothorax.  The bones are osteopenic.  There are degenerative changes of the thoracic spine.  No acute bony abnormalities identified.  IMPRESSION:  1.  Cannot exclude mild emphysema. 2.  No acute findings.  Original Report Authenticated By: Britta Mccreedy, M.D.   Dg Knee Left Port  07/13/2011  *RADIOLOGY REPORT*  Clinical Data:  left knee replacement  PORTABLE LEFT KNEE - 1-2 VIEW  Comparison: None.  Findings: Two portable views of the left knee submitted.  There is left knee  prosthesis anatomic alignment.  Postsurgical changes are noted.  Midline skin staples. Two surgical drains.  Periarticular soft tissue air.  A metallic screw noted in the left patella.  IMPRESSION: Left knee prosthesis in anatomic alignment.  Postsurgical changes are noted.  Original Report Authenticated By: Natasha Mead, M.D.    Discharge Exam: Blood pressure 136/64, pulse 73, temperature 98 F (36.7 C), temperature source Oral, resp. rate 18, height 5\' 5"  (1.651 m), weight 65.8 kg (145 lb 1 oz), SpO2 99.00%. General appearance: alert, cooperative, appears stated age and no distress Head: Normocephalic, without obvious abnormality, atraumatic Resp: clear to auscultation bilaterally Cardio: regular rate and rhythm, S1, S2 normal, no murmur, click, rub or gallop GI: soft, non-tender; bowel sounds normal; no masses,  no organomegaly Pulses: 2+ and symmetric Skin: Skin color, texture, turgor normal. No rashes or lesions  Disposition: Home  Discharge Orders    Future Orders Please Complete By Expires   Compression stockings      Diet - low sodium heart healthy      Increase activity slowly      Driving Restrictions      Comments:   No driving   Lifting restrictions      Comments:   No heavy lifting   Walk with assistance      Walker       May shower / Bathe          Follow-up Information    Follow up with HUNG,PATRICK D, MD. Make an appointment in 2 weeks. (post hospitalization follow up regarding rectal bleeding)    Contact information:   [address], [address] [place] [address] [phone]       Follow up with [facility]. Make an appointment in 1 week. (follow up for surgery and staple removal)    Contact information:   [facility] [address] [place] [address] [phone]         Current Discharge Medication List    START taking these medications   Details  ciprofloxacin (CIPRO) 250 MG tablet  Take 1 tablet (250 mg total) by mouth 2 (two) times daily. Qty: 14 tablet, Refills: 0      CONTINUE these medications which have CHANGED   Details  HYDROcodone-acetaminophen (NORCO) 5-325 MG per tablet Take 1-2 tablets by mouth every 4 (four) hours as needed for pain. For pain. Qty: 30 tablet, Refills: 0    metaxalone (SKELAXIN) 800 MG tablet Take 1 tablet (800 mg total) by mouth 3 (three) times daily. Qty: 30 tablet, Refills: 0      CONTINUE these medications which have NOT CHANGED   Details  ferrous sulfate 325 (65 FE) MG tablet Take 325 mg by mouth daily.      metFORMIN (GLUCOPHAGE) 500 MG tablet Take 500 mg by mouth 2 (two) times daily with a meal.      niacin 500 MG tablet Take 500 mg by mouth at bedtime.      simvastatin (ZOCOR) 20 MG tablet Take 20 mg by mouth at bedtime.        STOP taking these medications  celecoxib (CELEBREX) 200 MG capsule      warfarin (COUMADIN) 3 MG tablet        Total Discharge Time: 40 mins  [facility]  [facility] Pager [phone]  08/02/2011, 10:17 AM

## 2011-08-02 NOTE — Progress Notes (Signed)
PT Cancellation Note  Treatment cancelled today due to in process of discharge.  Spoke with daughter who reports will be going home with her and has HHPT set up as well as walker and cane.  Visualized patient ambulating in room with daughter assist and encouraged full time walker use at home.  Will defer evaluation to HHPT.  WYNN,CYNDI 08/02/2011, 11:07 AM

## 2011-08-02 NOTE — Progress Notes (Signed)
CM spoke with pt with daughter at bedside concerning d/c planning. Per pt request [facility] services instead of returning to [facility] upon d/c. Per pt choice [facility] to provide [facility] services. Pt request shower stool. [facility] to deliver DME to residence today 08/02/11. Family to provide 24 hour care.  Leonie Green [phone]

## 2011-08-04 LAB — GLUCOSE, CAPILLARY
Glucose-Capillary: 85 mg/dL (ref 70–99)
Glucose-Capillary: 104 mg/dL — ABNORMAL HIGH (ref 70–99)

## 2012-04-20 ENCOUNTER — Other Ambulatory Visit (HOSPITAL_COMMUNITY): Payer: Self-pay | Admitting: Family Medicine

## 2012-04-20 DIAGNOSIS — Z1231 Encounter for screening mammogram for malignant neoplasm of breast: Secondary | ICD-10-CM

## 2012-05-20 ENCOUNTER — Ambulatory Visit (HOSPITAL_COMMUNITY): Payer: Medicare Other

## 2012-05-30 ENCOUNTER — Ambulatory Visit (HOSPITAL_COMMUNITY)
Admission: RE | Admit: 2012-05-30 | Discharge: 2012-05-30 | Disposition: A | Discharge status: Home / Self Care | Payer: Medicare Other | Source: Ambulatory Visit | Attending: Family Medicine | Admitting: Family Medicine

## 2012-05-30 DIAGNOSIS — Z1231 Encounter for screening mammogram for malignant neoplasm of breast: Secondary | ICD-10-CM | POA: Insufficient documentation

## 2012-12-30 ENCOUNTER — Other Ambulatory Visit: Payer: Self-pay

## 2012-12-30 DIAGNOSIS — K921 Melena: Secondary | ICD-10-CM

## 2012-12-30 DIAGNOSIS — R109 Unspecified abdominal pain: Secondary | ICD-10-CM

## 2013-01-04 ENCOUNTER — Ambulatory Visit
Admission: RE | Admit: 2013-01-04 | Discharge: 2013-01-04 | Disposition: A | Discharge status: Home / Self Care | Payer: Medicare Other | Source: Ambulatory Visit

## 2013-01-04 DIAGNOSIS — R109 Unspecified abdominal pain: Secondary | ICD-10-CM

## 2013-01-04 DIAGNOSIS — K921 Melena: Secondary | ICD-10-CM

## 2013-01-04 MED ORDER — IOHEXOL 300 MG/ML  SOLN
100.0000 mL | Freq: Once | INTRAMUSCULAR | Status: AC | PRN
  Administered 2013-01-04: 100 mL via INTRAVENOUS

## 2013-01-08 IMAGING — CT CT ABD-PELV W/ CM
2 of 5 series · 15 of 46 positions shown, 17 images · IV contrast (agent unspecified)
Comparison: None.

CLINICAL DATA: Left lower quadrant abdominal pain over the past [AGE].  Low grade fever.  Diarrhea.  Generalized weakness and
lethargy.

CT ABDOMEN AND PELVIS WITH CONTRAST early 01/06/2011:
TECHNIQUE: Multidetector CT imaging of the abdomen and pelvis was
performed following the standard protocol during bolus
administration of intravenous contrast.
Contrast: 100 ml Tmnipaque-D44 IV.  Oral contrast also
administered.

[Series 2: rtn ap with st · axial · 0.70mm/px · z∈[-389,-9]mm · 12 of 86 slices shown, 14 images]
[im 5/86  soft-tissue]
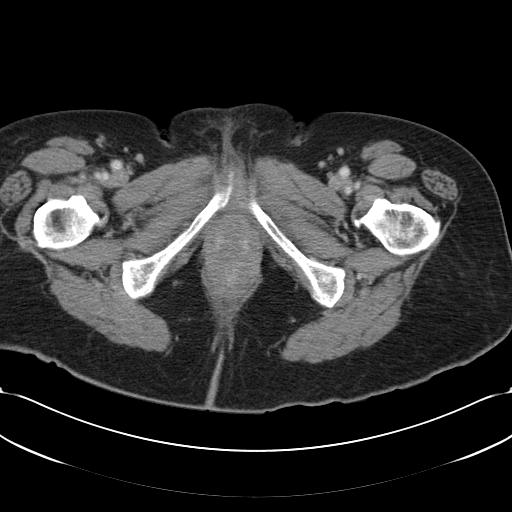
[im 5/86  bone]
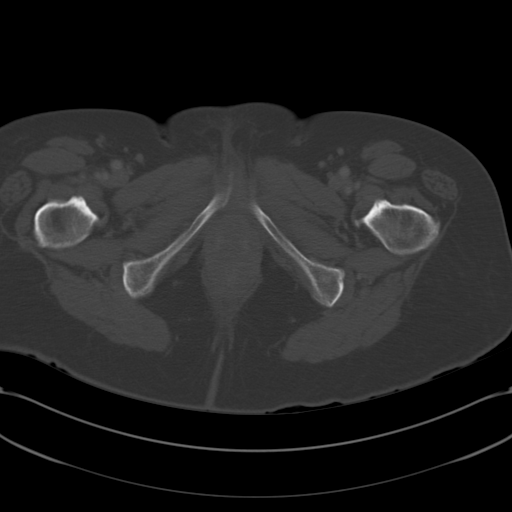
[im 14/86  soft-tissue]
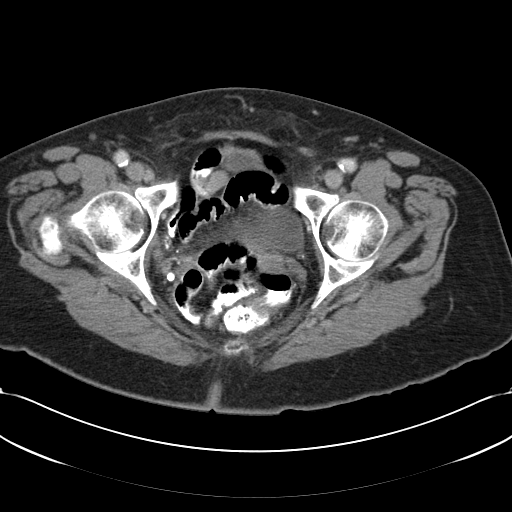
[im 18/86  soft-tissue]
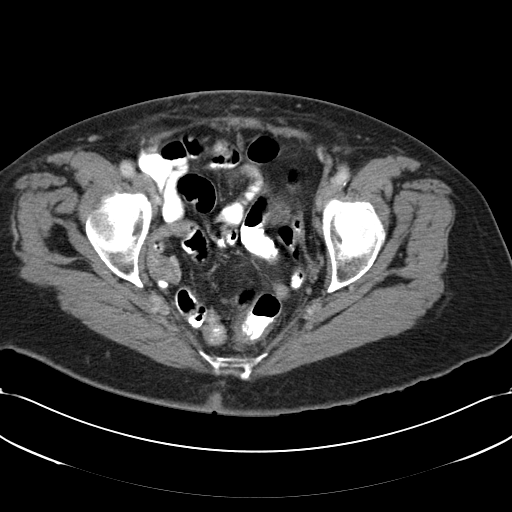
[im 27/86  soft-tissue]
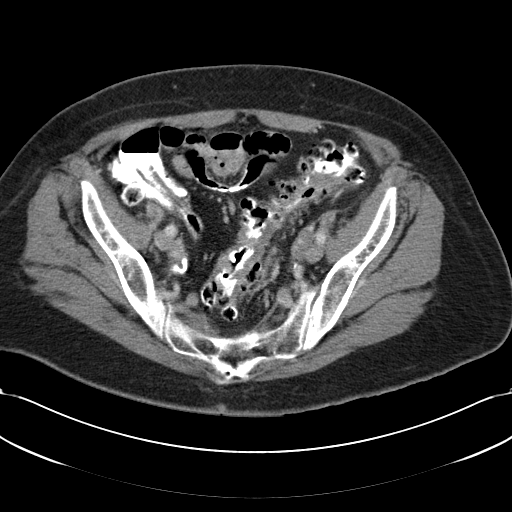
[im 32/86  soft-tissue]
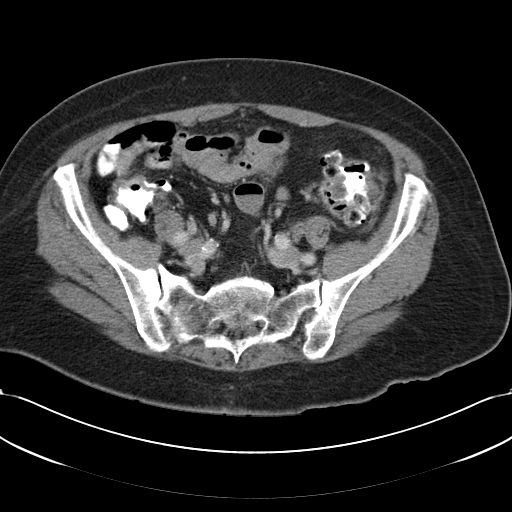
[im 41/86  soft-tissue]
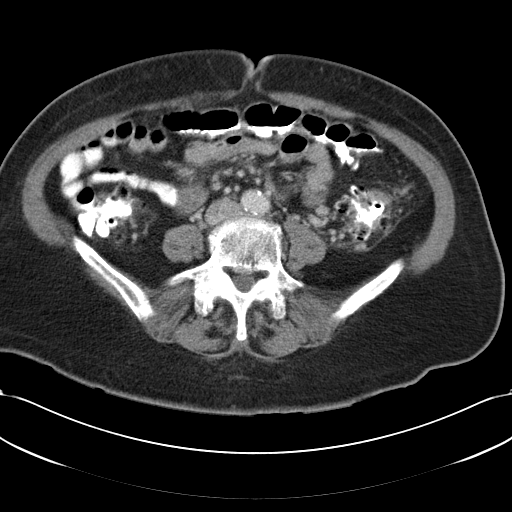
[im 45/86  soft-tissue]
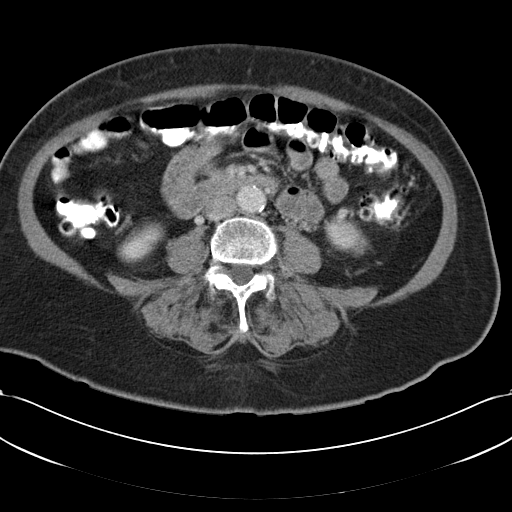
[im 54/86  soft-tissue]
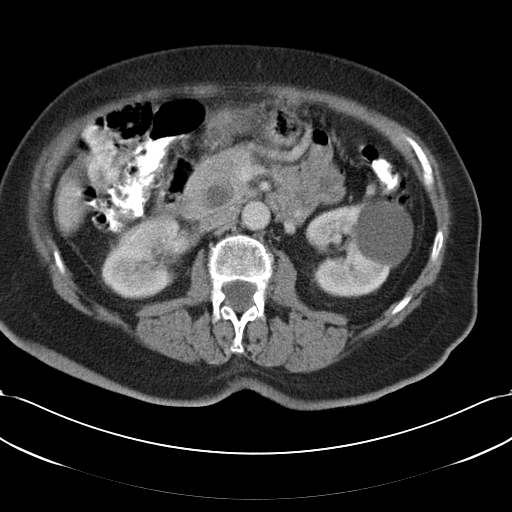
[im 59/86  soft-tissue]
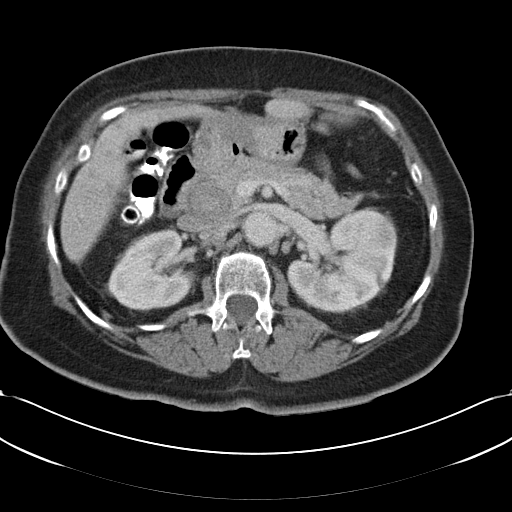
[im 59/86  bone]
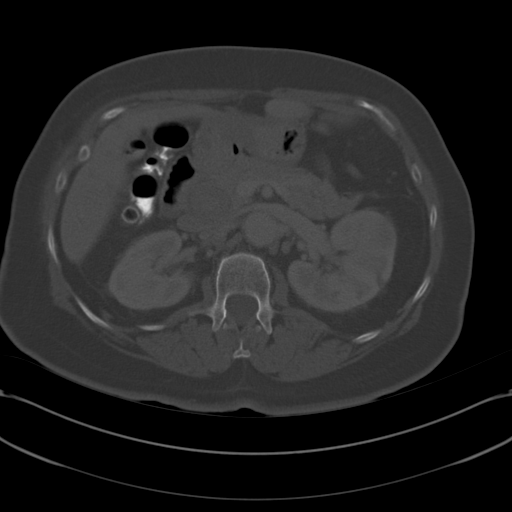
[im 68/86  soft-tissue]
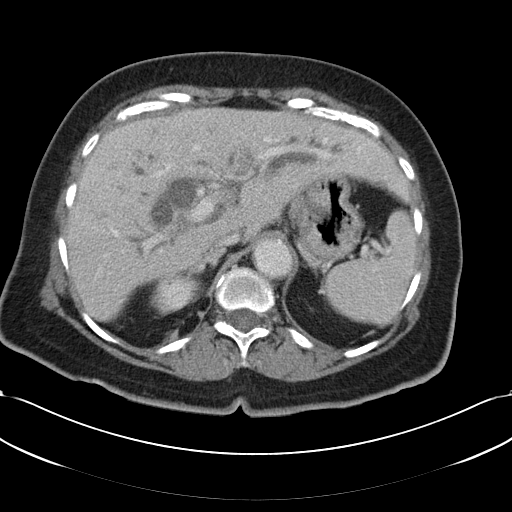
[im 72/86  soft-tissue]
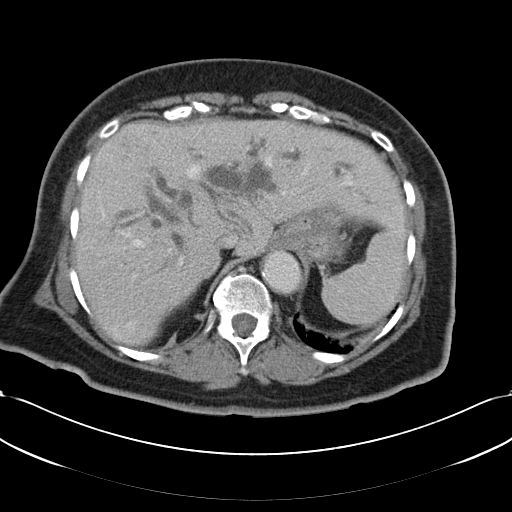
[im 81/86  soft-tissue]
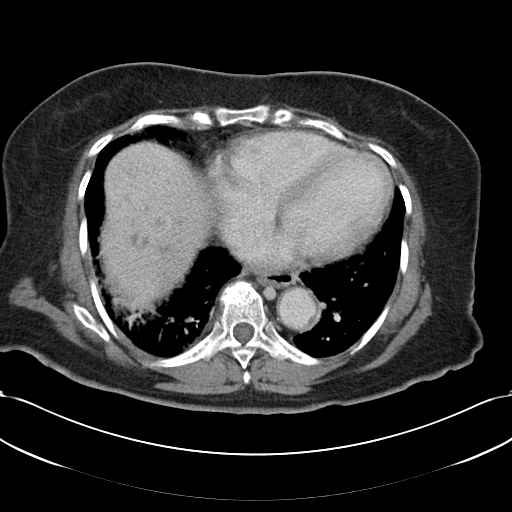

[Series 602: coronal · coronal · 0.87mm/px · 3 of 77 slices shown]
[im 26/77  soft-tissue]
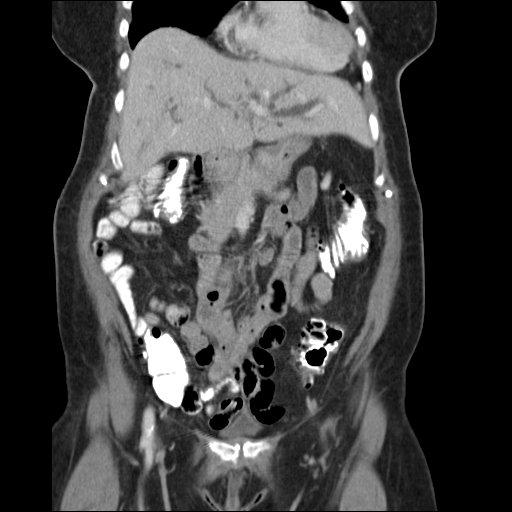
[im 34/77  soft-tissue]
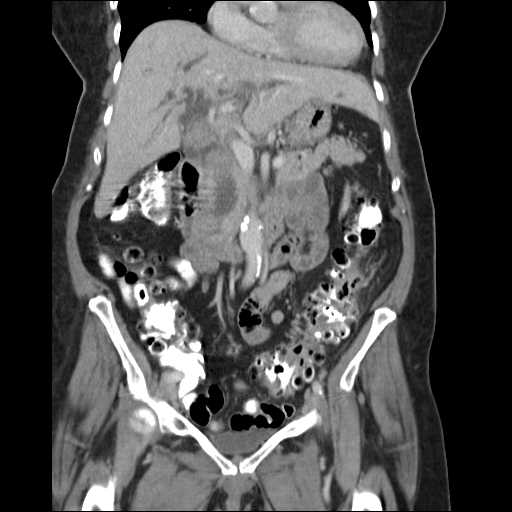
[im 43/77  soft-tissue]
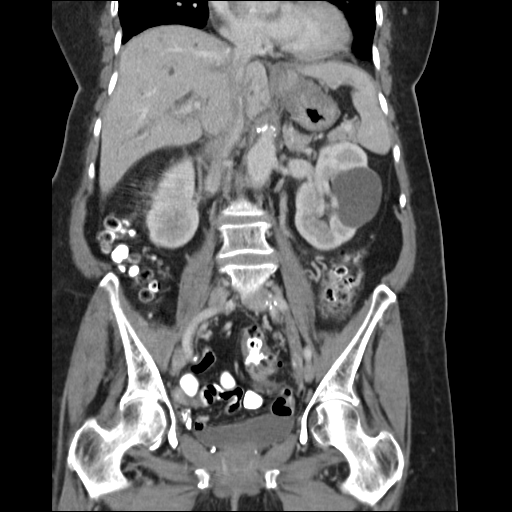

[15 of 46 positions shown; findings below may reference images not displayed]

FINDINGS: Diffuse colonic diverticulosis, extensive involving the
descending and sigmoid colon.  Mild pericolonic edema/inflammation
at the junction of the descending and sigmoid colon.  No
extraluminal gas.  No abnormal fluid collections.  Marked wall
thickening involving the entire sigmoid colon.  Small bowel normal
in appearance.  Small hiatal hernia; stomach otherwise unremarkable
by CT.  No biliary ductal dilation.  Normal appearing appendix in
the right lower pelvis, filled with oral contrast.  No ascites.

Marked intra and extrahepatic biliary ductal dilation, with 3
filling defects in the extrahepatic bile duct, the stone in the
distal duct measures approximately 2.7 cm, the stent in the
proximal common bile duct measures approximately 3.2 cm, and the
stone in the common hepatic duct just below the bifurcation
measures approximately 3.3 cm.  No visible intrahepatic bile duct
stones.  No focal hepatic parenchymal abnormalities apart from a
probable GIANNI (transit hepatic attenuation difference) in the
posterior segment right lobe (image 14).  Anatomic variant in that
the left lobe extends well across the midline into the left upper
quadrant.

Normal appearing spleen, pancreas, and adrenal glands.  Numerous
bilateral renal cortical cysts, the largest arising from the left
mid kidney approximating 4.8 cm; no significant abnormalities
involving either kidney.  Extensive aorto-iliofemoral
atherosclerosis without aneurysm.  Visceral arteries patent.  No
significant lymphadenopathy.

Urinary bladder decompressed and unremarkable.  Small left inguinal
hernia containing fat.  Uterus surgically absent.  Normal-appearing
ovaries by CT.  No adnexal masses or free pelvic fluid.  Numerous
pelvic phleboliths.  Bone window images demonstrate lower thoracic
spondylosis, degenerative changes throughout the lumbar spine, and
degenerative changes in the sacroiliac joints.  Expected dependent
atelectasis posteriorly in the lower lobes, with probable mild
interstitial lung disease and peripheral blebs involving the right
lower lobe.
IMPRESSION: 1.  Mild acute diverticulitis at the junction of the descending and
sigmoid colon.  No evidence of abscess or microperforation.
2.  3 large gallstones within the extrahepatic bile duct, with
marked intra and extrahepatic biliary ductal dilation.  The stones
are all on the order of approximately 3 cm diameter.
3. Marked wall thickening involving the entire sigmoid colon,
likely muscular hyperplasia related to chronic diverticular
disease.
4.  Small hiatal hernia.
5.  Small left inguinal hernia containing fat.
6.  Emphysematous changes involving the right lung base.  Possible
interstitial lung disease.

## 2013-03-15 ENCOUNTER — Ambulatory Visit
Admission: RE | Admit: 2013-03-15 | Discharge: 2013-03-15 | Disposition: A | Discharge status: Home / Self Care | Payer: Medicare Other | Source: Ambulatory Visit | Attending: Family Medicine | Admitting: Family Medicine

## 2013-03-15 ENCOUNTER — Other Ambulatory Visit: Payer: Self-pay | Admitting: Family Medicine

## 2013-03-15 DIAGNOSIS — M545 Low back pain: Secondary | ICD-10-CM

## 2013-06-01 ENCOUNTER — Inpatient Hospital Stay (HOSPITAL_COMMUNITY)
Admission: EM | Admit: 2013-06-01 | Discharge: 2013-06-03 | DRG: 379 | Disposition: A | Discharge status: Home / Self Care | Payer: Medicare Other | Attending: Internal Medicine | Admitting: Internal Medicine

## 2013-06-01 ENCOUNTER — Encounter (HOSPITAL_COMMUNITY): Payer: Self-pay | Admitting: Emergency Medicine

## 2013-06-01 ENCOUNTER — Emergency Department (HOSPITAL_COMMUNITY): Payer: Medicare Other

## 2013-06-01 DIAGNOSIS — K5790 Diverticulosis of intestine, part unspecified, without perforation or abscess without bleeding: Secondary | ICD-10-CM

## 2013-06-01 DIAGNOSIS — K921 Melena: Secondary | ICD-10-CM | POA: Diagnosis present

## 2013-06-01 DIAGNOSIS — Z79899 Other long term (current) drug therapy: Secondary | ICD-10-CM

## 2013-06-01 DIAGNOSIS — Z87891 Personal history of nicotine dependence: Secondary | ICD-10-CM

## 2013-06-01 DIAGNOSIS — M129 Arthropathy, unspecified: Secondary | ICD-10-CM | POA: Diagnosis present

## 2013-06-01 DIAGNOSIS — Z87440 Personal history of urinary (tract) infections: Secondary | ICD-10-CM

## 2013-06-01 DIAGNOSIS — K5733 Diverticulitis of large intestine without perforation or abscess with bleeding: Principal | ICD-10-CM | POA: Diagnosis present

## 2013-06-01 DIAGNOSIS — E785 Hyperlipidemia, unspecified: Secondary | ICD-10-CM

## 2013-06-01 DIAGNOSIS — K5732 Diverticulitis of large intestine without perforation or abscess without bleeding: Secondary | ICD-10-CM

## 2013-06-01 DIAGNOSIS — Z96659 Presence of unspecified artificial knee joint: Secondary | ICD-10-CM

## 2013-06-01 DIAGNOSIS — M1712 Unilateral primary osteoarthritis, left knee: Secondary | ICD-10-CM

## 2013-06-01 DIAGNOSIS — D649 Anemia, unspecified: Secondary | ICD-10-CM

## 2013-06-01 DIAGNOSIS — I10 Essential (primary) hypertension: Secondary | ICD-10-CM

## 2013-06-01 DIAGNOSIS — K922 Gastrointestinal hemorrhage, unspecified: Secondary | ICD-10-CM

## 2013-06-01 DIAGNOSIS — K5792 Diverticulitis of intestine, part unspecified, without perforation or abscess without bleeding: Secondary | ICD-10-CM | POA: Diagnosis present

## 2013-06-01 LAB — COMPREHENSIVE METABOLIC PANEL
Albumin: 3.5 g/dL (ref 3.5–5.2)
Alkaline Phosphatase: 61 U/L (ref 39–117)
BUN: 21 mg/dL (ref 6–23)
Chloride: 106 mEq/L (ref 96–112)
Creatinine, Ser: 0.96 mg/dL (ref 0.50–1.10)
GFR calc Af Amer: 61 mL/min — ABNORMAL LOW (ref >90)
Glucose, Bld: 79 mg/dL (ref 70–99)
Potassium: 4.5 mEq/L (ref 3.5–5.1)
Total Bilirubin: 0.3 mg/dL (ref 0.3–1.2)

## 2013-06-01 LAB — LIPASE, BLOOD: Lipase: 54 U/L (ref 11–59)

## 2013-06-01 LAB — URINALYSIS, ROUTINE W REFLEX MICROSCOPIC
Bilirubin Urine: NEGATIVE
Glucose, UA: NEGATIVE mg/dL
Hgb urine dipstick: NEGATIVE
Ketones, ur: NEGATIVE mg/dL
Protein, ur: NEGATIVE mg/dL
pH: 7 (ref 5.0–8.0)

## 2013-06-01 LAB — CBC WITH DIFFERENTIAL/PLATELET
Basophils Relative: 0 % (ref 0–1)
Eosinophils Absolute: 0.2 10*3/uL (ref 0.0–0.7)
HCT: 38 % (ref 36.0–46.0)
Hemoglobin: 12.8 g/dL (ref 12.0–15.0)
Lymphs Abs: 1.7 10*3/uL (ref 0.7–4.0)
MCH: 29.5 pg (ref 26.0–34.0)
MCHC: 33.7 g/dL (ref 30.0–36.0)
Monocytes Absolute: 0.7 10*3/uL (ref 0.1–1.0)
Monocytes Relative: 10 % (ref 3–12)
Neutro Abs: 5 10*3/uL (ref 1.7–7.7)
Neutrophils Relative %: 66 % (ref 43–77)
RBC: 4.34 MIL/uL (ref 3.87–5.11)

## 2013-06-01 LAB — CBC
HCT: 37.7 % (ref 36.0–46.0)
Hemoglobin: 12.7 g/dL (ref 12.0–15.0)
MCH: 29.6 pg (ref 26.0–34.0)
MCV: 87.9 fL (ref 78.0–100.0)
RBC: 4.29 MIL/uL (ref 3.87–5.11)

## 2013-06-01 LAB — PROTIME-INR: Prothrombin Time: 13.5 seconds (ref 11.6–15.2)

## 2013-06-01 MED ORDER — IOHEXOL 300 MG/ML  SOLN
100.0000 mL | Freq: Once | INTRAMUSCULAR | Status: AC | PRN
  Administered 2013-06-01: 100 mL via INTRAVENOUS

## 2013-06-01 MED ORDER — CIPROFLOXACIN IN D5W 400 MG/200ML IV SOLN
400.0000 mg | Freq: Two times a day (BID) | INTRAVENOUS | Status: DC
  Administered 2013-06-02 – 2013-06-03 (×3): 400 mg via INTRAVENOUS
  Filled 2013-06-01 (×4): qty 200

## 2013-06-01 MED ORDER — SODIUM CHLORIDE 0.9 % IV SOLN
INTRAVENOUS | Status: DC
  Administered 2013-06-01: 21:00:00 via INTRAVENOUS

## 2013-06-01 MED ORDER — ONDANSETRON HCL 4 MG PO TABS: 4.0000 mg | ORAL_TABLET | Freq: Four times a day (QID) | ORAL | Status: DC | PRN

## 2013-06-01 MED ORDER — MORPHINE SULFATE 2 MG/ML IJ SOLN: 1.0000 mg | INTRAMUSCULAR | Status: DC | PRN

## 2013-06-01 MED ORDER — METRONIDAZOLE IN NACL 5-0.79 MG/ML-% IV SOLN
500.0000 mg | Freq: Once | INTRAVENOUS | Status: AC
  Administered 2013-06-01: 500 mg via INTRAVENOUS
  Filled 2013-06-01: qty 100

## 2013-06-01 MED ORDER — ACETAMINOPHEN 325 MG PO TABS: 650.0000 mg | ORAL_TABLET | Freq: Four times a day (QID) | ORAL | Status: DC | PRN

## 2013-06-01 MED ORDER — CIPROFLOXACIN IN D5W 400 MG/200ML IV SOLN
400.0000 mg | Freq: Once | INTRAVENOUS | Status: AC
  Administered 2013-06-01: 400 mg via INTRAVENOUS
  Filled 2013-06-01: qty 200

## 2013-06-01 MED ORDER — ACETAMINOPHEN 650 MG RE SUPP: 650.0000 mg | Freq: Four times a day (QID) | RECTAL | Status: DC | PRN

## 2013-06-01 MED ORDER — HYDROMORPHONE HCL PF 1 MG/ML IJ SOLN: 0.5000 mg | Freq: Once | INTRAMUSCULAR | Status: DC

## 2013-06-01 MED ORDER — IOHEXOL 300 MG/ML  SOLN
25.0000 mL | INTRAMUSCULAR | Status: AC
  Administered 2013-06-01: 25 mL via ORAL

## 2013-06-01 MED ORDER — ONDANSETRON HCL 4 MG/2ML IJ SOLN: 4.0000 mg | Freq: Four times a day (QID) | INTRAMUSCULAR | Status: DC | PRN

## 2013-06-01 MED ORDER — METRONIDAZOLE IN NACL 5-0.79 MG/ML-% IV SOLN
500.0000 mg | Freq: Three times a day (TID) | INTRAVENOUS | Status: DC
  Administered 2013-06-02 – 2013-06-03 (×6): 500 mg via INTRAVENOUS
  Filled 2013-06-01 (×7): qty 100

## 2013-06-01 NOTE — Progress Notes (Signed)
ANTIBIOTIC CONSULT NOTE - INITIAL  Pharmacy Consult for Ciprofloxacin Indication: Diverticulitis  Allergies  Allergen Reactions  . Amitriptyline   . Gabapentin   . Meloxicam   . Tramadol     Patient Measurements: Height: 5' 5.5" (166.4 cm) Weight: 154 lb 12.2 oz (70.2 kg) IBW/kg (Calculated) : 58.15   Vital Signs: Temp: 98.3 F (36.8 C) (11/06 2054) Temp src: Oral (11/06 2054) BP: 162/68 mmHg (11/06 2054) Pulse Rate: 73 (11/06 2054) Intake/Output from previous day:   Intake/Output from this shift:    Labs:  Recent Labs  06/01/13 1517  WBC 7.7  HGB 12.8  PLT 329  CREATININE 0.96   Estimated Creatinine Clearance: 42.6 ml/min (by C-G formula based on Cr of 0.96). No results found for this basename: VANCOTROUGH, VANCOPEAK, VANCORANDOM, GENTTROUGH, GENTPEAK, GENTRANDOM, TOBRATROUGH, TOBRAPEAK, TOBRARND, AMIKACINPEAK, AMIKACINTROU, AMIKACIN,  in the last 72 hours   Microbiology: No results found for this or any previous visit (from the past 720 hour(s)).  Medical History: Past Medical History  Diagnosis Date  . HTN (hypertension)   . Dyslipidemia   . Diverticulosis   . Arthritis   . Diverticulitis   . PONV (postoperative nausea and vomiting)     N&V when awakening from anesthesia  . Dysrhythmia     irregular heart beat  . Shortness of breath     saw cardiologist for this  . UTI (urinary tract infection)     history of UTI  . Jaundice, obstructive, intrahepatic     s/p ERCP, lithotripsy, and stone removal 01/2011, 02/2011    Medications:  Prescriptions prior to admission  Medication Sig Dispense Refill  . alendronate (FOSAMAX) 70 MG tablet Take 70 mg by mouth once a week. Take with a full glass of water on an empty stomach.      . calcium-vitamin D (OSCAL WITH D) 500-200 MG-UNIT per tablet Take 1 tablet by mouth daily with breakfast.      . celecoxib (CELEBREX) 200 MG capsule Take 200 mg by mouth daily as needed for mild pain.      . ciprofloxacin  (CIPRO) 500 MG tablet Take 500 mg by mouth 2 (two) times daily.      Marland Kitchen glucosamine-chondroitin 500-400 MG tablet Take 1 tablet by mouth 3 (three) times daily.      Marland Kitchen HYDROcodone-acetaminophen (NORCO/VICODIN) 5-325 MG per tablet Take 1 tablet by mouth every 6 (six) hours as needed for moderate pain.      . metroNIDAZOLE (FLAGYL) 500 MG tablet Take 500 mg by mouth 3 (three) times daily.      . Multiple Vitamins-Minerals (MULTIVITAMIN PO) Take 1 tablet by mouth daily.      . polyethylene glycol (MIRALAX / GLYCOLAX) packet Take 17 g by mouth daily as needed for mild constipation.       Scheduled:  . ciprofloxacin  400 mg Intravenous Once  . [START ON 06/02/2013] ciprofloxacin  400 mg Intravenous Q12H  . [START ON 06/02/2013] metronidazole  500 mg Intravenous Q8H   Assessment: 79 y.o female with a PMH of diverticulitis who presents to the ER with complaints of hematochezia and LLQ abd pain. CT scan revealed a distal descending colon/proximal sigmoid colon diverticulitis. Flagyl started and pharmacy consulted for ciprofloxacin dosing.  SCr 0.96, estimated CrCl ~ 42.6 ml/min.     Plan:  Cipro 400 mg IV q12h.  Follow up renal function and clinical status daily.  Noah Delaine, RPh Clinical Pharmacist Pager: [phone] 06/01/2013,9:28 PM

## 2013-06-01 NOTE — H&P (Signed)
[facility] History and Physical  Kimberly Rasmussen ZOX:[id number] DOB: 1926/05/19 DOA: 06/01/2013  Referring physician: ER physician. PCP: Emeterio Reeve, MD  Specialists: Dr. Jeani Hawking. Gastroenterologist.  Chief Complaint: Abdominal pain and rectal bleed.  HPI: Kimberly Rasmussen is a 79 y.o. female with known history of previous diverticular bleed presented to the ER because of rectal bleeding. Patient has been having left lower quadrant pain for last 2 months which has recently worsened and patient had gone to her PCP 3 days ago and was prescribed Cipro and Flagyl. Last 2 days patient noticed rectal bleeding and patient was referred to the ER. Patient has been having 2-3 bowel movements with frankly bloody bowel movements last 2 days. Patient abdominal pain is not related to diet or bowel movements. In the ER patient had CT abdomen and pelvis which shows features concerning for diverticulitis and also patient has multiple diverticula. Patient has been admitted for rectal bleeding most likely from diverticulosis with patient having diverticulitis. Patient denies any chest pain shortness of breath nausea vomiting fever chills. Patient has been already seen by gastroenterologist in the ER.  Review of Systems: As presented in the history of presenting illness, rest negative.  Past Medical History  Diagnosis Date  . HTN (hypertension)   . Dyslipidemia   . Diverticulosis   . Arthritis   . Diverticulitis   . PONV (postoperative nausea and vomiting)     N&V when awakening from anesthesia  . Dysrhythmia     irregular heart beat  . Shortness of breath     saw cardiologist for this  . UTI (urinary tract infection)     history of UTI  . Jaundice, obstructive, intrahepatic     s/p ERCP, lithotripsy, and stone removal 01/2011, 02/2011   Past Surgical History  Procedure Laterality Date  . Total knee arthroplasty      RIGHT KNEE  . Knee surgery      LEFT KNEE  . Total abdominal  hysterectomy    . Cholecystectomy      removal of gallstones x4 (?ERCP)  . Foot surgery      x2  . Ercp      with lithotripsy and gall stone removal 2012  . Total knee arthroplasty  07/13/2011    Procedure: TOTAL KNEE ARTHROPLASTY;  Surgeon: Nestor Lewandowsky;  Location: [facility] OR;  Service: Orthopedics;  Laterality: Left;  Left Total Knee Arthroplasty with Hardware Removal    Social History:  reports that she quit smoking about 16 years ago. Her smoking use included Cigarettes. She has a 20 pack-year smoking history. She has never used smokeless tobacco. She reports that she drinks about 0.6 ounces of alcohol per week. She reports that she does not use illicit drugs. Where does patient live home. Can patient participate in ADLs? Yes.  Allergies  Allergen Reactions  . Amitriptyline   . Gabapentin   . Meloxicam   . Tramadol     Family History:  Family History  Problem Relation Age of Onset  . Pneumonia Mother   . Lung cancer Sister   . Hypertension Maternal Grandmother   . Stroke Maternal Grandmother       Prior to Admission medications   Medication Sig Start Date End Date Taking? Authorizing Provider  alendronate (FOSAMAX) 70 MG tablet Take 70 mg by mouth once a week. Take with a full glass of water on an empty stomach.   Yes Historical Provider, MD  calcium-vitamin D (OSCAL WITH D) 500-200 MG-UNIT per tablet  Take 1 tablet by mouth daily with breakfast.   Yes Historical Provider, MD  celecoxib (CELEBREX) 200 MG capsule Take 200 mg by mouth daily as needed for mild pain.   Yes Historical Provider, MD  ciprofloxacin (CIPRO) 500 MG tablet Take 500 mg by mouth 2 (two) times daily.   Yes Historical Provider, MD  glucosamine-chondroitin 500-400 MG tablet Take 1 tablet by mouth 3 (three) times daily.   Yes Historical Provider, MD  HYDROcodone-acetaminophen (NORCO/VICODIN) 5-325 MG per tablet Take 1 tablet by mouth every 6 (six) hours as needed for moderate pain.   Yes Historical Provider, MD   metroNIDAZOLE (FLAGYL) 500 MG tablet Take 500 mg by mouth 3 (three) times daily.   Yes Historical Provider, MD  Multiple Vitamins-Minerals (MULTIVITAMIN PO) Take 1 tablet by mouth daily.   Yes Historical Provider, MD  polyethylene glycol (MIRALAX / GLYCOLAX) packet Take 17 g by mouth daily as needed for mild constipation.   Yes Historical Provider, MD    Physical Exam: Filed Vitals:   06/01/13 1513 06/01/13 2029  BP: 118/62 150/78  Pulse: 60 68  Temp: 98.1 F (36.7 C)   TempSrc: Oral   Resp:  18  SpO2: 96% 97%     General:  Well-developed well-nourished.  Eyes: Anicteric no pallor.  ENT: No discharge from ears eyes nose mouth.  Neck: No mass felt.  Cardiovascular: S1-S2 heard.  Respiratory: No rhonchi or crepitations.  Abdomen: Soft mild tenderness in the left upper and lower quadrants. No guarding or rigidity.  Skin: No rash.  Musculoskeletal: No edema.  Psychiatric: Appears normal.  Neurologic: Alert awake oriented to time place and person. Moves all extremities.  Labs on Admission:  Basic Metabolic Panel:  Recent Labs Lab 06/01/13 1517  NA 141  K 4.5  CL 106  CO2 23  GLUCOSE 79  BUN 21  CREATININE 0.96  CALCIUM 9.4   Liver Function Tests:  Recent Labs Lab 06/01/13 1517  AST 23  ALT 10  ALKPHOS 61  BILITOT 0.3  PROT 7.2  ALBUMIN 3.5    Recent Labs Lab 06/01/13 1517  LIPASE 54   No results found for this basename: AMMONIA,  in the last 168 hours CBC:  Recent Labs Lab 06/01/13 1517  WBC 7.7  NEUTROABS 5.0  HGB 12.8  HCT 38.0  MCV 87.6  PLT 329   Cardiac Enzymes: No results found for this basename: CKTOTAL, CKMB, CKMBINDEX, TROPONINI,  in the last 168 hours  BNP (last 3 results) No results found for this basename: PROBNP,  in the last 8760 hours CBG: No results found for this basename: GLUCAP,  in the last 168 hours  Radiological Exams on Admission: Ct Abdomen Pelvis W Contrast  06/01/2013   CLINICAL DATA:  Rectal  bleeding. History of sphincterotomy and biliary stent placement.  EXAM: CT ABDOMEN AND PELVIS WITH CONTRAST  TECHNIQUE: Multidetector CT imaging of the abdomen and pelvis was performed using the standard protocol following bolus administration of intravenous contrast.  CONTRAST:  OMNIPAQUE IOHEXOL 300 MG/ML  SOLN  COMPARISON:  CT abdomen and pelvis 01/04/2013 and 01/06/2011  FINDINGS: There are scattered areas of atelectasis at the lung bases. Mild subpleural cystic changes in the medial right lower lobe are stable. Negative for pleural effusion. There is a small hiatal hernia.  Pneumobilia is again noted in both lobes of the liver. This is unchanged in appearance compared to CT abdomen pelvis of June 2014 and consistent with history of prior sphincterotomy and bile  duct stent placement. No focal hepatic lesion is identified. The pancreas appears stable. Mild pancreatic ductal dilatation and air within the pancreatic duct are again seen.  Spleen is normal in size and enhancement. There is a stable 3.9 cm cyst extending from the lower pole of the left kidney. There is no hydronephrosis on the left. The right kidney and adrenal glands are within normal limits.  Stomach is nondistended and appears within normal limits.  Small bowel loops are normal in caliber and wall thickness. Previously described fecalization of the distal/terminal ileum has resolved. The terminal ileum appears normal. A definite appendix is not visualized.  There is a moderate amount of stool throughout the colon and there is extensive colonic diverticulosis, most prominent in the sigmoid region. There is pericolonic stranding an injection/prominence of the pericolonic vessels near the junction of the descending colon and proximal sigmoid colon. This finding can be seen in the setting of acute diverticulitis. The appearance is very similar to the CT of 01/04/2013 and is 01/06/2011. No extraluminal gas is seen. There is no evidence of abscess,  and there is no of free fluid in the abdomen or pelvis. Negative for pneumoperitoneum.  An 8 mm lymph node adjacent to the sigmoid colon on image number 50 to appears stable dating back to a CT of 01/06/2011.  Urinary bladder is unremarkable. Patient is status post hysterectomy. Negative for adnexal mass.  There is atherosclerotic calcification of the aorta, iliac vasculature, and proximal femoral vessels. Although there is some ectasia of the infrarenal abdominal aorta, there is no aneurysm.  Fat containing bilateral inguinal region hernia is noted, left greater than right. These are stable.  There is chronic grade 1 anterolisthesis of L5 on S1, likely related to facet joint degenerative changes. There are no pars defects. Minimal retrolisthesis of L3 on L4 is also stable. Minimal retrolisthesis of L2 on L3 is stable. The vertebral body heights are preserved. No suspicious osseous lesions.  IMPRESSION: 1. Extensive colonic diverticulosis. Focal stranding adjacent to the distal descending colon and proximal sigmoid colon could reflect acute diverticulitis. The pericolonic stranding in this region appears very similar to CT exams of June 2014 and June 2012. Findings could reflect recurrent acute diverticulitis, but the possibility of chronic stranding in this region is also considered.  2. Given the patient has extensive colonic diverticulosis, with associated colonic wall thickening, followup colonoscopy, when patient's acute illness resolved, should be considered to exclude malignancy.  3. Chronic pneumobilia and air within the pancreatic duct, in this patient with history of sphincterotomy and bile duct stent placement.  4. Atherosclerosis of the aorta and iliac vasculature.  5. Chronic bilateral inguinal region hernias containing fat only.  6. Stable degenerative changes with mild listhesis at several levels in the lumbar spine.   Electronically Signed   By: Britta Mccreedy M.D.   On: 06/01/2013 19:34     Assessment/Plan Active Problems:   GI bleed   Diverticulitis   1. Rectal bleeding - most likely origin is diverticulosis. Closely follow CBC. Type and screen. 2. Acute diverticulitis - patient has been placed on Cipro and Flagyl. Check stool for C. difficile.    Code Status: Full code.  Family Communication: None.  Disposition Plan: Admit to inpatient.    KAKRAKANDY,ARSHAD N. [facility] Pager [phone].  If 7PM-7AM, please contact night-coverage [email or web address] Password [facility] 06/01/2013, 8:36 PM

## 2013-06-01 NOTE — ED Notes (Signed)
pts belongings, including hearing aid sent home with daughters.

## 2013-06-01 NOTE — ED Notes (Signed)
Patient transported to CT 

## 2013-06-01 NOTE — Consult Note (Signed)
Reason for Consult: Hematochezia and LLQ ABM pain Referring Physician: [facility]  Pang Robers HPI: This is an 79 year old female with a PMH of diverticulitis who presents to the ER with complaints of hematochezia and LLQ abdominal pain.  The bleeding started this past Sunday and it is associated with abdominal pain.  No reports of any fever, but it is painful for her to ambulate.  With her prior history of diverticulitis the CT scan revealed a distal descending colon/proximal sigmoid colon diverticulitis.  She responded to treatment.  Past Medical History  Diagnosis Date  . HTN (hypertension)   . Dyslipidemia   . Diverticulosis   . Arthritis   . Diverticulitis   . PONV (postoperative nausea and vomiting)     N&V when awakening from anesthesia  . Dysrhythmia     irregular heart beat  . Shortness of breath     saw cardiologist for this  . UTI (urinary tract infection)     history of UTI  . Jaundice, obstructive, intrahepatic     s/p ERCP, lithotripsy, and stone removal 01/2011, 02/2011    Past Surgical History  Procedure Laterality Date  . Total knee arthroplasty      RIGHT KNEE  . Knee surgery      LEFT KNEE  . Total abdominal hysterectomy    . Cholecystectomy      removal of gallstones x4 (?ERCP)  . Foot surgery      x2  . Ercp      with lithotripsy and gall stone removal 2012  . Total knee arthroplasty  07/13/2011    Procedure: TOTAL KNEE ARTHROPLASTY;  Surgeon: Nestor Lewandowsky;  Location: [facility] OR;  Service: Orthopedics;  Laterality: Left;  Left Total Knee Arthroplasty with Hardware Removal     Family History  Problem Relation Age of Onset  . Pneumonia Mother   . Lung cancer Sister   . Hypertension Maternal Grandmother   . Stroke Maternal Grandmother     Social History:  reports that she quit smoking about 16 years ago. Her smoking use included Cigarettes. She has a 20 pack-year smoking history. She has never used smokeless tobacco. She reports that she  drinks about 0.6 ounces of alcohol per week. She reports that she does not use illicit drugs.  Allergies:  Allergies  Allergen Reactions  . Amitriptyline   . Gabapentin   . Meloxicam   . Tramadol     Medications: Scheduled: Continuous:  Results for orders placed during the hospital encounter of 06/01/13 (from the past 24 hour(s))  CBC WITH DIFFERENTIAL     Status: Abnormal   Collection Time    06/01/13  3:17 PM      Result Value Range   WBC 7.7  4.0 - 10.5 K/uL   RBC 4.34  3.87 - 5.11 MIL/uL   Hemoglobin 12.8  12.0 - 15.0 g/dL   HCT 16.1  09.6 - 04.5 %   MCV 87.6  78.0 - 100.0 fL   MCH 29.5  26.0 - 34.0 pg   MCHC 33.7  30.0 - 36.0 g/dL   RDW 40.9 (*) 81.1 - 91.4 %   Platelets 329  150 - 400 K/uL   Neutrophils Relative % 66  43 - 77 %   Neutro Abs 5.0  1.7 - 7.7 K/uL   Lymphocytes Relative 22  12 - 46 %   Lymphs Abs 1.7  0.7 - 4.0 K/uL   Monocytes Relative 10  3 - 12 %  Monocytes Absolute 0.7  0.1 - 1.0 K/uL   Eosinophils Relative 2  0 - 5 %   Eosinophils Absolute 0.2  0.0 - 0.7 K/uL   Basophils Relative 0  0 - 1 %   Basophils Absolute 0.0  0.0 - 0.1 K/uL  COMPREHENSIVE METABOLIC PANEL     Status: Abnormal   Collection Time    06/01/13  3:17 PM      Result Value Range   Sodium 141  135 - 145 mEq/L   Potassium 4.5  3.5 - 5.1 mEq/L   Chloride 106  96 - 112 mEq/L   CO2 23  19 - 32 mEq/L   Glucose, Bld 79  70 - 99 mg/dL   BUN 21  6 - 23 mg/dL   Creatinine, Ser 6.29  0.50 - 1.10 mg/dL   Calcium 9.4  8.4 - 52.8 mg/dL   Total Protein 7.2  6.0 - 8.3 g/dL   Albumin 3.5  3.5 - 5.2 g/dL   AST 23  0 - 37 U/L   ALT 10  0 - 35 U/L   Alkaline Phosphatase 61  39 - 117 U/L   Total Bilirubin 0.3  0.3 - 1.2 mg/dL   GFR calc non Af Amer 52 (*) >90 mL/min   GFR calc Af Amer 61 (*) >90 mL/min  LIPASE, BLOOD     Status: None   Collection Time    06/01/13  3:17 PM      Result Value Range   Lipase 54  11 - 59 U/L     No results found.  ROS:  As stated above in the HPI  otherwise negative.  Blood pressure 118/62, pulse 60, temperature 98.1 F (36.7 C), temperature source Oral, SpO2 96.00%.    PE: Gen: NAD, Alert and Oriented, hard of hearing HEENT:  [place]/AT, EOMI Neck: Supple, no LAD Lungs: CTA Bilaterally CV: RRR without M/G/R ABM: Soft, tender in the LLQ, +BS Ext: No C/C/E  Assessment/Plan: 1) Probable diverticulitis with a diverticular bleed. 2) Hematochezia. 3) LLQ abdominal pain.   Currently she is awaiting a bed in the ER, but her history of and physical examination is consistent with a diverticulitis and probable diverticular bleed.  I think a repeat CT scan will be helpful in this situation, however, basic blood work will be required.  She is not toxic appearing and she may not require an admission.  Plan: 1) Check CBC and BMP. 2) I will defer the decision for further imaging and the possibility of an admission to the ER as it will depend on her blood work.  Danuta Huseman D 06/01/2013, 4:18 PM

## 2013-06-01 NOTE — ED Provider Notes (Signed)
CSN: [id number]     Arrival date & time 06/01/13  1505 History   First MD Initiated Contact with Patient 06/01/13 1709     Chief Complaint  Patient presents with  . Rectal Bleeding   (Consider location/radiation/quality/duration/timing/severity/associated sxs/prior Treatment) The history is provided by the patient. No language interpreter was used.  Patient is an 79 year old African American female with past medical history diverticulosis comes emergency department today with abdominal pain and rectal bleeding. The abdominal pain has been present in the left lower quadrant for approximately 2 months. She's been evaluated by her primary care physician for this pain. She rates the pain at 4/10. She's had any nausea, vomiting, or diarrhea. She began having rectal bleeding 2 days ago. She describes her stools been soft in consistency. Has not had any straining. The bleeding does not have any clots however it is mixed throughout her stool.  She has not had any hematemesis. She denies shortness of breath, chest pain, lightheadedness, fevers, or dyspnea.   Past Medical History  Diagnosis Date  . HTN (hypertension)   . Dyslipidemia   . Diverticulosis   . Arthritis   . Diverticulitis   . PONV (postoperative nausea and vomiting)     N&V when awakening from anesthesia  . Dysrhythmia     irregular heart beat  . Shortness of breath     saw cardiologist for this  . UTI (urinary tract infection)     history of UTI  . Jaundice, obstructive, intrahepatic     s/p ERCP, lithotripsy, and stone removal 01/2011, 02/2011   Past Surgical History  Procedure Laterality Date  . Total knee arthroplasty      RIGHT KNEE  . Knee surgery      LEFT KNEE  . Total abdominal hysterectomy    . Cholecystectomy      removal of gallstones x4 (?ERCP)  . Foot surgery      x2  . Ercp      with lithotripsy and gall stone removal 2012  . Total knee arthroplasty  07/13/2011    Procedure: TOTAL KNEE ARTHROPLASTY;   Surgeon: Nestor Lewandowsky;  Location: [facility] OR;  Service: Orthopedics;  Laterality: Left;  Left Total Knee Arthroplasty with Hardware Removal    Family History  Problem Relation Age of Onset  . Pneumonia Mother   . Lung cancer Sister   . Hypertension Maternal Grandmother   . Stroke Maternal Grandmother    History  Substance Use Topics  . Smoking status: Former Smoker -- 0.50 packs/day for 40 years    Types: Cigarettes    Quit date: 07/05/1996  . Smokeless tobacco: Never Used  . Alcohol Use: 0.6 oz/week    1 Glasses of wine per week     Comment: occasional   OB History   Grav Para Term Preterm Abortions TAB SAB Ect Mult Living                 Review of Systems  Constitutional: Negative for fever, chills and unexpected weight change.  Respiratory: Negative for cough and shortness of breath.   Gastrointestinal: Positive for abdominal pain and blood in stool. Negative for nausea, vomiting, diarrhea, constipation and abdominal distention.  Genitourinary: Negative for dysuria, urgency and frequency.  Musculoskeletal: Negative for back pain and neck pain.  All other systems reviewed and are negative.    Allergies  Amitriptyline; Gabapentin; Meloxicam; and Tramadol  Home Medications   No current outpatient prescriptions on file. BP 162/68  Pulse 73  Temp(Src) 98.3 F (36.8 C) (Oral)  Resp 16  Ht 5' 5.5" (1.664 m)  Wt 154 lb 12.2 oz (70.2 kg)  BMI 25.35 kg/m2  SpO2 96% Physical Exam  Nursing note and vitals reviewed. Constitutional: She is oriented to person, place, and time. She appears well-developed and well-nourished. No distress.  HENT:  Head: Normocephalic and atraumatic.  Eyes: Pupils are equal, round, and reactive to light.  Neck: Normal range of motion.  Cardiovascular: Normal rate, regular rhythm, normal heart sounds and intact distal pulses.   Pulmonary/Chest: Effort normal. No respiratory distress. She has no wheezes. She exhibits no tenderness.  Abdominal:  Soft. Bowel sounds are normal. She exhibits no distension. There is tenderness in the left lower quadrant. There is guarding. There is no rigidity and no rebound.  Genitourinary:  Bright red blood per rectum.   Neurological: She is alert and oriented to person, place, and time. She has normal strength. No cranial nerve deficit or sensory deficit. She exhibits normal muscle tone. Coordination and gait normal.  Skin: Skin is warm and dry.    ED Course  Procedures (including critical care time) Labs Review Labs Reviewed  CBC WITH DIFFERENTIAL - Abnormal; Notable for the following:    RDW 15.7 (*)    All other components within normal limits  COMPREHENSIVE METABOLIC PANEL - Abnormal; Notable for the following:    GFR calc non Af Amer 52 (*)    GFR calc Af Amer 61 (*)    All other components within normal limits  CBC - Abnormal; Notable for the following:    RDW 15.7 (*)    All other components within normal limits  CLOSTRIDIUM DIFFICILE BY PCR  LIPASE, BLOOD  PROTIME-INR  URINALYSIS, ROUTINE W REFLEX MICROSCOPIC  BASIC METABOLIC PANEL  CBC  CG4 I-STAT (LACTIC ACID)  TYPE AND SCREEN   Imaging Review Ct Abdomen Pelvis W Contrast  06/01/2013   CLINICAL DATA:  Rectal bleeding. History of sphincterotomy and biliary stent placement.  EXAM: CT ABDOMEN AND PELVIS WITH CONTRAST  TECHNIQUE: Multidetector CT imaging of the abdomen and pelvis was performed using the standard protocol following bolus administration of intravenous contrast.  CONTRAST:  OMNIPAQUE IOHEXOL 300 MG/ML  SOLN  COMPARISON:  CT abdomen and pelvis 01/04/2013 and 01/06/2011  FINDINGS: There are scattered areas of atelectasis at the lung bases. Mild subpleural cystic changes in the medial right lower lobe are stable. Negative for pleural effusion. There is a small hiatal hernia.  Pneumobilia is again noted in both lobes of the liver. This is unchanged in appearance compared to CT abdomen pelvis of June 2014 and consistent  with history of prior sphincterotomy and bile duct stent placement. No focal hepatic lesion is identified. The pancreas appears stable. Mild pancreatic ductal dilatation and air within the pancreatic duct are again seen.  Spleen is normal in size and enhancement. There is a stable 3.9 cm cyst extending from the lower pole of the left kidney. There is no hydronephrosis on the left. The right kidney and adrenal glands are within normal limits.  Stomach is nondistended and appears within normal limits.  Small bowel loops are normal in caliber and wall thickness. Previously described fecalization of the distal/terminal ileum has resolved. The terminal ileum appears normal. A definite appendix is not visualized.  There is a moderate amount of stool throughout the colon and there is extensive colonic diverticulosis, most prominent in the sigmoid region. There is pericolonic stranding an injection/prominence of the pericolonic vessels near  the junction of the descending colon and proximal sigmoid colon. This finding can be seen in the setting of acute diverticulitis. The appearance is very similar to the CT of 01/04/2013 and is 01/06/2011. No extraluminal gas is seen. There is no evidence of abscess, and there is no of free fluid in the abdomen or pelvis. Negative for pneumoperitoneum.  An 8 mm lymph node adjacent to the sigmoid colon on image number 50 to appears stable dating back to a CT of 01/06/2011.  Urinary bladder is unremarkable. Patient is status post hysterectomy. Negative for adnexal mass.  There is atherosclerotic calcification of the aorta, iliac vasculature, and proximal femoral vessels. Although there is some ectasia of the infrarenal abdominal aorta, there is no aneurysm.  Fat containing bilateral inguinal region hernia is noted, left greater than right. These are stable.  There is chronic grade 1 anterolisthesis of L5 on S1, likely related to facet joint degenerative changes. There are no pars defects.  Minimal retrolisthesis of L3 on L4 is also stable. Minimal retrolisthesis of L2 on L3 is stable. The vertebral body heights are preserved. No suspicious osseous lesions.  IMPRESSION: 1. Extensive colonic diverticulosis. Focal stranding adjacent to the distal descending colon and proximal sigmoid colon could reflect acute diverticulitis. The pericolonic stranding in this region appears very similar to CT exams of June 2014 and June 2012. Findings could reflect recurrent acute diverticulitis, but the possibility of chronic stranding in this region is also considered.  2. Given the patient has extensive colonic diverticulosis, with associated colonic wall thickening, followup colonoscopy, when patient's acute illness resolved, should be considered to exclude malignancy.  3. Chronic pneumobilia and air within the pancreatic duct, in this patient with history of sphincterotomy and bile duct stent placement.  4. Atherosclerosis of the aorta and iliac vasculature.  5. Chronic bilateral inguinal region hernias containing fat only.  6. Stable degenerative changes with mild listhesis at several levels in the lumbar spine.   Electronically Signed   By: Britta Mccreedy M.D.   On: 06/01/2013 19:34    EKG Interpretation     Ventricular Rate:    PR Interval:    QRS Duration:   QT Interval:    QTC Calculation:   R Axis:     Text Interpretation:              MDM  This is an 79 year old African American female with past medical history of diverticulosis comes emergency department today with left lower quadrant abdominal pain and rectal bleeding. Physical exam as above. With significant left lower quadrant tenderness she was felt to require CT of her abdomen. The rest of the evaluation included a UA, CBC, lactic acid, PT/INR, BMP, and a lipase. This is 54. CMP was unremarkable. CBC was unremarkable. Hemoglobin was 12.8.  INR was 1.05. Lactic acid was 1.86. UA was unremarkable. CT of the abdomen demonstrated  possible acute diverticulitis versus chronic stranding in the left lower quadrant. There is also colon thickening concerning for malignant process.patient is being treated for diverticulitis with Cipro and Flagyl provided by her primary care physician. With significant GI bleeding and GI bleeding currently she was felt to require admission to the hospital for management of GI bleeding. He has already been evaluated by GI. Patient is currently hemodynamically stable she is not felt to require a blood transfusion or fluids at this time. Her pain was treated with a milligram of Dilaudid in the emergency department. Patient was admitted to the hospitalist service in  stable condition. Labs and imaging reviewed by myself and considered and medical decision-making. Imaging was interpreted on. Care was discussed with my attending Dr. Jeraldine Loots.   1. Diverticulitis   2. GI bleed       Bethann Berkshire, MD 06/01/13 2233

## 2013-06-01 NOTE — ED Notes (Signed)
Admitting MD at bedisde

## 2013-06-01 NOTE — ED Notes (Signed)
Attempted to start PIV w/o success due to poor veins.  IV Therapy team contacted about starting an IV

## 2013-06-01 NOTE — ED Notes (Signed)
Pt in c/o bright red rectal bleeding since Sunday, also c/o lower abd pain, pt has a history of same and was admitted for diverticulitis at that time, pt told to come here by PMD for further evaluation, pt states she is only seeing bleeding with bowel movements at this time.

## 2013-06-01 NOTE — ED Notes (Signed)
IV team at the bedside. 

## 2013-06-02 DIAGNOSIS — K5730 Diverticulosis of large intestine without perforation or abscess without bleeding: Secondary | ICD-10-CM

## 2013-06-02 LAB — HEMOGLOBIN AND HEMATOCRIT, BLOOD
HCT: 36.8 % (ref 36.0–46.0)
Hemoglobin: 12.5 g/dL (ref 12.0–15.0)

## 2013-06-02 LAB — CBC
HCT: 36.1 % (ref 36.0–46.0)
MCH: 29 pg (ref 26.0–34.0)
MCV: 88 fL (ref 78.0–100.0)
Platelets: 308 10*3/uL (ref 150–400)
RBC: 4.1 MIL/uL (ref 3.87–5.11)
RDW: 15.7 % — ABNORMAL HIGH (ref 11.5–15.5)

## 2013-06-02 LAB — BASIC METABOLIC PANEL
BUN: 14 mg/dL (ref 6–23)
CO2: 22 mEq/L (ref 19–32)
Calcium: 9 mg/dL (ref 8.4–10.5)
Chloride: 107 mEq/L (ref 96–112)
Creatinine, Ser: 0.78 mg/dL (ref 0.50–1.10)
Glucose, Bld: 75 mg/dL (ref 70–99)
Potassium: 3.9 mEq/L (ref 3.5–5.1)
Sodium: 140 mEq/L (ref 135–145)

## 2013-06-02 LAB — CLOSTRIDIUM DIFFICILE BY PCR: Toxigenic C. Difficile by PCR: NEGATIVE

## 2013-06-02 MED ORDER — SODIUM CHLORIDE 0.9 % IV SOLN
INTRAVENOUS | Status: AC
  Administered 2013-06-02: 16:00:00 via INTRAVENOUS

## 2013-06-02 NOTE — ED Provider Notes (Signed)
I have seen the patient with the resident physician, Dr. Craige Cotta.  The documentation is an accurate reflection of the patient's ED presentation, with the following additions:  On my exam this patient was in no distress.  We discussed, rectal bleeding, CT results, need for admission for further evaluation and management.  Gerhard Munch, MD 06/02/13 [id number]

## 2013-06-02 NOTE — Progress Notes (Signed)
[facility]                                                                                Patient Demographics  Kimberly Rasmussen, is a 79 y.o. female, DOB - 1925-08-31, ZOX:[id number]  Admit date - 06/01/2013   Admitting Physician Eduard Clos, MD  Outpatient Primary MD for the patient is Emeterio Reeve, MD  LOS - 1   Chief Complaint  Patient presents with  . Rectal Bleeding        Assessment & Plan    1. Acute recurrent sigmoid diverticulitis with mild lower GI bleed. H&H is stable, type screen done, patient improved with bowel rest, IV fluids and empiric IV antibiotics, she follows with GI physician Dr. Loreta Ave, she was seen by Dr. hung yesterday in the ER, she will require outpatient colonoscopy once acute problem has resolved. We will continue to monitor her clinically here.    2. Diverticular bleed. Monitor H&H, type screen done, goal will be to keep hemoglobin above 7.5     Code Status: Full  Family Communication:   Disposition Plan: Home   Procedures CT scan abdomen and pelvis   Consults  GI   Medications  Scheduled Meds: . ciprofloxacin  400 mg Intravenous Q12H  . metronidazole  500 mg Intravenous Q8H   Continuous Infusions: . sodium chloride 75 mL/hr at 06/01/13 2126   PRN Meds:.acetaminophen, acetaminophen, morphine injection, ondansetron (ZOFRAN) IV, ondansetron  DVT Prophylaxis    SCDs    Lab Results  Component Value Date   PLT 308 06/02/2013    Antibiotics    Anti-infectives   Start     Dose/Rate Route Frequency Ordered Stop   06/02/13 1000  ciprofloxacin (CIPRO) IVPB 400 mg     400 mg 200 mL/hr over 60 Minutes Intravenous Every 12 hours 06/01/13 2114     06/02/13 0400  metroNIDAZOLE (FLAGYL) IVPB 500 mg     500 mg 100 mL/hr over 60 Minutes Intravenous Every 8 hours 06/01/13 2054     06/01/13 2015  ciprofloxacin (CIPRO) IVPB 400 mg     400 mg 200 mL/hr over 60 Minutes Intravenous  Once 06/01/13 2002 06/02/13 0000    06/01/13 2015  metroNIDAZOLE (FLAGYL) IVPB 500 mg     500 mg 100 mL/hr over 60 Minutes Intravenous  Once 06/01/13 2002 06/01/13 2115          Subjective:   Grant Fontana today has, No headache, No chest pain, improved abdominal pain, no blood in stool  - No Nausea, No new weakness tingling or numbness, No Cough - SOB.    Objective:   Filed Vitals:   06/01/13 1513 06/01/13 2029 06/01/13 2054 06/02/13 0435  BP: 118/62 150/78 162/68 151/77  Pulse: 60 68 73 66  Temp: 98.1 F (36.7 C)  98.3 F (36.8 C) 97.7 F (36.5 C)  TempSrc: Oral  Oral Oral  Resp:  18 16 18   Height:   5' 5.5" (1.664 m)   Weight:   70.2 kg (154 lb 12.2 oz)   SpO2: 96% 97% 96% 98%    Wt Readings from Last 3 Encounters:  06/01/13 70.2 kg (154 lb 12.2 oz)  07/31/11 65.8 kg (145 lb 1 oz)  07/14/11 68.947 kg (152 lb)     Intake/Output Summary (Last 24 hours) at 06/02/13 1006 Last data filed at 06/02/13 0730  Gross per 24 hour  Intake   1055 ml  Output      0 ml  Net   1055 ml    Exam Awake Alert, Oriented X 3, No new F.N deficits, Normal affect [place].AT,PERRAL Supple Neck,No JVD, No cervical lymphadenopathy appriciated.  Symmetrical Chest wall movement, Good air movement bilaterally, CTAB RRR,No Gallops,Rubs or new Murmurs, No Parasternal Heave +ve B.Sounds, Abd Soft, LLQ is tender, No organomegaly appriciated, No rebound - guarding or rigidity. No Cyanosis, Clubbing or edema, No new Rash or bruise      Data Review   Micro Results No results found for this or any previous visit (from the past 240 hour(s)).  Radiology Reports Ct Abdomen Pelvis W Contrast  06/01/2013   CLINICAL DATA:  Rectal bleeding. History of sphincterotomy and biliary stent placement.  EXAM: CT ABDOMEN AND PELVIS WITH CONTRAST  TECHNIQUE: Multidetector CT imaging of the abdomen and pelvis was performed using the standard protocol following bolus administration of intravenous contrast.  CONTRAST:  OMNIPAQUE IOHEXOL 300  MG/ML  SOLN  COMPARISON:  CT abdomen and pelvis 01/04/2013 and 01/06/2011  FINDINGS: There are scattered areas of atelectasis at the lung bases. Mild subpleural cystic changes in the medial right lower lobe are stable. Negative for pleural effusion. There is a small hiatal hernia.  Pneumobilia is again noted in both lobes of the liver. This is unchanged in appearance compared to CT abdomen pelvis of June 2014 and consistent with history of prior sphincterotomy and bile duct stent placement. No focal hepatic lesion is identified. The pancreas appears stable. Mild pancreatic ductal dilatation and air within the pancreatic duct are again seen.  Spleen is normal in size and enhancement. There is a stable 3.9 cm cyst extending from the lower pole of the left kidney. There is no hydronephrosis on the left. The right kidney and adrenal glands are within normal limits.  Stomach is nondistended and appears within normal limits.  Small bowel loops are normal in caliber and wall thickness. Previously described fecalization of the distal/terminal ileum has resolved. The terminal ileum appears normal. A definite appendix is not visualized.  There is a moderate amount of stool throughout the colon and there is extensive colonic diverticulosis, most prominent in the sigmoid region. There is pericolonic stranding an injection/prominence of the pericolonic vessels near the junction of the descending colon and proximal sigmoid colon. This finding can be seen in the setting of acute diverticulitis. The appearance is very similar to the CT of 01/04/2013 and is 01/06/2011. No extraluminal gas is seen. There is no evidence of abscess, and there is no of free fluid in the abdomen or pelvis. Negative for pneumoperitoneum.  An 8 mm lymph node adjacent to the sigmoid colon on image number 50 to appears stable dating back to a CT of 01/06/2011.  Urinary bladder is unremarkable. Patient is status post hysterectomy. Negative for adnexal mass.   There is atherosclerotic calcification of the aorta, iliac vasculature, and proximal femoral vessels. Although there is some ectasia of the infrarenal abdominal aorta, there is no aneurysm.  Fat containing bilateral inguinal region hernia is noted, left greater than right. These are stable.  There is chronic grade 1 anterolisthesis of L5 on S1, likely related to facet joint degenerative changes. There are no pars defects. Minimal  retrolisthesis of L3 on L4 is also stable. Minimal retrolisthesis of L2 on L3 is stable. The vertebral body heights are preserved. No suspicious osseous lesions.  IMPRESSION: 1. Extensive colonic diverticulosis. Focal stranding adjacent to the distal descending colon and proximal sigmoid colon could reflect acute diverticulitis. The pericolonic stranding in this region appears very similar to CT exams of June 2014 and June 2012. Findings could reflect recurrent acute diverticulitis, but the possibility of chronic stranding in this region is also considered.  2. Given the patient has extensive colonic diverticulosis, with associated colonic wall thickening, followup colonoscopy, when patient's acute illness resolved, should be considered to exclude malignancy.  3. Chronic pneumobilia and air within the pancreatic duct, in this patient with history of sphincterotomy and bile duct stent placement.  4. Atherosclerosis of the aorta and iliac vasculature.  5. Chronic bilateral inguinal region hernias containing fat only.  6. Stable degenerative changes with mild listhesis at several levels in the lumbar spine.   Electronically Signed   By: Britta Mccreedy M.D.   On: 06/01/2013 19:34    CBC  Recent Labs Lab 06/01/13 1517 06/01/13 2121 06/02/13 0711  WBC 7.7 7.1 6.6  HGB 12.8 12.7 11.9*  HCT 38.0 37.7 36.1  PLT 329 301 308  MCV 87.6 87.9 88.0  MCH 29.5 29.6 29.0  MCHC 33.7 33.7 33.0  RDW 15.7* 15.7* 15.7*  LYMPHSABS 1.7  --   --   MONOABS 0.7  --   --   EOSABS 0.2  --   --    BASOSABS 0.0  --   --     Chemistries   Recent Labs Lab 06/01/13 1517 06/02/13 0402  NA 141 140  K 4.5 3.9  CL 106 107  CO2 23 22  GLUCOSE 79 75  BUN 21 14  CREATININE 0.96 0.78  CALCIUM 9.4 9.0  AST 23  --   ALT 10  --   ALKPHOS 61  --   BILITOT 0.3  --    ------------------------------------------------------------------------------------------------------------------ estimated creatinine clearance is 51.1 ml/min (by C-G formula based on Cr of 0.78). ------------------------------------------------------------------------------------------------------------------ No results found for this basename: HGBA1C,  in the last 72 hours ------------------------------------------------------------------------------------------------------------------ No results found for this basename: CHOL, HDL, LDLCALC, TRIG, CHOLHDL, LDLDIRECT,  in the last 72 hours ------------------------------------------------------------------------------------------------------------------ No results found for this basename: TSH, T4TOTAL, FREET3, T3FREE, THYROIDAB,  in the last 72 hours ------------------------------------------------------------------------------------------------------------------ No results found for this basename: VITAMINB12, FOLATE, FERRITIN, TIBC, IRON, RETICCTPCT,  in the last 72 hours  Coagulation profile  Recent Labs Lab 06/01/13 1835  INR 1.05    No results found for this basename: DDIMER,  in the last 72 hours  Cardiac Enzymes No results found for this basename: CK, CKMB, TROPONINI, MYOGLOBIN,  in the last 168 hours ------------------------------------------------------------------------------------------------------------------ No components found with this basename: POCBNP,      Time Spent in minutes  35   SINGH,PRASHANT K M.D on 06/02/2013 at 10:06 AM  Between 7am to 7pm - Pager - [phone]  After 7pm go to [email or web address] - password [email or web address]  And look for the  night coverage person covering for me after hours  [facility] Office  [phone]

## 2013-06-03 DIAGNOSIS — E785 Hyperlipidemia, unspecified: Secondary | ICD-10-CM

## 2013-06-03 DIAGNOSIS — D649 Anemia, unspecified: Secondary | ICD-10-CM

## 2013-06-03 DIAGNOSIS — I10 Essential (primary) hypertension: Secondary | ICD-10-CM

## 2013-06-03 LAB — HEMOGLOBIN AND HEMATOCRIT, BLOOD: HCT: 37.6 % (ref 36.0–46.0)

## 2013-06-03 LAB — BASIC METABOLIC PANEL
CO2: 24 mEq/L (ref 19–32)
Calcium: 8.9 mg/dL (ref 8.4–10.5)
Chloride: 111 mEq/L (ref 96–112)
GFR calc non Af Amer: 75 mL/min — ABNORMAL LOW (ref >90)
Glucose, Bld: 88 mg/dL (ref 70–99)
Potassium: 3.6 mEq/L (ref 3.5–5.1)
Sodium: 144 mEq/L (ref 135–145)

## 2013-06-03 MED ORDER — HYDRALAZINE HCL 20 MG/ML IJ SOLN
5.0000 mg | Freq: Four times a day (QID) | INTRAMUSCULAR | Status: DC | PRN
  Administered 2013-06-03: 5 mg via INTRAVENOUS
  Filled 2013-06-03: qty 1

## 2013-06-03 NOTE — Progress Notes (Signed)
06/03/13 Patient to be discharged today.IV site will be removed after 5pm antibiotic is completed. Discharge instructions will reviewed with patient and family.

## 2013-06-03 NOTE — Discharge Summary (Signed)
[facility]                                                                                   Kimberly Rasmussen, is a 79 y.o. female  DOB 06/06/1926  MRN [id number].  Admission date:  06/01/2013  Admitting Physician  Eduard Clos, MD  Discharge Date:  06/03/2013   Primary MD  Emeterio Reeve, MD  Recommendations for primary care physician for things to follow:   Please follow CBC BMP, make sure patient follows with recommended GI physician within a week   Admission Diagnosis  Diverticulitis [562.11] GI bleed [578.9]  Discharge Diagnosis  acute diverticulitis  Active Problems:   GI bleed   Diverticulitis      Past Medical History  Diagnosis Date  . HTN (hypertension)   . Dyslipidemia   . Diverticulosis   . Arthritis   . Diverticulitis   . PONV (postoperative nausea and vomiting)     N&V when awakening from anesthesia  . Dysrhythmia     irregular heart beat  . Shortness of breath     saw cardiologist for this  . UTI (urinary tract infection)     history of UTI  . Jaundice, obstructive, intrahepatic     s/p ERCP, lithotripsy, and stone removal 01/2011, 02/2011    Past Surgical History  Procedure Laterality Date  . Total knee arthroplasty      RIGHT KNEE  . Knee surgery      LEFT KNEE  . Total abdominal hysterectomy    . Cholecystectomy      removal of gallstones x4 (?ERCP)  . Foot surgery      x2  . Ercp      with lithotripsy and gall stone removal 2012  . Total knee arthroplasty  07/13/2011    Procedure: TOTAL KNEE ARTHROPLASTY;  Surgeon: Nestor Lewandowsky;  Location: [facility] OR;  Service: Orthopedics;  Laterality: Left;  Left Total Knee Arthroplasty with Hardware Removal      Discharge Condition: Stable       Follow-up Information   Follow up with [name] A, MD. Schedule an appointment as soon as possible for a visit in 4 days.   Specialty:  Family Medicine   Contact information:   [address] [place]  [address] [phone]       Follow up with Beverley Fiedler, MD. Schedule an appointment as soon as possible for a visit in 1 week. (Or Dr. Elnoria Howard)    Specialty:  Gastroenterology   Contact information:   [address] [place] [address] [phone]         Consults obtained - Dr. Elnoria Howard GI   Discharge Medications      Medication List    STOP taking these medications       celecoxib 200 MG capsule  Commonly known as:  CELEBREX      TAKE these medications       alendronate 70 MG tablet  Commonly known as:  FOSAMAX  Take 70 mg by mouth once a week. Take with a full glass of water on an empty stomach.     calcium-vitamin D 500-200  MG-UNIT per tablet  Commonly known as:  OSCAL WITH D  Take 1 tablet by mouth daily with breakfast.     ciprofloxacin 500 MG tablet  Commonly known as:  CIPRO  Take 500 mg by mouth 2 (two) times daily.     glucosamine-chondroitin 500-400 MG tablet  Take 1 tablet by mouth 3 (three) times daily.     HYDROcodone-acetaminophen 5-325 MG per tablet  Commonly known as:  NORCO/VICODIN  Take 1 tablet by mouth every 6 (six) hours as needed for moderate pain.     metroNIDAZOLE 500 MG tablet  Commonly known as:  FLAGYL  Take 500 mg by mouth 3 (three) times daily.     MULTIVITAMIN PO  Take 1 tablet by mouth daily.     polyethylene glycol packet  Commonly known as:  MIRALAX / GLYCOLAX  Take 17 g by mouth daily as needed for mild constipation.         Diet and Activity recommendation: See Discharge Instructions below   Discharge Instructions     Follow with Primary MD Emeterio Reeve, MD in 4 days   Get CBC, CMP, checked 4 days by Primary MD and again as instructed by your Primary MD. Get a 2 view Chest X ray done next visit if you had Pneumonia of Lung problems at the Hospital.  Get Medicines reviewed and adjusted.  Please request your Prim.MD to go over all Hospital Tests and Procedure/Radiological results at the follow up, please  get all Hospital records sent to your Prim MD by signing hospital release before you go home.  Activity: As tolerated with Full fall precautions use walker/cane & assistance as needed   Diet:  Soft diet for a week advance as tolerated to your previous diet which should be heart healthy  For Heart failure patients - Check your Weight same time everyday, if you gain over 2 pounds, or you develop in leg swelling, experience more shortness of breath or chest pain, call your Primary MD immediately. Follow Cardiac Low Salt Diet and 1.8 lit/day fluid restriction.  Disposition Home    If you experience worsening of your admission symptoms, develop shortness of breath, life threatening emergency, suicidal or homicidal thoughts you must seek medical attention immediately by calling 911 or calling your MD immediately  if symptoms less severe.  You Must read complete instructions/literature along with all the possible adverse reactions/side effects for all the Medicines you take and that have been prescribed to you. Take any new Medicines after you have completely understood and accpet all the possible adverse reactions/side effects.   Do not drive and provide baby sitting services if your were admitted for syncope or siezures until you have seen by Primary MD or a Neurologist and advised to do so again.  Do not drive when taking Pain medications.    Do not take more than prescribed Pain, Sleep and Anxiety Medications  Special Instructions: If you have smoked or chewed Tobacco  in the last 2 yrs please stop smoking, stop any regular Alcohol  and or any Recreational drug use.  Wear Seat belts while driving.   Please note  You were cared for by a hospitalist during your hospital stay. If you have any questions about your discharge medications or the care you received while you were in the hospital after you are discharged, you can call the unit and asked to speak with the hospitalist on call if the  hospitalist that took care of you is not available.  Once you are discharged, your primary care physician will handle any further medical issues. Please note that NO REFILLS for any discharge medications will be authorized once you are discharged, as it is imperative that you return to your primary care physician (or establish a relationship with a primary care physician if you do not have one) for your aftercare needs so that they can reassess your need for medications and monitor your lab values.    Major procedures and Radiology Reports - PLEASE review detailed and final reports for all details, in brief -       Ct Abdomen Pelvis W Contrast  06/01/2013   CLINICAL DATA:  Rectal bleeding. History of sphincterotomy and biliary stent placement.  EXAM: CT ABDOMEN AND PELVIS WITH CONTRAST  TECHNIQUE: Multidetector CT imaging of the abdomen and pelvis was performed using the standard protocol following bolus administration of intravenous contrast.  CONTRAST:  OMNIPAQUE IOHEXOL 300 MG/ML  SOLN  COMPARISON:  CT abdomen and pelvis 01/04/2013 and 01/06/2011  FINDINGS: There are scattered areas of atelectasis at the lung bases. Mild subpleural cystic changes in the medial right lower lobe are stable. Negative for pleural effusion. There is a small hiatal hernia.  Pneumobilia is again noted in both lobes of the liver. This is unchanged in appearance compared to CT abdomen pelvis of June 2014 and consistent with history of prior sphincterotomy and bile duct stent placement. No focal hepatic lesion is identified. The pancreas appears stable. Mild pancreatic ductal dilatation and air within the pancreatic duct are again seen.  Spleen is normal in size and enhancement. There is a stable 3.9 cm cyst extending from the lower pole of the left kidney. There is no hydronephrosis on the left. The right kidney and adrenal glands are within normal limits.  Stomach is nondistended and appears within normal limits.  Small  bowel loops are normal in caliber and wall thickness. Previously described fecalization of the distal/terminal ileum has resolved. The terminal ileum appears normal. A definite appendix is not visualized.  There is a moderate amount of stool throughout the colon and there is extensive colonic diverticulosis, most prominent in the sigmoid region. There is pericolonic stranding an injection/prominence of the pericolonic vessels near the junction of the descending colon and proximal sigmoid colon. This finding can be seen in the setting of acute diverticulitis. The appearance is very similar to the CT of 01/04/2013 and is 01/06/2011. No extraluminal gas is seen. There is no evidence of abscess, and there is no of free fluid in the abdomen or pelvis. Negative for pneumoperitoneum.  An 8 mm lymph node adjacent to the sigmoid colon on image number 50 to appears stable dating back to a CT of 01/06/2011.  Urinary bladder is unremarkable. Patient is status post hysterectomy. Negative for adnexal mass.  There is atherosclerotic calcification of the aorta, iliac vasculature, and proximal femoral vessels. Although there is some ectasia of the infrarenal abdominal aorta, there is no aneurysm.  Fat containing bilateral inguinal region hernia is noted, left greater than right. These are stable.  There is chronic grade 1 anterolisthesis of L5 on S1, likely related to facet joint degenerative changes. There are no pars defects. Minimal retrolisthesis of L3 on L4 is also stable. Minimal retrolisthesis of L2 on L3 is stable. The vertebral body heights are preserved. No suspicious osseous lesions.  IMPRESSION: 1. Extensive colonic diverticulosis. Focal stranding adjacent to the distal descending colon and proximal sigmoid colon could reflect acute diverticulitis. The pericolonic stranding  in this region appears very similar to CT exams of June 2014 and June 2012. Findings could reflect recurrent acute diverticulitis, but the  possibility of chronic stranding in this region is also considered.  2. Given the patient has extensive colonic diverticulosis, with associated colonic wall thickening, followup colonoscopy, when patient's acute illness resolved, should be considered to exclude malignancy.  3. Chronic pneumobilia and air within the pancreatic duct, in this patient with history of sphincterotomy and bile duct stent placement.  4. Atherosclerosis of the aorta and iliac vasculature.  5. Chronic bilateral inguinal region hernias containing fat only.  6. Stable degenerative changes with mild listhesis at several levels in the lumbar spine.   Electronically Signed   By: Britta Mccreedy M.D.   On: 06/01/2013 19:34    Micro Results      Recent Results (from the past 240 hour(s))  CLOSTRIDIUM DIFFICILE BY PCR     Status: None   Collection Time    06/02/13  1:55 PM      Result Value Range Status   C difficile by pcr NEGATIVE  NEGATIVE Final     History of present illness and  Hospital Course:     Kindly see H&P for history of present illness and admission details, please review complete Labs, Consult reports and Test reports for all details in brief Kimberly Rasmussen, is a 79 y.o. female, patient with history of  diverticulosis and diverticulitis in the past, diverticular bleed in the past, presented to the hospital with worsening left lower quadrant abdominal pain and mild rectal bleed x1, her workup was suggestive of possible acute diverticulitis, she was treated in the hospital with bowel rest, IV fluids and empiric IV antibiotics, she showed remarkable improvement and is now feeling much better and eager to go home. She has had 2 bowel movements, no more blood in stool, H&H remained stable without requiring any transfusions.  Seen in the ER by GI physician Dr. Elnoria Howard and she has seen Dr. Rhea Belton also in the past, she will now be transitioned to oral Cipro and Flagyl and was requested to continue on a soft diet for another  week, she will take MiraLAX to avoid constipation, she is been advised that if her pain gets worse or she runs a fever or if she develops blood in her stool she should come back to the ER promptly.      Today   Subjective:   Kimberly Rasmussen today has no headache,no chest abdominal pain,no new weakness tingling or numbness, feels much better wants to go home today.   Objective:   Blood pressure 121/75, pulse 70, temperature 97.8 F (36.6 C), temperature source Oral, resp. rate 17, height 5' 5.5" (1.664 m), weight 72.7 kg (160 lb 4.4 oz), SpO2 98.00%.   Intake/Output Summary (Last 24 hours) at 06/03/13 0930 Last data filed at 06/03/13 0710  Gross per 24 hour  Intake 2482.5 ml  Output    401 ml  Net 2081.5 ml    Exam Awake Alert, Oriented *3, No new F.N deficits, Normal affect [place].AT,PERRAL Supple Neck,No JVD, No cervical lymphadenopathy appriciated.  Symmetrical Chest wall movement, Good air movement bilaterally, CTAB RRR,No Gallops,Rubs or new Murmurs, No Parasternal Heave +ve B.Sounds, Abd Soft, mild left lower quadrant tenderness, No organomegaly appriciated, No rebound -guarding or rigidity. No Cyanosis, Clubbing or edema, No new Rash or bruise  Data Review   CBC w Diff: Lab Results  Component Value Date   WBC 6.6 06/02/2013  HGB 12.5 06/03/2013   HCT 37.6 06/03/2013   PLT 308 06/02/2013   LYMPHOPCT 22 06/01/2013   BANDSPCT 29* 01/05/2011   MONOPCT 10 06/01/2013   EOSPCT 2 06/01/2013   BASOPCT 0 06/01/2013    CMP: Lab Results  Component Value Date   NA 144 06/03/2013   K 3.6 06/03/2013   CL 111 06/03/2013   CO2 24 06/03/2013   BUN 7 06/03/2013   CREATININE 0.74 06/03/2013   PROT 7.2 06/01/2013   ALBUMIN 3.5 06/01/2013   BILITOT 0.3 06/01/2013   ALKPHOS 61 06/01/2013   AST 23 06/01/2013   ALT 10 06/01/2013  .   Total Time in preparing paper work, data evaluation and todays exam - 35 minutes  Leroy Sea M.D on 06/03/2013 at 9:30 AM  [facility] Office  [phone]

## 2013-06-05 ENCOUNTER — Other Ambulatory Visit (HOSPITAL_COMMUNITY): Payer: Self-pay | Admitting: Family Medicine

## 2013-06-05 DIAGNOSIS — Z1231 Encounter for screening mammogram for malignant neoplasm of breast: Secondary | ICD-10-CM

## 2013-06-26 ENCOUNTER — Ambulatory Visit (HOSPITAL_COMMUNITY)
Admission: RE | Admit: 2013-06-26 | Discharge: 2013-06-26 | Disposition: A | Discharge status: Home / Self Care | Payer: Medicare Other | Source: Ambulatory Visit | Attending: Family Medicine | Admitting: Family Medicine

## 2013-06-26 DIAGNOSIS — Z1231 Encounter for screening mammogram for malignant neoplasm of breast: Secondary | ICD-10-CM | POA: Insufficient documentation

## 2013-12-28 ENCOUNTER — Encounter (HOSPITAL_COMMUNITY): Payer: Self-pay | Admitting: Emergency Medicine

## 2013-12-28 ENCOUNTER — Emergency Department (HOSPITAL_COMMUNITY): Payer: Medicare Other

## 2013-12-28 ENCOUNTER — Emergency Department (HOSPITAL_COMMUNITY)
Admission: EM | Admit: 2013-12-28 | Discharge: 2013-12-28 | Disposition: A | Discharge status: Home / Self Care | Payer: Medicare Other | Attending: Emergency Medicine | Admitting: Emergency Medicine

## 2013-12-28 DIAGNOSIS — Z87891 Personal history of nicotine dependence: Secondary | ICD-10-CM | POA: Insufficient documentation

## 2013-12-28 DIAGNOSIS — Z87440 Personal history of urinary (tract) infections: Secondary | ICD-10-CM | POA: Insufficient documentation

## 2013-12-28 DIAGNOSIS — K219 Gastro-esophageal reflux disease without esophagitis: Secondary | ICD-10-CM | POA: Insufficient documentation

## 2013-12-28 DIAGNOSIS — R51 Headache: Secondary | ICD-10-CM | POA: Insufficient documentation

## 2013-12-28 DIAGNOSIS — Z79899 Other long term (current) drug therapy: Secondary | ICD-10-CM | POA: Insufficient documentation

## 2013-12-28 DIAGNOSIS — R519 Headache, unspecified: Secondary | ICD-10-CM

## 2013-12-28 DIAGNOSIS — Z862 Personal history of diseases of the blood and blood-forming organs and certain disorders involving the immune mechanism: Secondary | ICD-10-CM | POA: Insufficient documentation

## 2013-12-28 DIAGNOSIS — I10 Essential (primary) hypertension: Secondary | ICD-10-CM | POA: Insufficient documentation

## 2013-12-28 DIAGNOSIS — M129 Arthropathy, unspecified: Secondary | ICD-10-CM | POA: Insufficient documentation

## 2013-12-28 DIAGNOSIS — Z8639 Personal history of other endocrine, nutritional and metabolic disease: Secondary | ICD-10-CM | POA: Insufficient documentation

## 2013-12-28 LAB — CBC
HCT: 42 % (ref 36.0–46.0)
HEMOGLOBIN: 13.7 g/dL (ref 12.0–15.0)
MCH: 29.1 pg (ref 26.0–34.0)
MCHC: 32.6 g/dL (ref 30.0–36.0)
MCV: 89.2 fL (ref 78.0–100.0)
Platelets: 301 10*3/uL (ref 150–400)
RBC: 4.71 MIL/uL (ref 3.87–5.11)
RDW: 15.2 % (ref 11.5–15.5)
WBC: 9.1 10*3/uL (ref 4.0–10.5)

## 2013-12-28 LAB — BASIC METABOLIC PANEL
BUN: 21 mg/dL (ref 6–23)
CO2: 25 mEq/L (ref 19–32)
Calcium: 10.2 mg/dL (ref 8.4–10.5)
Chloride: 103 mEq/L (ref 96–112)
Creatinine, Ser: 0.97 mg/dL (ref 0.50–1.10)
GFR calc Af Amer: 60 mL/min — ABNORMAL LOW (ref >90)
GFR, EST NON AFRICAN AMERICAN: 51 mL/min — AB (ref >90)
GLUCOSE: 137 mg/dL — AB (ref 70–99)
POTASSIUM: 4.4 meq/L (ref 3.7–5.3)
Sodium: 141 mEq/L (ref 137–147)

## 2013-12-28 LAB — I-STAT TROPONIN, ED: Troponin i, poc: 0 ng/mL (ref 0.00–0.08)

## 2013-12-28 MED ORDER — CETIRIZINE HCL 10 MG PO TABS: 10.0000 mg | ORAL_TABLET | Freq: Every day | ORAL | Status: DC

## 2013-12-28 MED ORDER — RANITIDINE HCL 150 MG PO TABS: 150.0000 mg | ORAL_TABLET | Freq: Two times a day (BID) | ORAL | Status: DC

## 2013-12-28 NOTE — ED Notes (Signed)
Pt reports headache and chest pain for the past couple of weeks. Reports that pain is in the center of her chest. Denies any cardiac hx, no SOB or N/v.

## 2013-12-28 NOTE — ED Provider Notes (Signed)
CSN: [id number]     Arrival date & time 12/28/13  1858 History   First MD Initiated Contact with Patient 12/28/13 2025     Chief Complaint  Patient presents with  . Chest Pain  . Headache     (Consider location/radiation/quality/duration/timing/severity/associated sxs/prior Treatment) Patient is a 80 y.o. female presenting with chest pain. The history is provided by the patient.  Chest Pain Pain location:  Substernal area and L chest Pain quality: dull and pressure   Pain radiates to:  Does not radiate Pain radiates to the back: no   Pain severity:  Moderate Onset quality:  Gradual Duration:  4 days Timing:  Constant Progression:  Unchanged Chronicity:  New Context comment:  Started without preceding sx after her she started to get head throbbing Relieved by:  None tried Exacerbated by: lying down.  Not different with eating, deep breathing and no cough. Associated symptoms: headache and weakness   Associated symptoms: no abdominal pain, no altered mental status, no anorexia, no back pain, no cough, no fever, no nausea, no palpitations, no shortness of breath, no syncope and not vomiting   Associated symptoms comment:  Mild generalized weakness for the last few days Headaches:    Severity:  Moderate (stated developed and throbbing pain in the back of head and top of head for the last several days.  no vision problems, focal weakness or fever)   Onset quality:  Gradual   Duration:  4 days   Timing:  Constant   Progression:  Unchanged   Chronicity:  New Risk factors: high cholesterol   Risk factors: no aortic disease, no coronary artery disease, no diabetes mellitus, no hypertension, no immobilization, no smoking and no surgery     Past Medical History  Diagnosis Date  . HTN (hypertension)   . Dyslipidemia   . Diverticulosis   . Arthritis   . Diverticulitis   . PONV (postoperative nausea and vomiting)     N&V when awakening from anesthesia  . Dysrhythmia     irregular  heart beat  . Shortness of breath     saw cardiologist for this  . UTI (urinary tract infection)     history of UTI  . Jaundice, obstructive, intrahepatic     s/p ERCP, lithotripsy, and stone removal 01/2011, 02/2011   Past Surgical History  Procedure Laterality Date  . Total knee arthroplasty      RIGHT KNEE  . Knee surgery      LEFT KNEE  . Total abdominal hysterectomy    . Cholecystectomy      removal of gallstones x4 (?ERCP)  . Foot surgery      x2  . Ercp      with lithotripsy and gall stone removal 2012  . Total knee arthroplasty  07/13/2011    Procedure: TOTAL KNEE ARTHROPLASTY;  Surgeon: Nestor Lewandowsky;  Location: [facility];  Service: Orthopedics;  Laterality: Left;  Left Total Knee Arthroplasty with Hardware Removal    Family History  Problem Relation Age of Onset  . Pneumonia Mother   . Lung cancer Sister   . Hypertension Maternal Grandmother   . Stroke Maternal Grandmother    History  Substance Use Topics  . Smoking status: Former Smoker -- 0.50 packs/day for 40 years    Types: Cigarettes    Quit date: 07/05/1996  . Smokeless tobacco: Never Used  . Alcohol Use: 0.6 oz/week    1 Glasses of wine per week     Comment: occasional  OB History   Grav Para Term Preterm Abortions TAB SAB Ect Mult Living                 Review of Systems  Constitutional: Negative for fever.  Respiratory: Negative for cough and shortness of breath.   Cardiovascular: Positive for chest pain. Negative for palpitations and syncope.  Gastrointestinal: Negative for nausea, vomiting, abdominal pain and anorexia.  Musculoskeletal: Negative for back pain.  Neurological: Positive for weakness and headaches.  All other systems reviewed and are negative.     Allergies  Amitriptyline; Gabapentin; Meloxicam; and Tramadol  Home Medications   Prior to Admission medications   Medication Sig Start Date End Date Taking? Authorizing Provider  alendronate (FOSAMAX) 70 MG tablet Take 70 mg by  mouth once a week. Take with a full glass of water on an empty stomach.   Yes Historical Provider, MD  calcium-vitamin D (OSCAL WITH D) 500-200 MG-UNIT per tablet Take 1 tablet by mouth daily with breakfast.   Yes Historical Provider, MD  celecoxib (CELEBREX) 200 MG capsule Take 200 mg by mouth 2 (two) times daily. 12/08/13  Yes Historical Provider, MD  Glucosamine-Chondroit-Vit C-Mn (GLUCOSAMINE 1500 COMPLEX PO) Take 1,500 mg by mouth daily.   Yes Historical Provider, MD  HYDROcodone-acetaminophen (NORCO/VICODIN) 5-325 MG per tablet Take 1 tablet by mouth every 6 (six) hours as needed for moderate pain.   Yes Historical Provider, MD  Multiple Vitamins-Minerals (MULTIVITAMIN PO) Take 1 tablet by mouth daily.   Yes Historical Provider, MD  polyethylene glycol (MIRALAX / GLYCOLAX) packet Take 17 g by mouth daily as needed for mild constipation.   Yes Historical Provider, MD  Probiotic Product (RESTORA PO) Take 1 capsule by mouth daily.   Yes Historical Provider, MD  RESTASIS 0.05 % ophthalmic emulsion Place 1 drop into both eyes daily. 12/06/13  Yes Historical Provider, MD   BP 154/66  Pulse 93  Temp(Src) 98.7 F (37.1 C) (Oral)  Resp 16  Ht 5\' 4"  (1.626 m)  Wt 160 lb (72.576 kg)  BMI 27.45 kg/m2  SpO2 95% Physical Exam  Nursing note and vitals reviewed. Constitutional: She is oriented to person, place, and time. She appears well-developed and well-nourished. No distress.  HENT:  Head: Normocephalic and atraumatic.  Right Ear: Tympanic membrane normal.  Left Ear: Tympanic membrane normal.  Mouth/Throat: Oropharynx is clear and moist.  Eyes: Conjunctivae and EOM are normal. Pupils are equal, round, and reactive to light.  No temporal artery tenderness  Neck: Normal range of motion. Neck supple. No spinous process tenderness and no muscular tenderness present. Carotid bruit is not present.  Cardiovascular: Normal rate, regular rhythm and intact distal pulses.   No murmur  heard. Pulmonary/Chest: Effort normal and breath sounds normal. No respiratory distress. She has no wheezes. She has no rales. She exhibits tenderness.    Abdominal: Soft. She exhibits no distension. There is no tenderness. There is no rebound and no guarding.  Musculoskeletal: Normal range of motion. She exhibits no edema and no tenderness.  Neurological: She is alert and oriented to person, place, and time. She has normal strength. No cranial nerve deficit or sensory deficit. Coordination and gait normal.  Normal heel to shin testing.  Normal finger to nose testing  Skin: Skin is warm and dry. No rash noted. No erythema.  Psychiatric: She has a normal mood and affect. Her behavior is normal.    ED Course  Procedures (including critical care time) Labs Review Labs Reviewed  BASIC  METABOLIC PANEL - Abnormal; Notable for the following:    Glucose, Bld 137 (*)    GFR calc non Af Amer 51 (*)    GFR calc Af Amer 60 (*)    All other components within normal limits  CBC  URINALYSIS, ROUTINE W REFLEX MICROSCOPIC  I-STAT TROPOININ, ED    Imaging Review Dg Chest 2 View  12/28/2013   CLINICAL DATA:  Chest pain.  EXAM: CHEST  2 VIEW  COMPARISON:  July 06, 2011.  FINDINGS: The heart size and mediastinal contours are within normal limits. Both lungs are clear. No pneumothorax or pleural effusion is noted. The visualized skeletal structures are unremarkable.  IMPRESSION: No acute cardiopulmonary abnormality seen.   Electronically Signed   By: Roque Lias M.D.   On: 12/28/2013 21:03   Ct Head Wo Contrast  12/28/2013   CLINICAL DATA:  Head and sinus congestion  EXAM: CT HEAD WITHOUT CONTRAST  CT PARANASAL SINUS LIMITED WITHOUT CONTRAST  TECHNIQUE: Standard noncontrast axial head CT images were obtained.  Non-contiguous Multidetector CT images of the paranasal sinuses were obtained in a single plane without contrast.  COMPARISON:  Head CT - 02/24/2008  FINDINGS: There is mild likely age-appropriate  atrophy with diffuse sulcal prominence and mild centralized volume loss with mild demonstrates ex vacuo dilatation of the ventricular system. Scattered minimal periventricular hypodensities, the sequela of prior microvascular ischemic disease. Minimal calcifications within the right basal ganglia. The gray-white differentiation is otherwise well maintained without CT evidence of acute large territory infarct. There is mild exuberant calcification within the anterior aspect of the midline falx, unchanged. No intraparenchymal or extra-axial mass or hemorrhage. Unchanged size and configuration of the ventricles and basilar cisterns. No midline shift intracranial atherosclerosis.  There is under pneumatization of the right frontal sinus. The remaining paranasal sinuses and mastoid air cells are normally aerated. No air-fluid levels.  Regional soft tissues appear normal. Post left-sided cataract surgery. No displaced calvarial fracture.  IMPRESSION: 1. Mild, likely age-appropriate atrophy and microvascular ischemic disease without acute intracranial process. 2. Unremarkable sinus CT. Specifically, no air-fluid levels to suggest acute sinusitis.   Electronically Signed   By: Simonne Come M.D.   On: 12/28/2013 21:25   Ct Maxillofacial  Ltd Wo Cm  12/28/2013   CLINICAL DATA:  Head and sinus congestion  EXAM: CT HEAD WITHOUT CONTRAST  CT PARANASAL SINUS LIMITED WITHOUT CONTRAST  TECHNIQUE: Standard noncontrast axial head CT images were obtained.  Non-contiguous Multidetector CT images of the paranasal sinuses were obtained in a single plane without contrast.  COMPARISON:  Head CT - 02/24/2008  FINDINGS: There is mild likely age-appropriate atrophy with diffuse sulcal prominence and mild centralized volume loss with mild demonstrates ex vacuo dilatation of the ventricular system. Scattered minimal periventricular hypodensities, the sequela of prior microvascular ischemic disease. Minimal calcifications within the right basal  ganglia. The gray-white differentiation is otherwise well maintained without CT evidence of acute large territory infarct. There is mild exuberant calcification within the anterior aspect of the midline falx, unchanged. No intraparenchymal or extra-axial mass or hemorrhage. Unchanged size and configuration of the ventricles and basilar cisterns. No midline shift intracranial atherosclerosis.  There is under pneumatization of the right frontal sinus. The remaining paranasal sinuses and mastoid air cells are normally aerated. No air-fluid levels.  Regional soft tissues appear normal. Post left-sided cataract surgery. No displaced calvarial fracture.  IMPRESSION: 1. Mild, likely age-appropriate atrophy and microvascular ischemic disease without acute intracranial process. 2. Unremarkable sinus CT. Specifically,  no air-fluid levels to suggest acute sinusitis.   Electronically Signed   By: Simonne ComeJohn  Watts M.D.   On: 12/28/2013 21:25     EKG Interpretation   Date/Time:  Thursday December 28 2013 19:03:32 EDT Ventricular Rate:  89 PR Interval:  148 QRS Duration: 74 QT Interval:  372 QTC Calculation: 452 R Axis:   80 Text Interpretation:  Sinus rhythm with frequent Premature ventricular  complexes Nonspecific ST abnormality No significant change since last  tracing Confirmed by Anitra LauthPLUNKETT  MD, Alphonzo LemmingsWHITNEY ([id number]) on 12/28/2013 8:21:26 PM      MDM   Final diagnoses:  Headache  GERD (gastroesophageal reflux disease)    Patient with atypical symptoms of a throbbing headache and chest pain that started approximately 4 days ago. One week ago she was treated for sinus symptoms with an antibiotic but states she continues to have sinus congestion. Today she took a Vicodin and Celebrex without improvement of her headache. The chest pain is worse with lying down but has no other exacerbating features. She has had some generalized fatigue and weakness but denies any fever or focal symptoms concerning for stroke. She has no  history of hypertension takes no antihypertensive medications and has a normal blood pressure here. Patient's neuro exam is unremarkable. She has no prior cardiac history and given patient's story and physical exam with a EKG that is unchanged from prior low suspicion for cardiac cause. Her troponin is negative and she states the pain has been constant for 4 days at this point feel that ACS would have a positive troponin. She denies productive cough or other respiratory issues and denies tobacco abuse or prior history of asthma. Low risk for PE and low suspicion.  Low suspicion for stroke at this time as well. No temporal artery tenderness concerning for temporal arteritis. Nuclear wheezes heard and no neck pain concerning for vertebral or carotid dissection.  Possible ongoing sinus infection that could be causing the headache. Also concern for possible GERD given patient recently was on antibiotics and has an atypical chest pain presentation.  CBC, BMP, troponin, CXR are all within normal limits. Head CT and sinus CT pending. UA pending  10:15 PM CT wnl. Will start pt on zantac and pt to use ocean nasal spray and zyrtec.  Gwyneth SproutWhitney Caron Tardif, MD 12/28/13 2216

## 2013-12-28 NOTE — ED Notes (Signed)
Pt transported to CT ?

## 2014-06-18 ENCOUNTER — Other Ambulatory Visit (HOSPITAL_COMMUNITY): Payer: Self-pay | Admitting: Family Medicine

## 2014-06-18 DIAGNOSIS — Z1231 Encounter for screening mammogram for malignant neoplasm of breast: Secondary | ICD-10-CM

## 2014-07-02 ENCOUNTER — Ambulatory Visit (HOSPITAL_COMMUNITY)
Admission: RE | Admit: 2014-07-02 | Discharge: 2014-07-02 | Disposition: A | Discharge status: Home / Self Care | Payer: Medicare Other | Source: Ambulatory Visit | Attending: Family Medicine | Admitting: Family Medicine

## 2014-07-02 DIAGNOSIS — Z1231 Encounter for screening mammogram for malignant neoplasm of breast: Secondary | ICD-10-CM | POA: Insufficient documentation

## 2015-04-08 ENCOUNTER — Observation Stay (HOSPITAL_COMMUNITY)
Admission: EM | Admit: 2015-04-08 | Discharge: 2015-04-12 | Disposition: A | Discharge status: Home / Self Care | Payer: Medicare Other | Attending: Internal Medicine | Admitting: Internal Medicine

## 2015-04-08 ENCOUNTER — Encounter (HOSPITAL_COMMUNITY): Payer: Self-pay | Admitting: *Deleted

## 2015-04-08 DIAGNOSIS — Z23 Encounter for immunization: Secondary | ICD-10-CM | POA: Insufficient documentation

## 2015-04-08 DIAGNOSIS — R269 Unspecified abnormalities of gait and mobility: Secondary | ICD-10-CM | POA: Diagnosis not present

## 2015-04-08 DIAGNOSIS — Z7983 Long term (current) use of bisphosphonates: Secondary | ICD-10-CM | POA: Insufficient documentation

## 2015-04-08 DIAGNOSIS — I10 Essential (primary) hypertension: Secondary | ICD-10-CM | POA: Diagnosis present

## 2015-04-08 DIAGNOSIS — K5791 Diverticulosis of intestine, part unspecified, without perforation or abscess with bleeding: Secondary | ICD-10-CM | POA: Diagnosis not present

## 2015-04-08 DIAGNOSIS — Z79899 Other long term (current) drug therapy: Secondary | ICD-10-CM | POA: Insufficient documentation

## 2015-04-08 DIAGNOSIS — M1990 Unspecified osteoarthritis, unspecified site: Secondary | ICD-10-CM | POA: Insufficient documentation

## 2015-04-08 DIAGNOSIS — K921 Melena: Secondary | ICD-10-CM | POA: Diagnosis present

## 2015-04-08 DIAGNOSIS — E785 Hyperlipidemia, unspecified: Secondary | ICD-10-CM | POA: Diagnosis not present

## 2015-04-08 DIAGNOSIS — K5790 Diverticulosis of intestine, part unspecified, without perforation or abscess without bleeding: Secondary | ICD-10-CM | POA: Diagnosis present

## 2015-04-08 DIAGNOSIS — Z87891 Personal history of nicotine dependence: Secondary | ICD-10-CM | POA: Insufficient documentation

## 2015-04-08 DIAGNOSIS — K449 Diaphragmatic hernia without obstruction or gangrene: Secondary | ICD-10-CM | POA: Diagnosis not present

## 2015-04-08 DIAGNOSIS — Z66 Do not resuscitate: Secondary | ICD-10-CM | POA: Insufficient documentation

## 2015-04-08 DIAGNOSIS — Z79891 Long term (current) use of opiate analgesic: Secondary | ICD-10-CM | POA: Diagnosis not present

## 2015-04-08 DIAGNOSIS — K5731 Diverticulosis of large intestine without perforation or abscess with bleeding: Secondary | ICD-10-CM | POA: Diagnosis not present

## 2015-04-08 DIAGNOSIS — D649 Anemia, unspecified: Secondary | ICD-10-CM | POA: Diagnosis not present

## 2015-04-08 DIAGNOSIS — K922 Gastrointestinal hemorrhage, unspecified: Secondary | ICD-10-CM | POA: Diagnosis present

## 2015-04-08 LAB — CBC
HCT: 38.8 % (ref 36.0–46.0)
HEMATOCRIT: 36.3 % (ref 36.0–46.0)
HEMOGLOBIN: 11.6 g/dL — AB (ref 12.0–15.0)
Hemoglobin: 12.3 g/dL (ref 12.0–15.0)
MCH: 28.1 pg (ref 26.0–34.0)
MCH: 28.4 pg (ref 26.0–34.0)
MCHC: 31.7 g/dL (ref 30.0–36.0)
MCHC: 32 g/dL (ref 30.0–36.0)
MCV: 88.6 fL (ref 78.0–100.0)
MCV: 89 fL (ref 78.0–100.0)
Platelets: 280 10*3/uL (ref 150–400)
Platelets: 325 10*3/uL (ref 150–400)
RBC: 4.38 MIL/uL (ref 3.87–5.11)
RBC: 4.08 MIL/uL (ref 3.87–5.11)
RDW: 15.2 % (ref 11.5–15.5)
RDW: 15.3 % (ref 11.5–15.5)
WBC: 8.3 10*3/uL (ref 4.0–10.5)
WBC: 7.6 10*3/uL (ref 4.0–10.5)

## 2015-04-08 LAB — COMPREHENSIVE METABOLIC PANEL
ALT: 14 U/L (ref 14–54)
AST: 27 U/L (ref 15–41)
Albumin: 3.4 g/dL — ABNORMAL LOW (ref 3.5–5.0)
Alkaline Phosphatase: 62 U/L (ref 38–126)
Anion gap: 10 (ref 5–15)
BILIRUBIN TOTAL: 0.7 mg/dL (ref 0.3–1.2)
BUN: 22 mg/dL — AB (ref 6–20)
CO2: 23 mmol/L (ref 22–32)
Calcium: 10 mg/dL (ref 8.9–10.3)
Chloride: 109 mmol/L (ref 101–111)
Creatinine, Ser: 0.79 mg/dL (ref 0.44–1.00)
GFR calc Af Amer: >60 mL/min (ref >60)
GFR calc non Af Amer: >60 mL/min (ref >60)
Glucose, Bld: 82 mg/dL (ref 65–99)
Potassium: 4.2 mmol/L (ref 3.5–5.1)
Sodium: 142 mmol/L (ref 135–145)
TOTAL PROTEIN: 7 g/dL (ref 6.5–8.1)

## 2015-04-08 LAB — MRSA PCR SCREENING: MRSA BY PCR: NEGATIVE

## 2015-04-08 LAB — TYPE AND SCREEN
ABO/RH(D): O POS
Antibody Screen: NEGATIVE

## 2015-04-08 LAB — POC OCCULT BLOOD, ED: FECAL OCCULT BLD: POSITIVE — AB

## 2015-04-08 MED ORDER — CALCIUM CARBONATE-VITAMIN D 500-200 MG-UNIT PO TABS
1.0000 | ORAL_TABLET | Freq: Every day | ORAL | Status: DC
  Administered 2015-04-09 – 2015-04-12 (×4): 1 via ORAL
  Filled 2015-04-08 (×5): qty 1

## 2015-04-08 MED ORDER — HYDROCODONE-ACETAMINOPHEN 5-325 MG PO TABS
1.0000 | ORAL_TABLET | Freq: Four times a day (QID) | ORAL | Status: DC | PRN
  Administered 2015-04-09 – 2015-04-10 (×3): 1 via ORAL
  Filled 2015-04-08 (×4): qty 1

## 2015-04-08 MED ORDER — ACETAMINOPHEN 325 MG PO TABS
650.0000 mg | ORAL_TABLET | Freq: Four times a day (QID) | ORAL | Status: DC | PRN
  Administered 2015-04-10 – 2015-04-11 (×2): 650 mg via ORAL
  Filled 2015-04-08 (×2): qty 2

## 2015-04-08 MED ORDER — CYCLOSPORINE 0.05 % OP EMUL
1.0000 [drp] | Freq: Every day | OPHTHALMIC | Status: DC
  Administered 2015-04-08 – 2015-04-12 (×4): 1 [drp] via OPHTHALMIC
  Filled 2015-04-08 (×5): qty 1

## 2015-04-08 MED ORDER — INFLUENZA VAC SPLIT QUAD 0.5 ML IM SUSY
0.5000 mL | PREFILLED_SYRINGE | INTRAMUSCULAR | Status: AC
  Administered 2015-04-10: 0.5 mL via INTRAMUSCULAR
  Filled 2015-04-08 (×2): qty 0.5

## 2015-04-08 MED ORDER — ONDANSETRON HCL 4 MG PO TABS
4.0000 mg | ORAL_TABLET | Freq: Four times a day (QID) | ORAL | Status: DC | PRN
Start: 2015-04-08 — End: 2015-04-12

## 2015-04-08 MED ORDER — SODIUM CHLORIDE 0.9 % IV SOLN
INTRAVENOUS | Status: DC
  Administered 2015-04-08: 19:00:00 via INTRAVENOUS
  Administered 2015-04-09: 75 mL/h via INTRAVENOUS
  Administered 2015-04-11: 500 mL via INTRAVENOUS

## 2015-04-08 MED ORDER — PANTOPRAZOLE SODIUM 40 MG IV SOLR
40.0000 mg | INTRAVENOUS | Status: DC
  Administered 2015-04-08: 40 mg via INTRAVENOUS
  Filled 2015-04-08 (×2): qty 40

## 2015-04-08 MED ORDER — ONDANSETRON HCL 4 MG/2ML IJ SOLN: 4.0000 mg | Freq: Four times a day (QID) | INTRAMUSCULAR | Status: DC | PRN

## 2015-04-08 MED ORDER — ACETAMINOPHEN 650 MG RE SUPP: 650.0000 mg | Freq: Four times a day (QID) | RECTAL | Status: DC | PRN

## 2015-04-08 MED ORDER — HYDRALAZINE HCL 20 MG/ML IJ SOLN
5.0000 mg | Freq: Once | INTRAMUSCULAR | Status: AC
  Administered 2015-04-08: 5 mg via INTRAVENOUS
  Filled 2015-04-08: qty 1

## 2015-04-08 MED ORDER — PNEUMOCOCCAL VAC POLYVALENT 25 MCG/0.5ML IJ INJ
0.5000 mL | INJECTION | INTRAMUSCULAR | Status: AC
  Administered 2015-04-10: 0.5 mL via INTRAMUSCULAR
  Filled 2015-04-08 (×2): qty 0.5

## 2015-04-08 NOTE — ED Notes (Signed)
Pt reports bright red rectal bleeding that started last night, is not on blood thinners. No acute distress noted at triage.

## 2015-04-08 NOTE — ED Notes (Signed)
Attempted report 

## 2015-04-08 NOTE — Consult Note (Signed)
Reason for Consult: Rectal bleeding. Referring Physician: [facility]-Dr. Domenic Polite.  Kimberly Rasmussen is an 81 y.o. female.  HPI: 81 year old black female with a history of diverticulosis, last colonoscopy done in 2009 and multiple medical problems listed below, was in her USOH till she started having painless rectal bleeding last night. She had multiple episodes through the night and that prompted her to come to the ER today. She denies having any dizziness, weakness or near syncope. She denies having any other GI complaints at this time. She was found to be hemodynamically stable and was admitted for observation.   Past Medical History  Diagnosis Date  . HTN (hypertension)   . Dyslipidemia   . Diverticulosis   . Arthritis   . Diverticulitis   . PONV (postoperative nausea and vomiting)     N&V when awakening from anesthesia  . Dysrhythmia     irregular heart beat  . Shortness of breath     saw cardiologist for this  . UTI (urinary tract infection)     history of UTI  . Jaundice, obstructive, intrahepatic     s/p ERCP, lithotripsy, and stone removal 01/2011, 02/2011   Past Surgical History  Procedure Laterality Date  . Total knee arthroplasty      RIGHT KNEE  . Knee surgery      LEFT KNEE  . Total abdominal hysterectomy    . Cholecystectomy      removal of gallstones x4 (?ERCP)  . Foot surgery      x2  . Ercp      with lithotripsy and gall stone removal 2012  . Total knee arthroplasty  07/13/2011    Procedure: TOTAL KNEE ARTHROPLASTY;  Surgeon: Kerin Salen;  Location: [facility];  Service: Orthopedics;  Laterality: Left;  Left Total Knee Arthroplasty with Hardware Removal     Family History  Problem Relation Age of Onset  . Pneumonia Mother   . Lung cancer Sister   . Hypertension Maternal Grandmother   . Stroke Maternal Grandmother     Social History:  reports that she quit smoking about 18 years ago. Her smoking use included Cigarettes. She has a 20 pack-year smoking  history. She has never used smokeless tobacco. She reports that she drinks about 0.6 oz of alcohol per week. She reports that she does not use illicit drugs.  Allergies:  Allergies  Allergen Reactions  . Amitriptyline Other (See Comments)    Hallucinations   . Gabapentin Other (See Comments)    Hallucinations   . Meloxicam Other (See Comments)    Hallucinations   . Tramadol Other (See Comments)    Hallucinations    Medications: I have reviewed the patient's current medications.  Results for orders placed or performed during the hospital encounter of 04/08/15 (from the past 48 hour(s))  Comprehensive metabolic panel     Status: Abnormal   Collection Time: 04/08/15 12:10 PM  Result Value Ref Range   Sodium 142 135 - 145 mmol/L   Potassium 4.2 3.5 - 5.1 mmol/L   Chloride 109 101 - 111 mmol/L   CO2 23 22 - 32 mmol/L   Glucose, Bld 82 65 - 99 mg/dL   BUN 22 (H) 6 - 20 mg/dL   Creatinine, Ser 0.79 0.44 - 1.00 mg/dL   Calcium 10.0 8.9 - 10.3 mg/dL   Total Protein 7.0 6.5 - 8.1 g/dL   Albumin 3.4 (L) 3.5 - 5.0 g/dL   AST 27 15 - 41 U/L  ALT 14 14 - 54 U/L   Alkaline Phosphatase 62 38 - 126 U/L   Total Bilirubin 0.7 0.3 - 1.2 mg/dL   GFR calc non Af Amer >60 >60 mL/min   GFR calc Af Amer >60 >60 mL/min    Comment: (NOTE) The eGFR has been calculated using the CKD EPI equation. This calculation has not been validated in all clinical situations. eGFR's persistently <60 mL/min signify possible Chronic Kidney Disease.    Anion gap 10 5 - 15  CBC     Status: None   Collection Time: 04/08/15 12:10 PM  Result Value Ref Range   WBC 8.3 4.0 - 10.5 K/uL   RBC 4.38 3.87 - 5.11 MIL/uL   Hemoglobin 12.3 12.0 - 15.0 g/dL   HCT 38.8 36.0 - 46.0 %   MCV 88.6 78.0 - 100.0 fL   MCH 28.1 26.0 - 34.0 pg   MCHC 31.7 30.0 - 36.0 g/dL   RDW 15.2 11.5 - 15.5 %   Platelets 280 150 - 400 K/uL  Type and screen     Status: None   Collection Time: 04/08/15 12:10 PM  Result Value Ref Range    ABO/RH(D) O POS    Antibody Screen NEG    Sample Expiration 04/11/2015   POC occult blood, ED     Status: Abnormal   Collection Time: 04/08/15  4:20 PM  Result Value Ref Range   Fecal Occult Bld POSITIVE (A) NEGATIVE   Review of Systems  Constitutional: Positive for malaise/fatigue. Negative for fever, chills, weight loss and diaphoresis.  HENT: Negative.   Eyes: Negative.   Respiratory: Negative.   Cardiovascular: Negative.   Gastrointestinal: Positive for blood in stool.  Musculoskeletal: Positive for back pain and joint pain.  Skin: Negative.   Neurological: Negative.  Negative for weakness.  Endo/Heme/Allergies: Negative.   Psychiatric/Behavioral: Negative.    Blood pressure 153/82, pulse 64, temperature 97.6 F (36.4 C), temperature source Oral, resp. rate 14, height 5' 5.5" (1.664 m), weight 67.949 kg (149 lb 12.8 oz), SpO2 100 %. Physical Exam  Constitutional: She is oriented to person, place, and time. She appears well-developed and well-nourished.  HENT:  Head: Normocephalic and atraumatic.  Eyes: Conjunctivae are normal. Pupils are equal, round, and reactive to light.  Neck: Normal range of motion. Neck supple.  Cardiovascular: Normal rate and regular rhythm.   Respiratory: Effort normal and breath sounds normal.  GI: Soft. Bowel sounds are normal. She exhibits no distension and no mass. There is no tenderness. There is no rebound and no guarding.  Neurological: She is oriented to person, place, and time.  Skin: Skin is warm and dry.  Psychiatric: She has a normal mood and affect. Her behavior is normal. Judgment and thought content normal.   Assessment/Plan: 1) Rectal bleeding-probably from diverticulosis. Considering her age, we will hold off on doing a colonoscopy at this time. Plans are to watch her closely for now. Continue supportive care and serial CBC's as discussed with Dr. Broadus John.  2) HTN. 3) Arthritis.  [name] 04/08/2015, 6:29 PM

## 2015-04-08 NOTE — ED Provider Notes (Signed)
CSN: [id number]     Arrival date & time 04/08/15  1014 History   First MD Initiated Contact with Patient 04/08/15 1555     Chief Complaint  Patient presents with  . Rectal Bleeding     (Consider location/radiation/quality/duration/timing/severity/associated sxs/prior Treatment) Patient is a 81 y.o. female presenting with hematochezia.  Rectal Bleeding Quality:  Bright red Amount:  Copious Duration:  16 hours Timing:  Unable to specify Progression:  Unchanged Chronicity:  Recurrent Context: defecation   Similar prior episodes: yes   Relieved by:  None tried Worsened by:  Defecation Ineffective treatments:  None tried Associated symptoms: abdominal pain and light-headedness   Associated symptoms: no fever, no loss of consciousness and no vomiting   Risk factors: no anticoagulant use     Past Medical History  Diagnosis Date  . HTN (hypertension)   . Dyslipidemia   . Diverticulosis   . Arthritis   . Diverticulitis   . PONV (postoperative nausea and vomiting)     N&V when awakening from anesthesia  . Dysrhythmia     irregular heart beat  . Shortness of breath     saw cardiologist for this  . UTI (urinary tract infection)     history of UTI  . Jaundice, obstructive, intrahepatic     s/p ERCP, lithotripsy, and stone removal 01/2011, 02/2011   Past Surgical History  Procedure Laterality Date  . Total knee arthroplasty      RIGHT KNEE  . Knee surgery      LEFT KNEE  . Total abdominal hysterectomy    . Cholecystectomy      removal of gallstones x4 (?ERCP)  . Foot surgery      x2  . Ercp      with lithotripsy and gall stone removal 2012  . Total knee arthroplasty  07/13/2011    Procedure: TOTAL KNEE ARTHROPLASTY;  Surgeon: Nestor Lewandowsky;  Location: [facility] OR;  Service: Orthopedics;  Laterality: Left;  Left Total Knee Arthroplasty with Hardware Removal    Family History  Problem Relation Age of Onset  . Pneumonia Mother   . Lung cancer Sister   . Hypertension Maternal  Grandmother   . Stroke Maternal Grandmother    Social History  Substance Use Topics  . Smoking status: Former Smoker -- 0.50 packs/day for 40 years    Types: Cigarettes    Quit date: 07/05/1996  . Smokeless tobacco: Never Used  . Alcohol Use: 0.6 oz/week    1 Glasses of wine per week     Comment: occasional   OB History    No data available     Review of Systems  Constitutional: Negative for fever and chills.  HENT: Negative for congestion and sore throat.   Eyes: Negative for visual disturbance.  Respiratory: Negative for cough, shortness of breath and wheezing.   Cardiovascular: Negative for chest pain.  Gastrointestinal: Positive for abdominal pain, blood in stool and hematochezia. Negative for nausea, vomiting, diarrhea and constipation.  Genitourinary: Negative for dysuria, difficulty urinating and vaginal pain.  Musculoskeletal: Negative for myalgias and arthralgias.  Skin: Negative for rash.  Neurological: Positive for light-headedness. Negative for loss of consciousness, syncope and headaches.  Psychiatric/Behavioral: Negative for behavioral problems.  All other systems reviewed and are negative.     Allergies  Amitriptyline; Gabapentin; Meloxicam; and Tramadol  Home Medications   Prior to Admission medications   Medication Sig Start Date End Date Taking? Authorizing Provider  alendronate (FOSAMAX) 70 MG tablet Take 70 mg  by mouth once a week. Take with a full glass of water on an empty stomach.    Historical Provider, MD  calcium-vitamin D (OSCAL WITH D) 500-200 MG-UNIT per tablet Take 1 tablet by mouth daily with breakfast.    Historical Provider, MD  celecoxib (CELEBREX) 200 MG capsule Take 200 mg by mouth 2 (two) times daily. 12/08/13   Historical Provider, MD  cetirizine (ZYRTEC ALLERGY) 10 MG tablet Take 1 tablet (10 mg total) by mouth daily. 12/28/13   Gwyneth Sprout, MD  Glucosamine-Chondroit-Vit C-Mn (GLUCOSAMINE 1500 COMPLEX PO) Take 1,500 mg by mouth  daily.    Historical Provider, MD  HYDROcodone-acetaminophen (NORCO/VICODIN) 5-325 MG per tablet Take 1 tablet by mouth every 6 (six) hours as needed for moderate pain.    Historical Provider, MD  Multiple Vitamins-Minerals (MULTIVITAMIN PO) Take 1 tablet by mouth daily.    Historical Provider, MD  polyethylene glycol (MIRALAX / GLYCOLAX) packet Take 17 g by mouth daily as needed for mild constipation.    Historical Provider, MD  Probiotic Product (RESTORA PO) Take 1 capsule by mouth daily.    Historical Provider, MD  ranitidine (ZANTAC) 150 MG tablet Take 1 tablet (150 mg total) by mouth 2 (two) times daily. 12/28/13   Gwyneth Sprout, MD  RESTASIS 0.05 % ophthalmic emulsion Place 1 drop into both eyes daily. 12/06/13   Historical Provider, MD   BP 140/57 mmHg  Pulse 78  Temp(Src) 97.6 F (36.4 C) (Oral)  Resp 18  Ht 5' 5.5" (1.664 m)  Wt 149 lb 12.8 oz (67.949 kg)  BMI 24.54 kg/m2  SpO2 100% Physical Exam  Constitutional: She is oriented to person, place, and time. She appears well-developed and well-nourished. No distress.  HENT:  Head: Normocephalic and atraumatic.  Eyes: EOM are normal.  Neck: Normal range of motion.  Cardiovascular: Normal rate, regular rhythm and normal heart sounds.   No murmur heard. Pulmonary/Chest: Effort normal and breath sounds normal. No respiratory distress. She has no wheezes.  Abdominal: Soft. There is tenderness in the right lower quadrant, suprapubic area and left lower quadrant. There is no rigidity, no rebound and no guarding.  Musculoskeletal: She exhibits no edema.  Neurological: She is alert and oriented to person, place, and time.  Skin: She is not diaphoretic.  Psychiatric: She has a normal mood and affect. Her behavior is normal.    ED Course  Procedures (including critical care time) Labs Review Labs Reviewed  COMPREHENSIVE METABOLIC PANEL - Abnormal; Notable for the following:    BUN 22 (*)    Albumin 3.4 (*)    All other components  within normal limits  POC OCCULT BLOOD, ED - Abnormal; Notable for the following:    Fecal Occult Bld POSITIVE (*)    All other components within normal limits  CBC  TYPE AND SCREEN    Imaging Review No results found. I have personally reviewed and evaluated these images and lab results as part of my medical decision-making.   EKG Interpretation None      MDM   Final diagnoses:  Gastrointestinal hemorrhage associated with intestinal diverticulosis     Patient is a 81 year old female with a known history of diverticulosis that presents with 16 hours of further blood per rectum with defecation. Patient states she has very mild bilateral lower quadrant pain that was not as severe as prior diverticulitis. On arrival to the ED the patient is afebrile with stable vital signs and no tachycardia. Patient states she has been somewhat  lightheaded with standing. On exam patient has very minimal tenderness in her bilateral lower quadrants. Patient's rectal exam was significant for gross bloody stool. Given the patient's age and her having 8-9 episodes of bloody bowel movements we will admit the patient for further evaluation and serial CBCs.  EKG is normal sinus rhythm without evidence of ischemia or arrhythmia.  Beverely Risen, MD 04/08/15 4098  Arby Barrette, MD 04/08/15 1840

## 2015-04-08 NOTE — H&P (Signed)
[facility] History and Physical  Kimberly Rasmussen ZOX:[id number] DOB: January 01, 1926 DOA: 04/08/2015  Referring physician: EDP PCP: Emeterio Reeve, MD   Chief Complaint: Blood in stool  HPI: Kimberly Rasmussen is a 81 y.o. female past medical history of hypertension, dyslipidemia, diverticulosis, diverticular bleeding presents to the ER with the above complaints. Patient reports being in her usual state of health until late last night, she had multiple episodes of bright red blood per rectum starting around midnight yesterday, patient reports having around 8-9 episodes since then, subsequently presented to the ER around 11 AM today. She hasn't had any further episodes since then. She denies any fevers or chills, reports mild lower abdominal discomfort. No vomiting. Evaluation the ER noted normal labs and vital signs, her hemoglobin is 12 and rectal exam was positive for dark red blood  Review of Systems: Positives bolded Constitutional:  No weight loss, night sweats, Fevers, chills, fatigue.  HEENT:  No headaches, Difficulty swallowing,Tooth/dental problems,Sore throat,  No sneezing, itching, ear ache, nasal congestion, post nasal drip,  Cardio-vascular:  No chest pain, Orthopnea, PND, swelling in lower extremities, anasarca, dizziness, palpitations  GI:  No heartburn, indigestion, abdominal pain, nausea, vomiting, diarrhea, change in bowel habits, loss of appetite  Resp:  No shortness of breath with exertion or at rest. No excess mucus, no productive cough, No non-productive cough, No coughing up of blood.No change in color of mucus.No wheezing.No chest wall deformity  Skin:  no rash or lesions.  GU:  no dysuria, change in color of urine, no urgency or frequency. No flank pain.  Musculoskeletal:  No joint pain or swelling. No decreased range of motion. No back pain.  Psych:  No change in mood or affect. No depression or anxiety. No memory loss.   Past Medical History    Diagnosis Date  . HTN (hypertension)   . Dyslipidemia   . Diverticulosis   . Arthritis   . Diverticulitis   . PONV (postoperative nausea and vomiting)     N&V when awakening from anesthesia  . Dysrhythmia     irregular heart beat  . Shortness of breath     saw cardiologist for this  . UTI (urinary tract infection)     history of UTI  . Jaundice, obstructive, intrahepatic     s/p ERCP, lithotripsy, and stone removal 01/2011, 02/2011   Past Surgical History  Procedure Laterality Date  . Total knee arthroplasty      RIGHT KNEE  . Knee surgery      LEFT KNEE  . Total abdominal hysterectomy    . Cholecystectomy      removal of gallstones x4 (?ERCP)  . Foot surgery      x2  . Ercp      with lithotripsy and gall stone removal 2012  . Total knee arthroplasty  07/13/2011    Procedure: TOTAL KNEE ARTHROPLASTY;  Surgeon: Nestor Lewandowsky;  Location: [facility] OR;  Service: Orthopedics;  Laterality: Left;  Left Total Knee Arthroplasty with Hardware Removal    Social History:  reports that she quit smoking about 18 years ago. Her smoking use included Cigarettes. She has a 20 pack-year smoking history. She has never used smokeless tobacco. She reports that she drinks about 0.6 oz of alcohol per week. She reports that she does not use illicit drugs.  Allergies  Allergen Reactions  . Amitriptyline Other (See Comments)    Hallucinations   . Gabapentin Other (See Comments)    Hallucinations   . Meloxicam Other (  See Comments)    Hallucinations   . Tramadol Other (See Comments)    Hallucinations     Family History  Problem Relation Age of Onset  . Pneumonia Mother   . Lung cancer Sister   . Hypertension Maternal Grandmother   . Stroke Maternal Grandmother     Prior to Admission medications   Medication Sig Start Date End Date Taking? Authorizing Provider  alendronate (FOSAMAX) 70 MG tablet Take 70 mg by mouth once a week. Take with a full glass of water on an empty stomach.    Historical  Provider, MD  calcium-vitamin D (OSCAL WITH D) 500-200 MG-UNIT per tablet Take 1 tablet by mouth daily with breakfast.    Historical Provider, MD  celecoxib (CELEBREX) 200 MG capsule Take 200 mg by mouth 2 (two) times daily. 12/08/13   Historical Provider, MD  cetirizine (ZYRTEC ALLERGY) 10 MG tablet Take 1 tablet (10 mg total) by mouth daily. 12/28/13   Gwyneth Sprout, MD  Glucosamine-Chondroit-Vit C-Mn (GLUCOSAMINE 1500 COMPLEX PO) Take 1,500 mg by mouth daily.    Historical Provider, MD  HYDROcodone-acetaminophen (NORCO/VICODIN) 5-325 MG per tablet Take 1 tablet by mouth every 6 (six) hours as needed for moderate pain.    Historical Provider, MD  Multiple Vitamins-Minerals (MULTIVITAMIN PO) Take 1 tablet by mouth daily.    Historical Provider, MD  polyethylene glycol (MIRALAX / GLYCOLAX) packet Take 17 g by mouth daily as needed for mild constipation.    Historical Provider, MD  Probiotic Product (RESTORA PO) Take 1 capsule by mouth daily.    Historical Provider, MD  ranitidine (ZANTAC) 150 MG tablet Take 1 tablet (150 mg total) by mouth 2 (two) times daily. 12/28/13   Gwyneth Sprout, MD  RESTASIS 0.05 % ophthalmic emulsion Place 1 drop into both eyes daily. 12/06/13   Historical Provider, MD   Physical Exam: Filed Vitals:   04/08/15 1700 04/08/15 1715 04/08/15 1730 04/08/15 1745  BP: 166/81 186/135 152/73 153/82  Pulse: 62 63 65 64  Temp:      TempSrc:      Resp: Height:      Weight:      SpO2: 100% 96% 100% 100%    Wt Readings from Last 3 Encounters:  04/08/15 67.949 kg (149 lb 12.8 oz)  12/28/13 72.576 kg (160 lb)  06/02/13 72.7 kg (160 lb 4.4 oz)    General:  Appears calm and comfortable, no distress, AAOx3 Eyes: PERRL, normal lids, irises & conjunctiva ENT: grossly normal hearing, lips & tongue Neck: no LAD, masses or thyromegaly Cardiovascular: RRR, no m/r/g. No LE edema. Telemetry: SR, no arrhythmias  Respiratory: CTA bilaterally, no w/r/r. Normal respiratory  effort. Abdomen: soft, Mildly distended, nontender, bowel sounds increased Skin: no rash or induration seen on limited exam Musculoskeletal: grossly normal tone BUE/BLE Psychiatric: grossly normal mood and affect, speech fluent and appropriate Neurologic: grossly non-focal.          Labs on Admission:  Basic Metabolic Panel:  Recent Labs Lab 04/08/15 1210  NA 142  K 4.2  CL 109  CO2 23  GLUCOSE 82  BUN 22*  CREATININE 0.79  CALCIUM 10.0   Liver Function Tests:  Recent Labs Lab 04/08/15 1210  AST 27  ALT 14  ALKPHOS 62  BILITOT 0.7  PROT 7.0  ALBUMIN 3.4*   No results for input(s): LIPASE, AMYLASE in the last 168 hours. No results for input(s): AMMONIA in the last 168 hours. CBC:  Recent  Labs Lab 04/08/15 1210  WBC 8.3  HGB 12.3  HCT 38.8  MCV 88.6  PLT 280   Cardiac Enzymes: No results for input(s): CKTOTAL, CKMB, CKMBINDEX, TROPONINI in the last 168 hours.  BNP (last 3 results) No results for input(s): BNP in the last 8760 hours.  ProBNP (last 3 results) No results for input(s): PROBNP in the last 8760 hours.  CBG: No results for input(s): GLUCAP in the last 168 hours.  Radiological Exams on Admission: No results found.  EKG: Independently reviewed. Normal sinus rhythm, low-voltage, no acute ST T the changes  Assessment/Plan Active Problems:  1.  Painless lower GI bleed  -Patient reports 9 episodes since last night  -Suspect diverticular , history of diverticulosis on colonoscopy in 2009  -GI Dr.Mann consulted   -Supportive care, IV fluids, monitor CBC  -If active ongoing bleeding ensues will check a bleeding scan  -Hopefully this will subside with conservative management only  -Check CBC every 8 hours, transfuse PRN  2. history of osteoarthritis  -Hold Celebrex, hydrocodone PRN  3. History of hypertension -Not on meds, BP stable, monitor  Code Status: DO NOT RESUSCITATE  Prophylaxis: SCds Family Communication: Daughter at  bedside Disposition Plan: Admit to stepdown unit  Time spent:  [facility] Pager [phone]

## 2015-04-08 NOTE — ED Notes (Signed)
attempted report 

## 2015-04-09 DIAGNOSIS — I10 Essential (primary) hypertension: Secondary | ICD-10-CM | POA: Diagnosis not present

## 2015-04-09 DIAGNOSIS — K5731 Diverticulosis of large intestine without perforation or abscess with bleeding: Secondary | ICD-10-CM | POA: Diagnosis not present

## 2015-04-09 LAB — BASIC METABOLIC PANEL
Anion gap: 7 (ref 5–15)
BUN: 17 mg/dL (ref 6–20)
CALCIUM: 9.1 mg/dL (ref 8.9–10.3)
CO2: 25 mmol/L (ref 22–32)
Chloride: 108 mmol/L (ref 101–111)
Creatinine, Ser: 0.7 mg/dL (ref 0.44–1.00)
GFR calc Af Amer: >60 mL/min (ref >60)
GLUCOSE: 69 mg/dL (ref 65–99)
Potassium: 3.5 mmol/L (ref 3.5–5.1)
Sodium: 140 mmol/L (ref 135–145)

## 2015-04-09 LAB — CBC
HCT: 31 % — ABNORMAL LOW (ref 36.0–46.0)
HCT: 34.3 % — ABNORMAL LOW (ref 36.0–46.0)
HEMATOCRIT: 34.3 % — AB (ref 36.0–46.0)
HEMOGLOBIN: 10 g/dL — AB (ref 12.0–15.0)
Hemoglobin: 11.2 g/dL — ABNORMAL LOW (ref 12.0–15.0)
Hemoglobin: 11 g/dL — ABNORMAL LOW (ref 12.0–15.0)
MCH: 28.7 pg (ref 26.0–34.0)
MCH: 28.7 pg (ref 26.0–34.0)
MCH: 28.3 pg (ref 26.0–34.0)
MCHC: 32.7 g/dL (ref 30.0–36.0)
MCHC: 32.3 g/dL (ref 30.0–36.0)
MCHC: 32.1 g/dL (ref 30.0–36.0)
MCV: 87.9 fL (ref 78.0–100.0)
MCV: 89.1 fL (ref 78.0–100.0)
MCV: 88.2 fL (ref 78.0–100.0)
PLATELETS: 324 10*3/uL (ref 150–400)
PLATELETS: 332 10*3/uL (ref 150–400)
Platelets: 290 10*3/uL (ref 150–400)
RBC: 3.9 MIL/uL (ref 3.87–5.11)
RBC: 3.48 MIL/uL — AB (ref 3.87–5.11)
RBC: 3.89 MIL/uL (ref 3.87–5.11)
RDW: 15.3 % (ref 11.5–15.5)
RDW: 15.4 % (ref 11.5–15.5)
RDW: 15.3 % (ref 11.5–15.5)
WBC: 7.1 10*3/uL (ref 4.0–10.5)
WBC: 6.5 10*3/uL (ref 4.0–10.5)
WBC: 7.2 10*3/uL (ref 4.0–10.5)

## 2015-04-09 MED ORDER — PANTOPRAZOLE SODIUM 40 MG PO TBEC
40.0000 mg | DELAYED_RELEASE_TABLET | Freq: Every day | ORAL | Status: DC
  Administered 2015-04-09 – 2015-04-10 (×2): 40 mg via ORAL
  Filled 2015-04-09 (×2): qty 1

## 2015-04-09 NOTE — Progress Notes (Signed)
[facility] PROGRESS NOTE  Kimberly Rasmussen WUJ:[id number] DOB: [birth year] DOA: 04/08/2015 PCP: Emeterio Reeve, MD     HPI: Kimberly Rasmussen is a 81 y.o. female past medical history of hypertension, dyslipidemia, diverticulosis, diverticular bleeding presented to the ER with BRBPR. Patient reports being in her usual state of health until midnight 8/12, she had multiple episodes of bright red blood per rectum starting around midnight yesterday, patient reports having around 8-9 episodes since then, subsequently presented to the ER   Assessment/Plan: 1. BRBPR -no further episodes overnight -Suspect diverticular , history of diverticulosis on colonoscopy in 2009  -GI Dr.Mann following  -Supportive care, IV fluids, monitor CBC Q12 -If active ongoing bleeding ensues will check a bleeding scan  -Hopefully this will subside with conservative management only   2. history of osteoarthritis  -Hold Celebrex, hydrocodone PRN  3. History of hypertension -Not on meds, BP stable, monitor  Code Status: Full Code Family Communication: daughter Disposition Plan: Tx to floor, Home in 1-2days   Consultants: GI  HPI/Subjective: No further bleeding  Objective: Filed Vitals:   04/09/15 1200  BP:   Pulse:   Temp: 97.6 F (36.4 C)  Resp:     Intake/Output Summary (Last 24 hours) at 04/09/15 1305 Last data filed at 04/09/15 0900  Gross per 24 hour  Intake 2128.75 ml  Output      0 ml  Net 2128.75 ml   Filed Weights   04/08/15 1157 04/08/15 1905  Weight: 67.949 kg (149 lb 12.8 oz) 67.7 kg (149 lb 4 oz)    Exam:   General:  AAOx3  Cardiovascular: S1S2/RRR  Respiratory: CTAB  Abdomen: soft, Nt, BS present  Musculoskeletal: no edema c/c   Data Reviewed: Basic Metabolic Panel:  Recent Labs Lab 04/08/15 1210 04/09/15 0120  NA 142 140  K 4.2 3.5  CL 109 108  CO2 23 25  GLUCOSE 82 69  BUN 22* 17  CREATININE 0.79 0.70  CALCIUM 10.0 9.1   Liver  Function Tests:  Recent Labs Lab 04/08/15 1210  AST 27  ALT 14  ALKPHOS 62  BILITOT 0.7  PROT 7.0  ALBUMIN 3.4*   No results for input(s): LIPASE, AMYLASE in the last 168 hours. No results for input(s): AMMONIA in the last 168 hours. CBC:  Recent Labs Lab 04/08/15 1210 04/08/15 2032 04/09/15 0120 04/09/15 0947  WBC 8.3 7.6 7.1 6.5  HGB 12.3 11.6* 11.2* 10.0*  HCT 38.8 36.3 34.3* 31.0*  MCV 88.6 89.0 87.9 89.1  PLT 280 325 324 290   Cardiac Enzymes: No results for input(s): CKTOTAL, CKMB, CKMBINDEX, TROPONINI in the last 168 hours. BNP (last 3 results) No results for input(s): BNP in the last 8760 hours.  ProBNP (last 3 results) No results for input(s): PROBNP in the last 8760 hours.  CBG: No results for input(s): GLUCAP in the last 168 hours.  Recent Results (from the past 240 hour(s))  MRSA PCR Screening     Status: None   Collection Time: 04/08/15  7:05 PM  Result Value Ref Range Status   MRSA by PCR NEGATIVE NEGATIVE Final    Comment:        The GeneXpert MRSA Assay (FDA approved for NASAL specimens only), is one component of a comprehensive MRSA colonization surveillance program. It is not intended to diagnose MRSA infection nor to guide or monitor treatment for MRSA infections.      Studies: No results found.  Scheduled Meds: . calcium-vitamin D  1 tablet Oral Q  breakfast  . cycloSPORINE  1 drop Both Eyes Daily  . Influenza vac split quadrivalent PF  0.5 mL Intramuscular Tomorrow-1000  . pantoprazole  40 mg Oral Q1200  . pneumococcal 23 valent vaccine  0.5 mL Intramuscular Tomorrow-1000   Continuous Infusions: . sodium chloride 75 mL/hr (04/09/15 0800)   Antibiotics Given (last 72 hours)    None      Active Problems:   Hypertension   GI bleed   Diverticulosis    Time spent:    [facility]  [facility] Pager [phone]. If 7PM-7AM, please contact night-coverage at [email or web address], password [facility] 04/09/2015, 1:05 PM   LOS: 1 day

## 2015-04-09 NOTE — Progress Notes (Signed)
Subjective: No complaints.  No further bleeding.  Objective: Vital signs in last 24 hours: Temp:  [97.6 F (36.4 C)-97.9 F (36.6 C)] 97.6 F (36.4 C) (09/13 1200) Pulse Rate:  [61-80] 76 (09/13 1435) Resp:  [11-30] 13 (09/13 1435) BP: (134-186)/(63-135) 154/70 mmHg (09/13 1435) SpO2:  [96 %-100 %] 100 % (09/13 1435) Weight:  [67.7 kg (149 lb 4 oz)] 67.7 kg (149 lb 4 oz) (09/12 1905) Last BM Date: 04/08/15  Intake/Output from previous day: 09/12 0701 - 09/13 0700 In: 1408.8 [P.O.:480; I.V.:928.8] Out: -  Intake/Output this shift: Total I/O In: 720 [P.O.:720] Out: -   General appearance: alert and no distress GI: soft, non-tender; bowel sounds normal; no masses,  no organomegaly  Lab Results:  Recent Labs  04/08/15 2032 04/09/15 0120 04/09/15 0947  WBC 7.6 7.1 6.5  HGB 11.6* 11.2* 10.0*  HCT 36.3 34.3* 31.0*  PLT 325 324 290   BMET  Recent Labs  04/08/15 1210 04/09/15 0120  NA 142 140  K 4.2 3.5  CL 109 108  CO2 23 25  GLUCOSE 82 69  BUN 22* 17  CREATININE 0.79 0.70  CALCIUM 10.0 9.1   LFT  Recent Labs  04/08/15 1210  PROT 7.0  ALBUMIN 3.4*  AST 27  ALT 14  ALKPHOS 62  BILITOT 0.7   PT/INR No results for input(s): LABPROT, INR in the last 72 hours. Hepatitis Panel No results for input(s): HEPBSAG, HCVAB, HEPAIGM, HEPBIGM in the last 72 hours. C-Diff No results for input(s): CDIFFTOX in the last 72 hours. Fecal Lactopherrin No results for input(s): FECLLACTOFRN in the last 72 hours.  Studies/Results: No results found.  Medications:  Scheduled: . calcium-vitamin D  1 tablet Oral Q breakfast  . cycloSPORINE  1 drop Both Eyes Daily  . Influenza vac split quadrivalent PF  0.5 mL Intramuscular Tomorrow-1000  . pantoprazole  40 mg Oral Q1200  . pneumococcal 23 valent vaccine  0.5 mL Intramuscular Tomorrow-1000   Continuous: . sodium chloride 10 mL/hr (04/09/15 1100)    Assessment/Plan: 1) Probable diverticular bleed. 2) Anemia. 3)  HTN.   Continue to monitor HGB.  If no further bleeding by tomorrow AM she can be discharged home.  The risk for a recurrent bleed is 15-25%.  Plan: 1) Monitor HGB.   LOS: 1 day   Rye Decoste D 04/09/2015, 4:54 PM

## 2015-04-09 NOTE — Care Management Note (Signed)
Case Management Note  Patient Details  Name: Kimberly Rasmussen MRN: [id number] Date of Birth: 01/03/1926  Subjective/Objective:               Admitted with GI BLEED, hx of diverticulosis.    Action/Plan: Return to home when medically stable. CM to f/u with disposition needs.  Expected Discharge Date:                  Expected Discharge Plan:  Home/Self Care  In-House Referral:     Discharge planning Services  CM Consult  Post Acute Care Choice:    Choice offered to:     DME Arranged:    DME Agency:     HH Arranged:    HH Agency:     Status of Service:  In process, will continue to follow  Medicare Important Message Given:    Date Medicare IM Given:    Medicare IM give by:    Date Additional Medicare IM Given:    Additional Medicare Important Message give by:     If discussed at Long Length of Stay Meetings, dates discussed:    Additional Comments: Othella Boyer (Daughter)  [phone]  Gae Gallop [place], [place] [phone] 04/09/2015, 12:46 PM

## 2015-04-10 DIAGNOSIS — I10 Essential (primary) hypertension: Secondary | ICD-10-CM | POA: Diagnosis not present

## 2015-04-10 DIAGNOSIS — K5791 Diverticulosis of intestine, part unspecified, without perforation or abscess with bleeding: Secondary | ICD-10-CM | POA: Diagnosis not present

## 2015-04-10 LAB — CBC
HEMATOCRIT: 32.4 % — AB (ref 36.0–46.0)
HEMATOCRIT: 31.2 % — AB (ref 36.0–46.0)
HEMOGLOBIN: 10.8 g/dL — AB (ref 12.0–15.0)
HEMOGLOBIN: 9.9 g/dL — AB (ref 12.0–15.0)
MCH: 29.3 pg (ref 26.0–34.0)
MCH: 27.9 pg (ref 26.0–34.0)
MCHC: 33.3 g/dL (ref 30.0–36.0)
MCHC: 31.7 g/dL (ref 30.0–36.0)
MCV: 88 fL (ref 78.0–100.0)
MCV: 87.9 fL (ref 78.0–100.0)
Platelets: 317 10*3/uL (ref 150–400)
Platelets: 318 10*3/uL (ref 150–400)
RBC: 3.68 MIL/uL — ABNORMAL LOW (ref 3.87–5.11)
RBC: 3.55 MIL/uL — AB (ref 3.87–5.11)
RDW: 15.1 % (ref 11.5–15.5)
RDW: 15.1 % (ref 11.5–15.5)
WBC: 7.6 10*3/uL (ref 4.0–10.5)
WBC: 6.9 10*3/uL (ref 4.0–10.5)

## 2015-04-10 LAB — BASIC METABOLIC PANEL
ANION GAP: 7 (ref 5–15)
BUN: 14 mg/dL (ref 6–20)
CO2: 23 mmol/L (ref 22–32)
Calcium: 9.1 mg/dL (ref 8.9–10.3)
Chloride: 110 mmol/L (ref 101–111)
Creatinine, Ser: 0.8 mg/dL (ref 0.44–1.00)
GFR calc Af Amer: >60 mL/min (ref >60)
GLUCOSE: 89 mg/dL (ref 65–99)
POTASSIUM: 4.2 mmol/L (ref 3.5–5.1)
Sodium: 140 mmol/L (ref 135–145)

## 2015-04-10 MED ORDER — PANTOPRAZOLE SODIUM 40 MG IV SOLR
40.0000 mg | Freq: Two times a day (BID) | INTRAVENOUS | Status: DC
  Administered 2015-04-10 – 2015-04-12 (×4): 40 mg via INTRAVENOUS
  Filled 2015-04-10 (×4): qty 40

## 2015-04-10 MED ORDER — HYDRALAZINE HCL 20 MG/ML IJ SOLN: 5.0000 mg | Freq: Four times a day (QID) | INTRAMUSCULAR | Status: DC | PRN

## 2015-04-10 NOTE — Progress Notes (Signed)
[facility] PROGRESS NOTE  Kimberly Rasmussen ZOX:[id number] DOB: 12-27-1925 DOA: 04/08/2015 PCP: Emeterio Reeve, MD     HPI: Kimberly Rasmussen is a 81 y.o. female past medical history of hypertension, dyslipidemia, diverticulosis, diverticular bleeding presented to the ER with BRBPR. Patient reports being in her usual state of health until midnight 8/12, she had multiple episodes of bright red blood per rectum starting around midnight yesterday, patient reports having around 8-9 episodes since then, subsequently presented to the ER   Assessment/Plan: 1. BRBPR -Suspect diverticular , history of diverticulosis on colonoscopy in 2009  -GI Dr Elnoria Howard following  -Supportive care, monitor hemoglobin closely, so far been stable. -Patient continues to have episodes of melena today (no further BRBPR), this is most likely due to old blood, discussed with GI, plan is for endoscopy in a.m., continue with clear liquid diet, and Protonix 40 mg IV twice a day.  2. history of osteoarthritis  -Hold Celebrex, hydrocodone PRN  3. History of hypertension -Not on meds, BP is on the higher side, will start on when necessary hydralazine.  Code Status: Full Code Family Communication: daughter Disposition Plan:  Home in 1-2days   Consultants: GI  HPI/Subjective: Patient with few episodes of melena overnight and this a.m..  Objective: Filed Vitals:   04/10/15 1026  BP: 160/69  Pulse: 81  Temp: 97.8 F (36.6 C)  Resp: 18    Intake/Output Summary (Last 24 hours) at 04/10/15 1528 Last data filed at 04/10/15 0558  Gross per 24 hour  Intake    120 ml  Output    150 ml  Net    -30 ml   Filed Weights   04/08/15 1157 04/08/15 1905 04/09/15 2109  Weight: 67.949 kg (149 lb 12.8 oz) 67.7 kg (149 lb 4 oz) 68.811 kg (151 lb 11.2 oz)    Exam:   General:  AAOx3  Cardiovascular: S1S2/RRR  Respiratory: CTAB  Abdomen: soft, Nt, BS present  Musculoskeletal: no edema c/c   Data  Reviewed: Basic Metabolic Panel:  Recent Labs Lab 04/08/15 1210 04/09/15 0120 04/10/15 0010  NA 142 140 140  K 4.2 3.5 4.2  CL 109 108 110  CO2 GLUCOSE 82 69 89  BUN 22* 17 14  CREATININE 0.79 0.70 0.80  CALCIUM 10.0 9.1 9.1   Liver Function Tests:  Recent Labs Lab 04/08/15 1210  AST 27  ALT 14  ALKPHOS 62  BILITOT 0.7  PROT 7.0  ALBUMIN 3.4*   No results for input(s): LIPASE, AMYLASE in the last 168 hours. No results for input(s): AMMONIA in the last 168 hours. CBC:  Recent Labs Lab 04/08/15 2032 04/09/15 0120 04/09/15 0947 04/09/15 1820 04/10/15 0534  WBC 7.6 7.1 6.5 7.2 7.6  HGB 11.6* 11.2* 10.0* 11.0* 10.8*  HCT 36.3 34.3* 31.0* 34.3* 32.4*  MCV 89.0 87.9 89.1 88.2 88.0  PLT 325 324 290 332 317   Cardiac Enzymes: No results for input(s): CKTOTAL, CKMB, CKMBINDEX, TROPONINI in the last 168 hours. BNP (last 3 results) No results for input(s): BNP in the last 8760 hours.  ProBNP (last 3 results) No results for input(s): PROBNP in the last 8760 hours.  CBG: No results for input(s): GLUCAP in the last 168 hours.  Recent Results (from the past 240 hour(s))  MRSA PCR Screening     Status: None   Collection Time: 04/08/15  7:05 PM  Result Value Ref Range Status   MRSA by PCR NEGATIVE NEGATIVE Final    Comment:  The GeneXpert MRSA Assay (FDA approved for NASAL specimens only), is one component of a comprehensive MRSA colonization surveillance program. It is not intended to diagnose MRSA infection nor to guide or monitor treatment for MRSA infections.      Studies: No results found.  Scheduled Meds: . calcium-vitamin D  1 tablet Oral Q breakfast  . cycloSPORINE  1 drop Both Eyes Daily  . pantoprazole (PROTONIX) IV  40 mg Intravenous Q12H   Continuous Infusions: . sodium chloride 10 mL/hr (04/09/15 1100)   Antibiotics Given (last 72 hours)    None      Active Problems:   Hypertension   GI bleed    Diverticulosis    Time spent:    [facility], [place]  [facility] Pager [phone]. If 7PM-7AM, please contact night-coverage at [email or web address], password [facility] 04/10/2015, 3:28 PM  LOS: 2 days

## 2015-04-11 ENCOUNTER — Encounter (HOSPITAL_COMMUNITY): Payer: Self-pay | Admitting: *Deleted

## 2015-04-11 ENCOUNTER — Encounter (HOSPITAL_COMMUNITY)
Admission: EM | Disposition: A | Discharge status: Home / Self Care | Payer: Self-pay | Source: Home / Self Care | Attending: Emergency Medicine

## 2015-04-11 DIAGNOSIS — I10 Essential (primary) hypertension: Secondary | ICD-10-CM | POA: Diagnosis not present

## 2015-04-11 DIAGNOSIS — K5791 Diverticulosis of intestine, part unspecified, without perforation or abscess with bleeding: Secondary | ICD-10-CM | POA: Diagnosis not present

## 2015-04-11 HISTORY — PX: ESOPHAGOGASTRODUODENOSCOPY: SHX5428

## 2015-04-11 LAB — CBC
HCT: 30 % — ABNORMAL LOW (ref 36.0–46.0)
HEMOGLOBIN: 9.6 g/dL — AB (ref 12.0–15.0)
MCH: 28.2 pg (ref 26.0–34.0)
MCHC: 32 g/dL (ref 30.0–36.0)
MCV: 88 fL (ref 78.0–100.0)
PLATELETS: 328 10*3/uL (ref 150–400)
RBC: 3.41 MIL/uL — ABNORMAL LOW (ref 3.87–5.11)
RDW: 15.3 % (ref 11.5–15.5)
WBC: 6.2 10*3/uL (ref 4.0–10.5)

## 2015-04-11 SURGERY — EGD (ESOPHAGOGASTRODUODENOSCOPY)
Anesthesia: Moderate Sedation

## 2015-04-11 MED ORDER — DIPHENHYDRAMINE HCL 50 MG/ML IJ SOLN
INTRAMUSCULAR | Status: AC
  Filled 2015-04-11: qty 1

## 2015-04-11 MED ORDER — SODIUM CHLORIDE 0.9 % IV SOLN: INTRAVENOUS | Status: DC

## 2015-04-11 MED ORDER — FENTANYL CITRATE (PF) 100 MCG/2ML IJ SOLN
INTRAMUSCULAR | Status: DC | PRN
  Administered 2015-04-11: 25 ug via INTRAVENOUS

## 2015-04-11 MED ORDER — MIDAZOLAM HCL 10 MG/2ML IJ SOLN
INTRAMUSCULAR | Status: DC | PRN
  Administered 2015-04-11: 2 mg via INTRAVENOUS

## 2015-04-11 MED ORDER — AMLODIPINE BESYLATE 5 MG PO TABS
5.0000 mg | ORAL_TABLET | Freq: Every day | ORAL | Status: DC
  Administered 2015-04-11 – 2015-04-12 (×2): 5 mg via ORAL
  Filled 2015-04-11 (×2): qty 1

## 2015-04-11 MED ORDER — FENTANYL CITRATE (PF) 100 MCG/2ML IJ SOLN
INTRAMUSCULAR | Status: AC
  Filled 2015-04-11: qty 2

## 2015-04-11 MED ORDER — MIDAZOLAM HCL 5 MG/ML IJ SOLN
INTRAMUSCULAR | Status: AC
  Filled 2015-04-11: qty 2

## 2015-04-11 NOTE — H&P (View-Only) (Signed)
[facility] PROGRESS NOTE  Kimberly Rasmussen ZOX:[id number] DOB: 12-27-1925 DOA: 04/08/2015 PCP: Emeterio Reeve, MD     HPI: Kimberly Rasmussen is a 81 y.o. female past medical history of hypertension, dyslipidemia, diverticulosis, diverticular bleeding presented to the ER with BRBPR. Patient reports being in her usual state of health until midnight 8/12, she had multiple episodes of bright red blood per rectum starting around midnight yesterday, patient reports having around 8-9 episodes since then, subsequently presented to the ER   Assessment/Plan: 1. BRBPR -Suspect diverticular , history of diverticulosis on colonoscopy in 2009  -GI Dr Elnoria Howard following  -Supportive care, monitor hemoglobin closely, so far been stable. -Patient continues to have episodes of melena today (no further BRBPR), this is most likely due to old blood, discussed with GI, plan is for endoscopy in a.m., continue with clear liquid diet, and Protonix 40 mg IV twice a day.  2. history of osteoarthritis  -Hold Celebrex, hydrocodone PRN  3. History of hypertension -Not on meds, BP is on the higher side, will start on when necessary hydralazine.  Code Status: Full Code Family Communication: daughter Disposition Plan:  Home in 1-2days   Consultants: GI  HPI/Subjective: Patient with few episodes of melena overnight and this a.m..  Objective: Filed Vitals:   04/10/15 1026  BP: 160/69  Pulse: 81  Temp: 97.8 F (36.6 C)  Resp: 18    Intake/Output Summary (Last 24 hours) at 04/10/15 1528 Last data filed at 04/10/15 0558  Gross per 24 hour  Intake    120 ml  Output    150 ml  Net    -30 ml   Filed Weights   04/08/15 1157 04/08/15 1905 04/09/15 2109  Weight: 67.949 kg (149 lb 12.8 oz) 67.7 kg (149 lb 4 oz) 68.811 kg (151 lb 11.2 oz)    Exam:   General:  AAOx3  Cardiovascular: S1S2/RRR  Respiratory: CTAB  Abdomen: soft, Nt, BS present  Musculoskeletal: no edema c/c   Data  Reviewed: Basic Metabolic Panel:  Recent Labs Lab 04/08/15 1210 04/09/15 0120 04/10/15 0010  NA 142 140 140  K 4.2 3.5 4.2  CL 109 108 110  CO2 GLUCOSE 82 69 89  BUN 22* 17 14  CREATININE 0.79 0.70 0.80  CALCIUM 10.0 9.1 9.1   Liver Function Tests:  Recent Labs Lab 04/08/15 1210  AST 27  ALT 14  ALKPHOS 62  BILITOT 0.7  PROT 7.0  ALBUMIN 3.4*   No results for input(s): LIPASE, AMYLASE in the last 168 hours. No results for input(s): AMMONIA in the last 168 hours. CBC:  Recent Labs Lab 04/08/15 2032 04/09/15 0120 04/09/15 0947 04/09/15 1820 04/10/15 0534  WBC 7.6 7.1 6.5 7.2 7.6  HGB 11.6* 11.2* 10.0* 11.0* 10.8*  HCT 36.3 34.3* 31.0* 34.3* 32.4*  MCV 89.0 87.9 89.1 88.2 88.0  PLT 325 324 290 332 317   Cardiac Enzymes: No results for input(s): CKTOTAL, CKMB, CKMBINDEX, TROPONINI in the last 168 hours. BNP (last 3 results) No results for input(s): BNP in the last 8760 hours.  ProBNP (last 3 results) No results for input(s): PROBNP in the last 8760 hours.  CBG: No results for input(s): GLUCAP in the last 168 hours.  Recent Results (from the past 240 hour(s))  MRSA PCR Screening     Status: None   Collection Time: 04/08/15  7:05 PM  Result Value Ref Range Status   MRSA by PCR NEGATIVE NEGATIVE Final    Comment:  The GeneXpert MRSA Assay (FDA approved for NASAL specimens only), is one component of a comprehensive MRSA colonization surveillance program. It is not intended to diagnose MRSA infection nor to guide or monitor treatment for MRSA infections.      Studies: No results found.  Scheduled Meds: . calcium-vitamin D  1 tablet Oral Q breakfast  . cycloSPORINE  1 drop Both Eyes Daily  . pantoprazole (PROTONIX) IV  40 mg Intravenous Q12H   Continuous Infusions: . sodium chloride 10 mL/hr (04/09/15 1100)   Antibiotics Given (last 72 hours)    None      Active Problems:   Hypertension   GI bleed    Diverticulosis    Time spent:    [facility], [place]  [facility] Pager [phone]. If 7PM-7AM, please contact night-coverage at [email or web address], password [facility] 04/10/2015, 3:28 PM  LOS: 2 days

## 2015-04-11 NOTE — Progress Notes (Signed)
[facility] PROGRESS NOTE  Kimberly Rasmussen ZOX:[id number] DOB: 10-Jan-1926 DOA: 04/08/2015 PCP: Emeterio Reeve, MD     HPI: Kimberly Rasmussen is a 81 y.o. female past medical history of hypertension, dyslipidemia, diverticulosis, diverticular bleeding presented to the ER with BRBPR. Patient reports being in her usual state of health until midnight 8/12, she had multiple episodes of bright red blood per rectum starting around midnight yesterday, patient reports having around 8-9 episodes since then, subsequently presented to the ER    Assessment/Plan:  BRBPR -Suspect diverticular , history of diverticulosis on colonoscopy in 2009  -GI Dr Elnoria Howard following  -Supportive care, monitor hemoglobin closely, so far been stable. - patient had endoscopy 04/11/1999 616 giving episodes of melena , endoscopy was significant for hiatal hernia, no other finding to suggest source of bleed , her bright red blood per rectum most likely related to her diverticulosis as evident on previous colonoscopy 2009 ,  given her age and comorbidities colonoscopy will not be pursued at this time as per GI recommendation , treated with conservative management, recheck CBC in a.m. Marland Kitchen  history of osteoarthritis  -Hold Celebrex, hydrocodone PRN  History of hypertension -Not on meds, BP is on the higher side, will start on Norvasc , continue  on when necessary hydralazine.  Code Status: Full Code Family Communication: daughterAt bedside  Disposition Plan:  Home in  24 hours if hemoglobin remained stable  Consultants: GI  Procedures  - EGD 04/11/1115 by Dr. Elnoria Howard   HPI/Subject Agent reports no bowel movement today, reports last bowel movement yesterday evening, color is more brown than dark per patient..  Objective: Filed Vitals:   04/11/15 1320  BP: 151/65  Pulse: 70  Temp:   Resp: 21    Intake/Output Summary (Last 24 hours) at 04/11/15 1534 Last data filed at 04/11/15 1444  Gross per 24 hour   Intake      0 ml  Output    800 ml  Net   -800 ml   Filed Weights   04/08/15 1905 04/09/15 2109 04/10/15 2113  Weight: 67.7 kg (149 lb 4 oz) 68.811 kg (151 lb 11.2 oz) 69.718 kg (153 lb 11.2 oz)    Exam:   General:  AAOx3  Cardiovascular: S1S2/RRR  Respiratory: CTAB  Abdomen: soft, Nt, BS present  Musculoskeletal: no edema c/c   Data Reviewed: Basic Metabolic Panel:  Recent Labs Lab 04/08/15 1210 04/09/15 0120 04/10/15 0010  NA 142 140 140  K 4.2 3.5 4.2  CL 109 108 110  CO2 23 25 23   GLUCOSE 82 69 89  BUN 22* 17 14  CREATININE 0.79 0.70 0.80  CALCIUM 10.0 9.1 9.1   Liver Function Tests:  Recent Labs Lab 04/08/15 1210  AST 27  ALT 14  ALKPHOS 62  BILITOT 0.7  PROT 7.0  ALBUMIN 3.4*   No results for input(s): LIPASE, AMYLASE in the last 168 hours. No results for input(s): AMMONIA in the last 168 hours. CBC:  Recent Labs Lab 04/09/15 0947 04/09/15 1820 04/10/15 0534 04/10/15 1734 04/11/15 0658  WBC 6.5 7.2 7.6 6.9 6.2  HGB 10.0* 11.0* 10.8* 9.9* 9.6*  HCT 31.0* 34.3* 32.4* 31.2* 30.0*  MCV 89.1 88.2 88.0 87.9 88.0  PLT 290 332 317 318 328   Cardiac Enzymes: No results for input(s): CKTOTAL, CKMB, CKMBINDEX, TROPONINI in the last 168 hours. BNP (last 3 results) No results for input(s): BNP in the last 8760 hours.  ProBNP (last 3 results) No results for input(s): PROBNP in  the last 8760 hours.  CBG: No results for input(s): GLUCAP in the last 168 hours.  Recent Results (from the past 240 hour(s))  MRSA PCR Screening     Status: None   Collection Time: 04/08/15  7:05 PM  Result Value Ref Range Status   MRSA by PCR NEGATIVE NEGATIVE Final    Comment:        The GeneXpert MRSA Assay (FDA approved for NASAL specimens only), is one component of a comprehensive MRSA colonization surveillance program. It is not intended to diagnose MRSA infection nor to guide or monitor treatment for MRSA infections.      Studies: No results  found.  Scheduled Meds: . calcium-vitamin D  1 tablet Oral Q breakfast  . cycloSPORINE  1 drop Both Eyes Daily  . pantoprazole (PROTONIX) IV  40 mg Intravenous Q12H   Continuous Infusions: . sodium chloride 500 mL (04/11/15 1138)   Antibiotics Given (last 72 hours)    None      Active Problems:   Hypertension   GI bleed   Diverticulosis    Time spent:    [facility], [place]  [facility] Pager [phone]. If 7PM-7AM, please contact night-coverage at [email or web address], password [facility] 04/11/2015, 3:34 PM  LOS: 3 days

## 2015-04-11 NOTE — Interval H&P Note (Signed)
History and Physical Interval Note:  04/11/2015 12:42 PM  Kimberly Rasmussen  has presented today for surgery, with the diagnosis of Melena  The various methods of treatment have been discussed with the patient and family. After consideration of risks, benefits and other options for treatment, the patient has consented to  Procedure(s): ESOPHAGOGASTRODUODENOSCOPY (EGD) (N/A) as a surgical intervention .  The patient's history has been reviewed, patient examined, no change in status, stable for surgery.  I have reviewed the patient's chart and labs.  Questions were answered to the patient's satisfaction.     Haldon Carley D

## 2015-04-11 NOTE — Evaluation (Signed)
Physical Therapy Evaluation Patient Details Name: Kimberly Rasmussen MRN: [id number] DOB: 01/10/1926 Today's Date: 04/11/2015   History of Present Illness  Patient is an 81 yo female admitted 04/08/15 with fatigue and blood in stool.  Patient with diverticulosis, GIB.  PMH:  HTN, diverticulosis, arthritis.  Clinical Impression  Patient presents with problems listed below.  Will benefit from acute PT to maximize functional independence prior to returning home.    Follow Up Recommendations No PT follow up;Supervision - Intermittent    Equipment Recommendations  None recommended by PT    Recommendations for Other Services       Precautions / Restrictions Precautions Precautions: Fall Restrictions Weight Bearing Restrictions: No      Mobility  Bed Mobility               General bed mobility comments: Patient in chair as PT entered room.  Transfers Overall transfer level: Needs assistance Equipment used: None Transfers: Sit to/from Stand Sit to Stand: Min guard         General transfer comment: Cues to scoot to edge of chair (patient sitting on low chair/couch).  Required increased time to move to standing from low surface.  Assist for safety/balance.  Ambulation/Gait Ambulation/Gait assistance: Min guard Ambulation Distance (Feet): 30 Feet Assistive device: None Gait Pattern/deviations: Step-through pattern;Decreased stride length     General Gait Details: Patient with slow steady gait with no assistive device.  No loss of balance during short distance gait. (Session ended early due to transport to Endo).  Stairs            Wheelchair Mobility    Modified Rankin (Stroke Patients Only)       Balance Overall balance assessment: Needs assistance         Standing balance support: No upper extremity supported Standing balance-Leahy Scale: Good                               Pertinent Vitals/Pain Pain Assessment: No/denies pain     Home Living Family/patient expects to be discharged to:: Private residence Living Arrangements: Alone Available Help at Discharge: Family;Available PRN/intermittently Type of Home: House (Townhouse) Home Access: Level entry     Home Layout: Two level;Able to live on main level with bedroom/bathroom Home Equipment: Dan Humphreys - 2 wheels;Cane - single point;Shower seat - built in;Grab bars - tub/shower      Prior Function Level of Independence: Independent with assistive device(s)         Comments: Uses cane occasionally.     Hand Dominance        Extremity/Trunk Assessment   Upper Extremity Assessment: Overall WFL for tasks assessed           Lower Extremity Assessment: Generalized weakness         Communication   Communication: HOH (Hearing Aid Rt ear)  Cognition Arousal/Alertness: Awake/alert Behavior During Therapy: WFL for tasks assessed/performed Overall Cognitive Status: Within Functional Limits for tasks assessed                      General Comments      Exercises        Assessment/Plan    PT Assessment Patient needs continued PT services  PT Diagnosis Abnormality of gait;Generalized weakness   PT Problem List Decreased strength;Decreased activity tolerance;Decreased mobility  PT Treatment Interventions Gait training;DME instruction;Functional mobility training;Therapeutic activities;Patient/family education   PT Goals (Current goals can be  found in the Care Plan section) Acute Rehab PT Goals Patient Stated Goal: To go home soon PT Goal Formulation: With patient/family Time For Goal Achievement: 04/18/15 Potential to Achieve Goals: Good    Frequency Min 3X/week   Barriers to discharge Decreased caregiver support Patient lives alone    Co-evaluation               End of Session Equipment Utilized During Treatment: Gait belt Activity Tolerance: Patient tolerated treatment well Patient left: in chair (in w/c for  transport to Endo) Nurse Communication: Mobility status    Functional Assessment Tool Used: Clinical judgement Functional Limitation: Mobility: Walking and moving around Mobility: Walking and Moving Around Current Status (Z6109): At least 1 percent but less than 20 percent impaired, limited or restricted Mobility: Walking and Moving Around Goal Status [phone]): 0 percent impaired, limited or restricted    Time: 0981-1914 and 15:41-15:52 PT Time Calculation (min) (ACUTE ONLY): 4 min + 11 min = 15 min total   Charges:   PT Evaluation $Initial PT Evaluation Tier I: 1 Procedure     PT G Codes:   PT G-Codes **NOT FOR INPATIENT CLASS** Functional Assessment Tool Used: Clinical judgement Functional Limitation: Mobility: Walking and moving around Mobility: Walking and Moving Around Current Status (N8295): At least 1 percent but less than 20 percent impaired, limited or restricted Mobility: Walking and Moving Around Goal Status [phone]): 0 percent impaired, limited or restricted    Vena Austria 04/11/2015, 7:15 PM Durenda Hurt. Renaldo Fiddler, [facility] Acute Rehab Services Pager [phone]

## 2015-04-11 NOTE — Op Note (Signed)
[facility] [address] [place], [address]   ENDOSCOPY PROCEDURE REPORT  PATIENT: Kimberly, Rasmussen  MR#: [id number] BIRTHDATE: 07-07-1926 , 89  yrs. old GENDER: female ENDOSCOPIST:Mikia Delaluz Elnoria Howard, MD REFERRED BY: PROCEDURE DATE:  30-Apr-2015 PROCEDURE:   EGD, diagnostic ASA CLASS:    Class III INDICATIONS:  Melena and anemia MEDICATION: Fentanyl 25 mcg IV and Versed 2 mg IV TOPICAL ANESTHETIC:   none  DESCRIPTION OF PROCEDURE:   After the risks and benefits of the procedure were explained, informed consent was obtained.  The Pentax Gastroscope [id number]  endoscope was introduced through the mouth  and advanced to the second portion of the duodenum .  The instrument was slowly withdrawn as the mucosa was fully examined. Estimated blood loss is zero unless otherwise noted in this procedure report.   FINDINGS: The esophagus was normal.  The Z-line was sharp in the setting of a 4 cm hiatal hernia.  The gastric lumen and the duodenal lumen were normal, i.e., no evidence of any inflammation, ulcerations, erosions, polyps, masses, or vascular abnormalities. Retroflexed views revealed no abnormalities.    The scope was then withdrawn from the patient and the procedure completed.  COMPLICATIONS: There were no immediate complications.  ENDOSCOPIC IMPRESSION: 1) Hiatal hernia.  RECOMMENDATIONS: 1) Follow HGB and transfuse as necessary.  Presumed diverticular bleed.  Because of her age and comorbidities, a repeat colonoscopy is not being pursued at this time.   _______________________________ eSignedJeani Hawking, MD April 30, 2015 1:13 PM     cc:  CPT CODES: ICD CODES:  The ICD and CPT codes recommended by this software are interpretations from the data that the clinical staff has captured with the software.  The verification of the translation of this report to the ICD and CPT codes and modifiers is the sole responsibility of the health care  institution and practicing physician where this report was generated.  [employer] will not be held responsible for the validity of the ICD and CPT codes included on this report.  AMA assumes no liability for data contained or not contained herein. CPT is a Publishing rights manager of the [employer].

## 2015-04-12 ENCOUNTER — Encounter (HOSPITAL_COMMUNITY): Payer: Self-pay | Admitting: Gastroenterology

## 2015-04-12 DIAGNOSIS — K5791 Diverticulosis of intestine, part unspecified, without perforation or abscess with bleeding: Secondary | ICD-10-CM | POA: Diagnosis not present

## 2015-04-12 DIAGNOSIS — I10 Essential (primary) hypertension: Secondary | ICD-10-CM | POA: Diagnosis not present

## 2015-04-12 LAB — CBC
HCT: 29.5 % — ABNORMAL LOW (ref 36.0–46.0)
Hemoglobin: 9.7 g/dL — ABNORMAL LOW (ref 12.0–15.0)
MCH: 29.2 pg (ref 26.0–34.0)
MCHC: 32.9 g/dL (ref 30.0–36.0)
MCV: 88.9 fL (ref 78.0–100.0)
PLATELETS: 329 10*3/uL (ref 150–400)
RBC: 3.32 MIL/uL — AB (ref 3.87–5.11)
RDW: 15.2 % (ref 11.5–15.5)
WBC: 7.2 10*3/uL (ref 4.0–10.5)

## 2015-04-12 MED ORDER — PANTOPRAZOLE SODIUM 40 MG PO TBEC: 40.0000 mg | DELAYED_RELEASE_TABLET | Freq: Every day | ORAL | Status: DC

## 2015-04-12 MED ORDER — CELECOXIB 200 MG PO CAPS: 200.0000 mg | ORAL_CAPSULE | Freq: Every day | ORAL | Status: DC

## 2015-04-12 MED ORDER — AMLODIPINE BESYLATE 5 MG PO TABS
5.0000 mg | ORAL_TABLET | Freq: Every day | ORAL | Status: DC
Start: 2015-04-12 — End: 2018-09-19

## 2015-04-12 NOTE — Progress Notes (Signed)
Physical Therapy Treatment Patient Details Name: Kimberly Rasmussen MRN: [id number] DOB: 10-23-1925 Today's Date: 04/12/2015    History of Present Illness Patient is an 81 yo female admitted 04/08/15 with fatigue and blood in stool.  Patient with diverticulosis, GIB.  PMH:  HTN, diverticulosis, arthritis.    PT Comments    Patient has achieved all goals for mobility and gait.  Patient to discharge home today with assist of daughter.  No further PT needs identified - patient at baseline functional level.  Follow Up Recommendations  No PT follow up;Supervision - Intermittent     Equipment Recommendations  None recommended by PT    Recommendations for Other Services       Precautions / Restrictions Precautions Precautions: Fall Restrictions Weight Bearing Restrictions: No    Mobility  Bed Mobility               General bed mobility comments: Patient sitting EOB as PT entered room.  Transfers Overall transfer level: Modified independent Equipment used: None Transfers: Sit to/from Stand Sit to Stand: Modified independent (Device/Increase time)            Ambulation/Gait Ambulation/Gait assistance: Modified independent (Device/Increase time) Ambulation Distance (Feet): 300 Feet Assistive device: None Gait Pattern/deviations: Step-through pattern;Decreased stride length   Gait velocity interpretation: at or above normal speed for age/gender General Gait Details: Patient with good gait pattern and speed today.  Good balance during gait with no assistive device.   Stairs            Wheelchair Mobility    Modified Rankin (Stroke Patients Only)       Balance           Standing balance support: No upper extremity supported Standing balance-Leahy Scale: Good                      Cognition Arousal/Alertness: Awake/alert Behavior During Therapy: WFL for tasks assessed/performed Overall Cognitive Status: Within Functional Limits for tasks  assessed                      Exercises      General Comments        Pertinent Vitals/Pain Pain Assessment: No/denies pain    Home Living                      Prior Function            PT Goals (current goals can now be found in the care plan section) Progress towards PT goals: Progressing toward goals    Frequency  Min 3X/week    PT Plan Current plan remains appropriate    Co-evaluation             End of Session   Activity Tolerance: Patient tolerated treatment well Patient left: in bed;with call bell/phone within reach (sitting EOB)     Time: 4098-1191 PT Time Calculation (min) (ACUTE ONLY): 10 min  Charges:  $Gait Training: 8-22 mins                    G Codes:  Functional Assessment Tool Used: Clinical judgement Functional Limitation: Mobility: Walking and moving around Mobility: Walking and Moving Around Goal Status [phone]): 0 percent impaired, limited or restricted Mobility: Walking and Moving Around Discharge Status [phone]): 0 percent impaired, limited or restricted   Vena Austria 04/12/2015, 12:09 PM Durenda Hurt. Renaldo Fiddler, [facility] Acute Rehab Services Pager [phone]

## 2015-04-12 NOTE — Care Management Note (Signed)
Case Management Note  Patient Details  Name: Amelie Caracci MRN: [id number] Date of Birth: 09/04/1925  Subjective/Objective:              CM following for progression and d/c planning.      Action/Plan: No HH needs identified.   Expected Discharge Date:       04/12/2015           Expected Discharge Plan:  Home/Self Care  In-House Referral:     Discharge planning Services  CM Consult  Post Acute Care Choice:  NA Choice offered to:  NA  DME Arranged:    DME Agency:     HH Arranged:    HH Agency:     Status of Service:  Completed, signed off  Medicare Important Message Given:    Date Medicare IM Given:    Medicare IM give by:    Date Additional Medicare IM Given:    Additional Medicare Important Message give by:     If discussed at Long Length of Stay Meetings, dates discussed:    Additional Comments:  Starlyn Skeans, RN 04/12/2015, 4:34 PM

## 2015-04-12 NOTE — Discharge Summary (Signed)
Kimberly Rasmussen, is a 81 y.o. female  DOB 04/27/1926  MRN [id number].  Admission date:  04/08/2015  Admitting Physician  Rodolph Bong, MD  Discharge Date:  04/12/2015   Primary MD  Emeterio Reeve, MD  Recommendations for primary care physician for things to follow:  - Please check CBC during next visit recommended in 4-5 days. - Blood pressure uncontrolled during hospital stay, dark on low-dose Norvasc, reassess.  Admission Diagnosis  Gastrointestinal hemorrhage associated with intestinal diverticulosis [K57.91]   Discharge Diagnosis  Gastrointestinal hemorrhage associated with intestinal diverticulosis [K57.91]    Active Problems:   Hypertension   GI bleed   Diverticulosis      Past Medical History  Diagnosis Date  . HTN (hypertension)   . Dyslipidemia   . Diverticulosis   . Arthritis   . Diverticulitis   . PONV (postoperative nausea and vomiting)     N&V when awakening from anesthesia  . Dysrhythmia     irregular heart beat  . Shortness of breath     saw cardiologist for this  . UTI (urinary tract infection)     history of UTI  . Jaundice, obstructive, intrahepatic     s/p ERCP, lithotripsy, and stone removal 01/2011, 02/2011    Past Surgical History  Procedure Laterality Date  . Total knee arthroplasty      RIGHT KNEE  . Knee surgery      LEFT KNEE  . Total abdominal hysterectomy    . Cholecystectomy      removal of gallstones x4 (?ERCP)  . Foot surgery      x2  . Ercp      with lithotripsy and gall stone removal 2012  . Total knee arthroplasty  07/13/2011    Procedure: TOTAL KNEE ARTHROPLASTY;  Surgeon: Nestor Lewandowsky;  Location: [facility] OR;  Service: Orthopedics;  Laterality: Left;  Left Total Knee Arthroplasty with Hardware Removal        History of present illness and  Hospital Course:     Kindly see H&P for history of present illness and admission details,  please review complete Labs, Consult reports and Test reports for all details in brief  HPI  from the history and physical done on the day of admission  Kimberly Rasmussen is a 81 y.o. female past medical history of hypertension, dyslipidemia, diverticulosis, diverticular bleeding presents to the ER with the above complaints. Patient reports being in her usual state of health until late last night, she had multiple episodes of bright red blood per rectum starting around midnight yesterday, patient reports having around 8-9 episodes since then, subsequently presented to the ER around 11 AM today. She hasn't had any further episodes since then. She denies any fevers or chills, reports mild lower abdominal discomfort. No vomiting. Evaluation the ER noted normal labs and vital signs, her hemoglobin is 12 and rectal exam was positive for dark red blood   Hospital Course   BRB[facility] -Suspect diverticular , history of diverticulosis on colonoscopy in 2009  -Seen by  GI Dr. Elnoria Howard. -Supportive care, monitor hemoglobin closely, so far been stable. - patient had endoscopy 04/11/2015 giving episodes of melena , endoscopy was significant for hiatal hernia, no other finding to suggest source of bleed , her bright red blood per rectum most likely related to her diverticulosis as evident on previous colonoscopy 2009 , given her age and comorbidities colonoscopy will not be pursued at this time as per GI recommendation , treated with conservative management. - Patient with no further episodes of bright red blood per rectum, no melena, hemoglobin has been stable over the last 72 hours.  history of osteoarthritis  - on Celebrex and hydrocodone, can resume Celebrex in 1 week as no evidence of upper GI bleed or gastritis.  History of hypertension -  on any meds at home, blood pressure was uncontrolled during hospital stay, started on Norvasc, much better controlled now .   Discharge Condition:  stable   Follow  UP  Follow-up Information    Follow up with [name] A, MD. Schedule an appointment as soon as possible for a visit in 1 week.   Specialty:  Family Medicine   Why:  Posthospitalization follow-up   Contact information:   [address] [place] [address] [phone]         Discharge Instructions  and  Discharge Medications         Discharge Instructions    Diet - low sodium heart healthy    Complete by:  As directed      Discharge instructions    Complete by:  As directed   Follow with Primary MD Emeterio Reeve, MD in 4-5 days   Get CBC, CMP,  checked  by Primary MD next visit.    Activity: As tolerated with Full fall precautions use walker/cane & assistance as needed   Disposition Home    Diet: Heart Healthy  , with feeding assistance and aspiration precautions.  For Heart failure patients - Check your Weight same time everyday, if you gain over 2 pounds, or you develop in leg swelling, experience more shortness of breath or chest pain, call your Primary MD immediately. Follow Cardiac Low Salt Diet and 1.5 lit/day fluid restriction.   On your next visit with your primary care physician please Get Medicines reviewed and adjusted.   Please request your Prim.MD to go over all Hospital Tests and Procedure/Radiological results at the follow up, please get all Hospital records sent to your Prim MD by signing hospital release before you go home.   If you experience worsening of your admission symptoms, develop shortness of breath, life threatening emergency, suicidal or homicidal thoughts you must seek medical attention immediately by calling 911 or calling your MD immediately  if symptoms less severe.  You Must read complete instructions/literature along with all the possible adverse reactions/side effects for all the Medicines you take and that have been prescribed to you. Take any new Medicines after you have completely understood and accpet  all the possible adverse reactions/side effects.   Do not drive, operating heavy machinery, perform activities at heights, swimming or participation in water activities or provide baby sitting services if your were admitted for syncope or siezures until you have seen by Primary MD or a Neurologist and advised to do so again.  Do not drive when taking Pain medications.    Do not take more than prescribed Pain, Sleep and Anxiety Medications  Special Instructions: If you have smoked or chewed Tobacco  in the  last 2 yrs please stop smoking, stop any regular Alcohol  and or any Recreational drug use.  Wear Seat belts while driving.   Please note  You were cared for by a hospitalist during your hospital stay. If you have any questions about your discharge medications or the care you received while you were in the hospital after you are discharged, you can call the unit and asked to speak with the hospitalist on call if the hospitalist that took care of you is not available. Once you are discharged, your primary care physician will handle any further medical issues. Please note that NO REFILLS for any discharge medications will be authorized once you are discharged, as it is imperative that you return to your primary care physician (or establish a relationship with a primary care physician if you do not have one) for your aftercare needs so that they can reassess your need for medications and monitor your lab values.     Increase activity slowly    Complete by:  As directed             Medication List    TAKE these medications        alendronate 70 MG tablet  Commonly known as:  FOSAMAX  Take 70 mg by mouth once a week. Take with a full glass of water on an empty stomach. Take on monday's     amLODipine 5 MG tablet  Commonly known as:  NORVASC  Take 1 tablet (5 mg total) by mouth daily.     calcium-vitamin D 500-200 MG-UNIT per tablet  Commonly known as:  OSCAL WITH D  Take 1 tablet by  mouth daily with breakfast.     celecoxib 200 MG capsule  Commonly known as:  CELEBREX  Take 1 capsule (200 mg total) by mouth daily.  Start taking on:  04/18/2015     cetirizine 10 MG tablet  Commonly known as:  ZYRTEC ALLERGY  Take 1 tablet (10 mg total) by mouth daily.     FLONASE 50 MCG/ACT nasal spray  Generic drug:  fluticasone  Place 1 spray into both nostrils daily.     GLUCOSAMINE 1500 COMPLEX PO  Take 1,500 mg by mouth daily.     HYDROcodone-acetaminophen 5-325 MG per tablet  Commonly known as:  NORCO/VICODIN  Take 1 tablet by mouth every 6 (six) hours as needed for moderate pain.     MULTIVITAMIN PO  Take 1 tablet by mouth daily.     pantoprazole 40 MG tablet  Commonly known as:  PROTONIX  Take 1 tablet (40 mg total) by mouth daily.     polyethylene glycol packet  Commonly known as:  MIRALAX / GLYCOLAX  Take 17 g by mouth daily as needed for mild constipation.     ranitidine 150 MG tablet  Commonly known as:  ZANTAC  Take 1 tablet (150 mg total) by mouth 2 (two) times daily.     RESTASIS 0.05 % ophthalmic emulsion  Generic drug:  cycloSPORINE  Place 1 drop into both eyes daily.          Diet and Activity recommendation: See Discharge Instructions above   Consults obtained -  Gastroenterology Dr. Elnoria Howard   Major procedures and Radiology Reports - PLEASE review detailed and final reports for all details, in brief -   EGD 9/15 sagittal and 16 significant only for hiatal hernia, no evidence or source of upper GI bleed  No results found.  Micro Results  Recent Results (from the past 240 hour(s))  MRSA PCR Screening     Status: None   Collection Time: 04/08/15  7:05 PM  Result Value Ref Range Status   MRSA by PCR NEGATIVE NEGATIVE Final    Comment:        The GeneXpert MRSA Assay (FDA approved for NASAL specimens only), is one component of a comprehensive MRSA colonization surveillance program. It is not intended to diagnose  MRSA infection nor to guide or monitor treatment for MRSA infections.        Today   Subjective:   Shavona Gunderman today has no headache,no chest abdominal pain,no new weakness tingling or numbness, feels much better  today. Patient denies any bowel movement over last 24 hours, and before that was more brown, no dark colored stools.  Objective:   Blood pressure 118/55, pulse 85, temperature 98.6 F (37 C), temperature source Oral, resp. rate 17, height 5\' 5"  (1.651 m), weight 68.539 kg (151 lb 1.6 oz), SpO2 100 %.   Intake/Output Summary (Last 24 hours) at 04/12/15 1111 Last data filed at 04/12/15 0909  Gross per 24 hour  Intake    480 ml  Output    300 ml  Net    180 ml    Exam Awake Alert, Oriented x 3, No new F.N deficits, Normal affect [place].AT,PERRAL Supple Neck,No JVD, No cervical lymphadenopathy appriciated.  Symmetrical Chest wall movement, Good air movement bilaterally, CTAB RRR,No Gallops,Rubs or new Murmurs, No Parasternal Heave +ve B.Sounds, Abd Soft, Non tender, No organomegaly appriciated, No rebound -guarding or rigidity. No Cyanosis, Clubbing or edema, No new Rash or bruise  Data Review   CBC w Diff:  Lab Results  Component Value Date   WBC 7.2 04/12/2015   HGB 9.7* 04/12/2015   HCT 29.5* 04/12/2015   PLT 329 04/12/2015   LYMPHOPCT 22 06/01/2013   BANDSPCT 29* 01/05/2011   MONOPCT 10 06/01/2013   EOSPCT 2 06/01/2013   BASOPCT 0 06/01/2013    CMP:  Lab Results  Component Value Date   NA 140 04/10/2015   K 4.2 04/10/2015   CL 110 04/10/2015   CO2 23 04/10/2015   BUN 14 04/10/2015   CREATININE 0.80 04/10/2015   PROT 7.0 04/08/2015   ALBUMIN 3.4* 04/08/2015   BILITOT 0.7 04/08/2015   ALKPHOS 62 04/08/2015   AST 27 04/08/2015   ALT 14 04/08/2015  .   Total Time in preparing paper work, data evaluation and todays exam - 35 minutes  ELGERGAWY, DAWOOD M.D on 04/12/2015 at 11:11 AM  [facility]   Office  [phone]

## 2015-04-12 NOTE — Discharge Instructions (Signed)
Follow with Primary MD Emeterio Reeve, MD in 4-5 days   Get CBC, CMP,  checked  by Primary MD next visit.    Activity: As tolerated with Full fall precautions use walker/cane & assistance as needed   Disposition Home    Diet: Heart Healthy  , with feeding assistance and aspiration precautions.  For Heart failure patients - Check your Weight same time everyday, if you gain over 2 pounds, or you develop in leg swelling, experience more shortness of breath or chest pain, call your Primary MD immediately. Follow Cardiac Low Salt Diet and 1.5 lit/day fluid restriction.   On your next visit with your primary care physician please Get Medicines reviewed and adjusted.   Please request your Prim.MD to go over all Hospital Tests and Procedure/Radiological results at the follow up, please get all Hospital records sent to your Prim MD by signing hospital release before you go home.   If you experience worsening of your admission symptoms, develop shortness of breath, life threatening emergency, suicidal or homicidal thoughts you must seek medical attention immediately by calling 911 or calling your MD immediately  if symptoms less severe.  You Must read complete instructions/literature along with all the possible adverse reactions/side effects for all the Medicines you take and that have been prescribed to you. Take any new Medicines after you have completely understood and accpet all the possible adverse reactions/side effects.   Do not drive, operating heavy machinery, perform activities at heights, swimming or participation in water activities or provide baby sitting services if your were admitted for syncope or siezures until you have seen by Primary MD or a Neurologist and advised to do so again.  Do not drive when taking Pain medications.    Do not take more than prescribed Pain, Sleep and Anxiety Medications  Special Instructions: If you have smoked or chewed Tobacco  in the last 2 yrs  please stop smoking, stop any regular Alcohol  and or any Recreational drug use.  Wear Seat belts while driving.   Please note  You were cared for by a hospitalist during your hospital stay. If you have any questions about your discharge medications or the care you received while you were in the hospital after you are discharged, you can call the unit and asked to speak with the hospitalist on call if the hospitalist that took care of you is not available. Once you are discharged, your primary care physician will handle any further medical issues. Please note that NO REFILLS for any discharge medications will be authorized once you are discharged, as it is imperative that you return to your primary care physician (or establish a relationship with a primary care physician if you do not have one) for your aftercare needs so that they can reassess your need for medications and monitor your lab values.

## 2016-05-15 ENCOUNTER — Encounter: Payer: Self-pay | Admitting: Cardiovascular Disease

## 2017-05-12 ENCOUNTER — Emergency Department (HOSPITAL_COMMUNITY): Payer: Medicare Other

## 2017-05-12 ENCOUNTER — Encounter (HOSPITAL_COMMUNITY): Payer: Self-pay | Admitting: Emergency Medicine

## 2017-05-12 ENCOUNTER — Emergency Department (HOSPITAL_COMMUNITY)
Admission: EM | Admit: 2017-05-12 | Discharge: 2017-05-12 | Disposition: A | Discharge status: Home / Self Care | Payer: Medicare Other | Attending: Emergency Medicine | Admitting: Emergency Medicine

## 2017-05-12 DIAGNOSIS — Z96653 Presence of artificial knee joint, bilateral: Secondary | ICD-10-CM | POA: Insufficient documentation

## 2017-05-12 DIAGNOSIS — M25511 Pain in right shoulder: Secondary | ICD-10-CM | POA: Diagnosis present

## 2017-05-12 DIAGNOSIS — Z79899 Other long term (current) drug therapy: Secondary | ICD-10-CM | POA: Diagnosis not present

## 2017-05-12 DIAGNOSIS — Z87891 Personal history of nicotine dependence: Secondary | ICD-10-CM | POA: Diagnosis not present

## 2017-05-12 DIAGNOSIS — M19012 Primary osteoarthritis, left shoulder: Secondary | ICD-10-CM | POA: Diagnosis not present

## 2017-05-12 DIAGNOSIS — M19011 Primary osteoarthritis, right shoulder: Secondary | ICD-10-CM | POA: Diagnosis not present

## 2017-05-12 DIAGNOSIS — I10 Essential (primary) hypertension: Secondary | ICD-10-CM | POA: Diagnosis not present

## 2017-05-12 MED ORDER — OXYCODONE-ACETAMINOPHEN 5-325 MG PO TABS: 1.0000 | ORAL_TABLET | Freq: Three times a day (TID) | ORAL | 0 refills | Status: DC | PRN

## 2017-05-12 NOTE — ED Provider Notes (Signed)
[facility]-EMERGENCY DEPT Provider Note   CSN: [id number] Arrival date & time: 05/12/17  1203     History   Chief Complaint Chief Complaint  Patient presents with  . Shoulder Pain    HPI Kimberly Rasmussen is a 83 y.o. female.  HPI Pt has been having trouble with pain in her neck for 3-4 months.  In the last couple of weeks she started having pain in her shoulders.   Both shoulders ache and are sore.  It is making it difficult for her to sleep at night.  It hurts to lift the arms.  She had not seen anyone for this in the past. No fevers.  No trouble with her breathing.  Sometimes she has some pain in her chest at night when she goes to bed.   Nothing right now.  NO injuries or falls.  Past Medical History:  Diagnosis Date  . Arthritis   . Diverticulitis   . Diverticulosis   . Dyslipidemia   . Dysrhythmia    irregular heart beat  . HTN (hypertension)   . Jaundice, obstructive, intrahepatic    s/p ERCP, lithotripsy, and stone removal 01/2011, 02/2011  . PONV (postoperative nausea and vomiting)    N&V when awakening from anesthesia  . Shortness of breath    saw cardiologist for this  . UTI (urinary tract infection)    history of UTI    Patient Active Problem List   Diagnosis Date Noted  . Gastrointestinal hemorrhage associated with intestinal diverticulosis   . Diverticulitis 06/01/2013  . GI bleed 07/31/2011  . Anemia 07/31/2011  . Diverticulosis 07/31/2011  . Degenerative arthritis of left knee 07/13/2011  . Hypertension 10/23/2010  . Hyperlipidemia 10/23/2010    Past Surgical History:  Procedure Laterality Date  . CHOLECYSTECTOMY     removal of gallstones x4 (?ERCP)  . ERCP     with lithotripsy and gall stone removal 2012  . ESOPHAGOGASTRODUODENOSCOPY N/A 04/11/2015   Procedure: ESOPHAGOGASTRODUODENOSCOPY (EGD);  Surgeon: Jeani Hawking, MD;  Location: [facility] ENDOSCOPY;  Service: Endoscopy;  Laterality: N/A;  . FOOT SURGERY     x2  . KNEE  SURGERY     LEFT KNEE  . TOTAL ABDOMINAL HYSTERECTOMY    . TOTAL KNEE ARTHROPLASTY     RIGHT KNEE  . TOTAL KNEE ARTHROPLASTY  07/13/2011   Procedure: TOTAL KNEE ARTHROPLASTY;  Surgeon: Nestor Lewandowsky;  Location: [facility] OR;  Service: Orthopedics;  Laterality: Left;  Left Total Knee Arthroplasty with Hardware Removal     OB History    No data available       Home Medications    Prior to Admission medications   Medication Sig Start Date End Date Taking? Authorizing Provider  alendronate (FOSAMAX) 70 MG tablet Take 70 mg by mouth once a week. Take with a full glass of water on an empty stomach. Take on monday's    [provider]  amLODipine (NORVASC) 5 MG tablet Take 1 tablet (5 mg total) by mouth daily. 04/12/15   Elgergawy, Leana Roe, MD  calcium-vitamin D (OSCAL WITH D) 500-200 MG-UNIT per tablet Take 1 tablet by mouth daily with breakfast.    [provider]  celecoxib (CELEBREX) 200 MG capsule Take 1 capsule (200 mg total) by mouth daily. 04/18/15   Elgergawy, Leana Roe, MD  cetirizine (ZYRTEC ALLERGY) 10 MG tablet Take 1 tablet (10 mg total) by mouth daily. Patient not taking: Reported on 04/09/2015 12/28/13   Gwyneth Sprout, MD  fluticasone (  FLONASE) 50 MCG/ACT nasal spray Place 1 spray into both nostrils daily.    [provider]  Glucosamine-Chondroit-Vit C-Mn (GLUCOSAMINE 1500 COMPLEX PO) Take 1,500 mg by mouth daily.    [provider]  HYDROcodone-acetaminophen (NORCO/VICODIN) 5-325 MG per tablet Take 1 tablet by mouth every 6 (six) hours as needed for moderate pain.    [provider]  Multiple Vitamins-Minerals (MULTIVITAMIN PO) Take 1 tablet by mouth daily.    [provider]  oxyCODONE-acetaminophen (ROXICET) 5-325 MG tablet Take 1 tablet by mouth every 8 (eight) hours as needed for severe pain. 05/12/17   Linwood Dibbles, MD  pantoprazole (PROTONIX) 40 MG tablet Take 1 tablet (40 mg total) by mouth daily. 04/12/15   Elgergawy, Leana Roe, MD  polyethylene glycol (MIRALAX / GLYCOLAX) packet Take 17 g by mouth daily as needed for mild constipation.    [provider]  ranitidine (ZANTAC) 150 MG tablet Take 1 tablet (150 mg total) by mouth 2 (two) times daily. Patient not taking: Reported on 04/09/2015 12/28/13   Gwyneth Sprout, MD  RESTASIS 0.05 % ophthalmic emulsion Place 1 drop into both eyes daily. 12/06/13   [provider]    Family History Family History  Problem Relation Age of Onset  . Pneumonia Mother   . Lung cancer Sister   . Hypertension Maternal Grandmother   . Stroke Maternal Grandmother     Social History Social History  Substance Use Topics  . Smoking status: Former Smoker    Packs/day: 0.50    Years: 40.00    Types: Cigarettes    Quit date: 07/05/1996  . Smokeless tobacco: Never Used  . Alcohol use 0.6 oz/week    1 Glasses of wine per week     Comment: occasional     Allergies   Amitriptyline; Gabapentin; Meloxicam; and Tramadol   Review of Systems Review of Systems  All other systems reviewed and are negative.    Physical Exam Updated Vital Signs BP (!) 152/77   Pulse 73   Temp 98 F (36.7 C) (Oral)   Resp 16   SpO2 97%   Physical Exam  Constitutional: No distress.  Elderly, frail  HENT:  Head: Normocephalic and atraumatic.  Right Ear: External ear normal.  Left Ear: External ear normal.  Eyes: Conjunctivae are normal. Right eye exhibits no discharge. Left eye exhibits no discharge. No scleral icterus.  Neck: Neck supple. No tracheal deviation present.  Cardiovascular: Normal rate, regular rhythm and intact distal pulses.   Pulmonary/Chest: Effort normal and breath sounds normal. No stridor. No respiratory distress. She has no wheezes. She has no rales.  Abdominal: Soft. Bowel sounds are normal. She exhibits no distension. There is no tenderness. There is no rebound and no guarding.  Musculoskeletal: She exhibits no edema.       Right shoulder: She  exhibits decreased range of motion and bony tenderness. She exhibits no swelling and no effusion.       Left shoulder: She exhibits decreased range of motion, tenderness and bony tenderness. She exhibits no deformity.  Patient is decreased range of motion of both shoulders, she has pain whenever she tries to lift her arms above her head, she also has pain with passive range of motion,the right arm range of motion is more limited than the left.  Neurological: She is alert. She has normal strength. No cranial nerve deficit (no facial droop, extraocular movements intact, no slurred speech) or sensory deficit. She exhibits normal muscle tone. She  displays no seizure activity. Coordination normal.  Skin: Skin is warm and dry. No rash noted. She is not diaphoretic.  Psychiatric: She has a normal mood and affect.  Nursing note and vitals reviewed.    ED Treatments / Results  Labs (all labs ordered are listed, but only abnormal results are displayed) Labs Reviewed - No data to display   Radiology Dg Shoulder Right  Result Date: 05/12/2017 CLINICAL DATA:  Bilateral shoulder pain.  No injury. EXAM: LEFT SHOULDER - 2+ VIEW; RIGHT SHOULDER - 2+ VIEW COMPARISON:  None. FINDINGS: RIGHT: The humeral head is well-formed and located. The subacromial, glenohumeral and acromioclavicular joint spaces are intact. Moderate to severe subacromial and glenohumeral joint space narrowing with periarticular spurring. Osteopenia. No destructive bony lesions. Soft tissue planes are non-suspicious. LEFT: The humeral head is well-formed and located. The subacromial, glenohumeral and acromioclavicular joint spaces are intact. Severe subacromial and glenohumeral joint space narrowing with periarticular spurring. Osteopenia. No destructive bony lesions. Soft tissue planes are non-suspicious. Calcific atherosclerosis aortic arch. Degenerative change of the included cervical spine. IMPRESSION: RIGHT SHOULDER: Moderate to severe  degenerative change of the shoulder without acute fracture deformity or dislocation. LEFT SHOULDER: Severe degenerative change of the shoulder without acute fracture deformity or dislocation. Aortic Atherosclerosis (ICD10-I70.0). Electronically Signed   By: Awilda Metroourtnay  Bloomer M.D.   On: 05/12/2017 14:45   Dg Shoulder Left  Result Date: 05/12/2017 CLINICAL DATA:  Bilateral shoulder pain.  No injury. EXAM: LEFT SHOULDER - 2+ VIEW; RIGHT SHOULDER - 2+ VIEW COMPARISON:  None. FINDINGS: RIGHT: The humeral head is well-formed and located. The subacromial, glenohumeral and acromioclavicular joint spaces are intact. Moderate to severe subacromial and glenohumeral joint space narrowing with periarticular spurring. Osteopenia. No destructive bony lesions. Soft tissue planes are non-suspicious. LEFT: The humeral head is well-formed and located. The subacromial, glenohumeral and acromioclavicular joint spaces are intact. Severe subacromial and glenohumeral joint space narrowing with periarticular spurring. Osteopenia. No destructive bony lesions. Soft tissue planes are non-suspicious. Calcific atherosclerosis aortic arch. Degenerative change of the included cervical spine. IMPRESSION: RIGHT SHOULDER: Moderate to severe degenerative change of the shoulder without acute fracture deformity or dislocation. LEFT SHOULDER: Severe degenerative change of the shoulder without acute fracture deformity or dislocation. Aortic Atherosclerosis (ICD10-I70.0). Electronically Signed   By: Awilda Metroourtnay  Bloomer M.D.   On: 05/12/2017 14:45    Procedures Procedures (including critical care time)  Medications Ordered in ED Medications - No data to display   Initial Impression / Assessment and Plan / ED Course  I have reviewed the triage vital signs and the nursing notes.  Pertinent labs & imaging results that were available during my care of the patient were reviewed by me and considered in my medical decision making (see chart for  details).   patient presents to the emergency room with complaints of shoulder pain. No known injuries. Her symptoms are musculoskeletal in nature. She has decreased range of motion. X-ray shows bilateral osteoarthritis. Patient has been taking hydrocodone. Give her a few days of oxycodone to try instead. I stressed the importance of following up with an orthopedic doctor. They may be able to do additional treatments such as physical therapy and injections.  Discussed findings with patient and daughter.  They are comfortable with the plan.  Final Clinical Impressions(s) / ED Diagnoses   Final diagnoses:  Primary osteoarthritis of both shoulders    New Prescriptions New Prescriptions   OXYCODONE-ACETAMINOPHEN (ROXICET) 5-325 MG TABLET    Take 1 tablet by mouth every  8 (eight) hours as needed for severe pain.     Linwood Dibbles, MD 05/12/17 Jerene Bears

## 2017-05-12 NOTE — ED Triage Notes (Addendum)
Pt reports neck pain for some time. For the past 2 weeks she has had bilateral shoulder pain and trouble moving them. Pain in shoulder makes it hard to move/sleep due to pain. No acute injury. Pain worse with stretching out arms. Grips strong bilaterally. Pt also reports a rash on her sternal area.

## 2017-05-12 NOTE — Discharge Instructions (Signed)
Do not take the hydrocodone while taking the oxycodone, follow up with an orthopedic doctor for further evaluation

## 2018-04-08 ENCOUNTER — Other Ambulatory Visit: Payer: Self-pay | Admitting: Family Medicine

## 2018-04-08 DIAGNOSIS — R51 Headache: Principal | ICD-10-CM

## 2018-04-08 DIAGNOSIS — R519 Headache, unspecified: Secondary | ICD-10-CM

## 2018-04-11 ENCOUNTER — Ambulatory Visit
Admission: RE | Admit: 2018-04-11 | Discharge: 2018-04-11 | Disposition: A | Discharge status: Home / Self Care | Payer: Medicare Other | Source: Ambulatory Visit | Attending: Family Medicine | Admitting: Family Medicine

## 2018-04-11 DIAGNOSIS — R519 Headache, unspecified: Secondary | ICD-10-CM

## 2018-04-11 DIAGNOSIS — R51 Headache: Principal | ICD-10-CM

## 2018-04-11 MED ORDER — GADOBENATE DIMEGLUMINE 529 MG/ML IV SOLN
13.0000 mL | Freq: Once | INTRAVENOUS | Status: AC | PRN
  Administered 2018-04-11: 13 mL via INTRAVENOUS

## 2018-09-19 ENCOUNTER — Encounter (HOSPITAL_COMMUNITY): Payer: Self-pay | Admitting: *Deleted

## 2018-09-19 ENCOUNTER — Observation Stay (HOSPITAL_COMMUNITY)
Admission: EM | Admit: 2018-09-19 | Discharge: 2018-09-21 | Disposition: A | Discharge status: Home / Self Care | Payer: Medicare Other | Attending: Internal Medicine | Admitting: Internal Medicine

## 2018-09-19 ENCOUNTER — Other Ambulatory Visit: Payer: Self-pay

## 2018-09-19 DIAGNOSIS — M6281 Muscle weakness (generalized): Secondary | ICD-10-CM | POA: Insufficient documentation

## 2018-09-19 DIAGNOSIS — R2681 Unsteadiness on feet: Secondary | ICD-10-CM | POA: Insufficient documentation

## 2018-09-19 DIAGNOSIS — K922 Gastrointestinal hemorrhage, unspecified: Secondary | ICD-10-CM | POA: Diagnosis not present

## 2018-09-19 DIAGNOSIS — D649 Anemia, unspecified: Secondary | ICD-10-CM | POA: Diagnosis present

## 2018-09-19 DIAGNOSIS — Z79899 Other long term (current) drug therapy: Secondary | ICD-10-CM | POA: Diagnosis not present

## 2018-09-19 DIAGNOSIS — R55 Syncope and collapse: Secondary | ICD-10-CM | POA: Diagnosis present

## 2018-09-19 DIAGNOSIS — K5791 Diverticulosis of intestine, part unspecified, without perforation or abscess with bleeding: Secondary | ICD-10-CM

## 2018-09-19 DIAGNOSIS — Z87891 Personal history of nicotine dependence: Secondary | ICD-10-CM | POA: Insufficient documentation

## 2018-09-19 DIAGNOSIS — Z791 Long term (current) use of non-steroidal anti-inflammatories (NSAID): Secondary | ICD-10-CM | POA: Diagnosis not present

## 2018-09-19 DIAGNOSIS — M1990 Unspecified osteoarthritis, unspecified site: Secondary | ICD-10-CM | POA: Diagnosis not present

## 2018-09-19 DIAGNOSIS — N179 Acute kidney failure, unspecified: Secondary | ICD-10-CM | POA: Diagnosis not present

## 2018-09-19 DIAGNOSIS — K5790 Diverticulosis of intestine, part unspecified, without perforation or abscess without bleeding: Secondary | ICD-10-CM | POA: Diagnosis not present

## 2018-09-19 DIAGNOSIS — I10 Essential (primary) hypertension: Secondary | ICD-10-CM

## 2018-09-19 DIAGNOSIS — K219 Gastro-esophageal reflux disease without esophagitis: Secondary | ICD-10-CM

## 2018-09-19 DIAGNOSIS — E785 Hyperlipidemia, unspecified: Secondary | ICD-10-CM | POA: Insufficient documentation

## 2018-09-19 DIAGNOSIS — Z7951 Long term (current) use of inhaled steroids: Secondary | ICD-10-CM | POA: Diagnosis not present

## 2018-09-19 DIAGNOSIS — K449 Diaphragmatic hernia without obstruction or gangrene: Secondary | ICD-10-CM | POA: Diagnosis not present

## 2018-09-19 HISTORY — DX: Acute kidney failure, unspecified: N17.9

## 2018-09-19 LAB — TYPE AND SCREEN
ABO/RH(D): O POS
Antibody Screen: NEGATIVE

## 2018-09-19 LAB — CBC
HCT: 35.3 % — ABNORMAL LOW (ref 36.0–46.0)
HCT: 27.8 % — ABNORMAL LOW (ref 36.0–46.0)
HCT: 31.7 % — ABNORMAL LOW (ref 36.0–46.0)
HEMOGLOBIN: 10 g/dL — AB (ref 12.0–15.0)
Hemoglobin: 10.6 g/dL — ABNORMAL LOW (ref 12.0–15.0)
Hemoglobin: 8.8 g/dL — ABNORMAL LOW (ref 12.0–15.0)
MCH: 27.9 pg (ref 26.0–34.0)
MCH: 29 pg (ref 26.0–34.0)
MCH: 28.9 pg (ref 26.0–34.0)
MCHC: 30 g/dL (ref 30.0–36.0)
MCHC: 31.7 g/dL (ref 30.0–36.0)
MCHC: 31.5 g/dL (ref 30.0–36.0)
MCV: 92.9 fL (ref 80.0–100.0)
MCV: 91.7 fL (ref 80.0–100.0)
MCV: 91.6 fL (ref 80.0–100.0)
NRBC: 0 % (ref 0.0–0.2)
NRBC: 0 % (ref 0.0–0.2)
Platelets: 300 10*3/uL (ref 150–400)
Platelets: 259 10*3/uL (ref 150–400)
Platelets: UNDETERMINED 10*3/uL (ref 150–400)
RBC: 3.8 MIL/uL — ABNORMAL LOW (ref 3.87–5.11)
RBC: 3.03 MIL/uL — ABNORMAL LOW (ref 3.87–5.11)
RBC: 3.46 MIL/uL — ABNORMAL LOW (ref 3.87–5.11)
RDW: 14.8 % (ref 11.5–15.5)
RDW: 14.9 % (ref 11.5–15.5)
RDW: 15 % (ref 11.5–15.5)
WBC: 13.1 10*3/uL — ABNORMAL HIGH (ref 4.0–10.5)
WBC: 6.8 10*3/uL (ref 4.0–10.5)
WBC: 6.5 10*3/uL (ref 4.0–10.5)
nRBC: 0 % (ref 0.0–0.2)

## 2018-09-19 LAB — PROTIME-INR
INR: 1.11
Prothrombin Time: 14.2 seconds (ref 11.4–15.2)

## 2018-09-19 LAB — COMPREHENSIVE METABOLIC PANEL
ALK PHOS: 50 U/L (ref 38–126)
ALT: 14 U/L (ref 0–44)
AST: 23 U/L (ref 15–41)
Albumin: 3.1 g/dL — ABNORMAL LOW (ref 3.5–5.0)
Anion gap: 12 (ref 5–15)
BUN: 19 mg/dL (ref 8–23)
CALCIUM: 9.1 mg/dL (ref 8.9–10.3)
CO2: 21 mmol/L — ABNORMAL LOW (ref 22–32)
Chloride: 110 mmol/L (ref 98–111)
Creatinine, Ser: 1.1 mg/dL — ABNORMAL HIGH (ref 0.44–1.00)
GFR calc Af Amer: 51 mL/min — ABNORMAL LOW (ref >60)
GFR calc non Af Amer: 44 mL/min — ABNORMAL LOW (ref >60)
Glucose, Bld: 110 mg/dL — ABNORMAL HIGH (ref 70–99)
Potassium: 4.3 mmol/L (ref 3.5–5.1)
Sodium: 143 mmol/L (ref 135–145)
Total Bilirubin: 0.1 mg/dL — ABNORMAL LOW (ref 0.3–1.2)
Total Protein: 6.3 g/dL — ABNORMAL LOW (ref 6.5–8.1)

## 2018-09-19 LAB — APTT: aPTT: 29 seconds (ref 24–36)

## 2018-09-19 LAB — CBC WITH DIFFERENTIAL/PLATELET
Abs Immature Granulocytes: 0.06 10*3/uL (ref 0.00–0.07)
Basophils Absolute: 0 10*3/uL (ref 0.0–0.1)
Basophils Relative: 0 %
Eosinophils Absolute: 0.1 10*3/uL (ref 0.0–0.5)
Eosinophils Relative: 1 %
HCT: 37.6 % (ref 36.0–46.0)
Hemoglobin: 11.1 g/dL — ABNORMAL LOW (ref 12.0–15.0)
Immature Granulocytes: 1 %
Lymphocytes Relative: 18 %
Lymphs Abs: 1.7 10*3/uL (ref 0.7–4.0)
MCH: 28.2 pg (ref 26.0–34.0)
MCHC: 29.5 g/dL — AB (ref 30.0–36.0)
MCV: 95.7 fL (ref 80.0–100.0)
Monocytes Absolute: 0.7 10*3/uL (ref 0.1–1.0)
Monocytes Relative: 7 %
Neutro Abs: 7 10*3/uL (ref 1.7–7.7)
Neutrophils Relative %: 73 %
Platelets: 300 10*3/uL (ref 150–400)
RBC: 3.93 MIL/uL (ref 3.87–5.11)
RDW: 14.6 % (ref 11.5–15.5)
WBC: 9.5 10*3/uL (ref 4.0–10.5)
nRBC: 0 % (ref 0.0–0.2)

## 2018-09-19 LAB — BASIC METABOLIC PANEL
Anion gap: 10 (ref 5–15)
BUN: 20 mg/dL (ref 8–23)
CO2: 22 mmol/L (ref 22–32)
Calcium: 9 mg/dL (ref 8.9–10.3)
Chloride: 110 mmol/L (ref 98–111)
Creatinine, Ser: 1.02 mg/dL — ABNORMAL HIGH (ref 0.44–1.00)
GFR calc Af Amer: 56 mL/min — ABNORMAL LOW (ref >60)
GFR calc non Af Amer: 48 mL/min — ABNORMAL LOW (ref >60)
Glucose, Bld: 108 mg/dL — ABNORMAL HIGH (ref 70–99)
Potassium: 5 mmol/L (ref 3.5–5.1)
Sodium: 142 mmol/L (ref 135–145)

## 2018-09-19 MED ORDER — HYDRALAZINE HCL 20 MG/ML IJ SOLN: 5.0000 mg | INTRAMUSCULAR | Status: DC | PRN

## 2018-09-19 MED ORDER — ONDANSETRON HCL 4 MG/2ML IJ SOLN: 4.0000 mg | Freq: Four times a day (QID) | INTRAMUSCULAR | Status: DC | PRN

## 2018-09-19 MED ORDER — HYDROCODONE-ACETAMINOPHEN 5-325 MG PO TABS: 1.0000 | ORAL_TABLET | Freq: Four times a day (QID) | ORAL | Status: DC | PRN

## 2018-09-19 MED ORDER — ACETAMINOPHEN 325 MG PO TABS: 650.0000 mg | ORAL_TABLET | Freq: Four times a day (QID) | ORAL | Status: DC | PRN

## 2018-09-19 MED ORDER — PANTOPRAZOLE SODIUM 40 MG IV SOLR
40.0000 mg | Freq: Two times a day (BID) | INTRAVENOUS | Status: DC
  Administered 2018-09-19: 40 mg via INTRAVENOUS
  Filled 2018-09-19: qty 40

## 2018-09-19 MED ORDER — ONDANSETRON HCL 4 MG PO TABS: 4.0000 mg | ORAL_TABLET | Freq: Four times a day (QID) | ORAL | Status: DC | PRN

## 2018-09-19 MED ORDER — SODIUM CHLORIDE 0.9 % IV SOLN
INTRAVENOUS | Status: DC
  Administered 2018-09-19 – 2018-09-20 (×2): via INTRAVENOUS

## 2018-09-19 MED ORDER — ACETAMINOPHEN 650 MG RE SUPP: 650.0000 mg | Freq: Four times a day (QID) | RECTAL | Status: DC | PRN

## 2018-09-19 MED ORDER — PANTOPRAZOLE SODIUM 40 MG PO TBEC
40.0000 mg | DELAYED_RELEASE_TABLET | Freq: Every day | ORAL | Status: DC
  Administered 2018-09-19 – 2018-09-21 (×3): 40 mg via ORAL
  Filled 2018-09-19 (×3): qty 1

## 2018-09-19 MED ORDER — HYDROCODONE-ACETAMINOPHEN 5-325 MG PO TABS
1.0000 | ORAL_TABLET | Freq: Four times a day (QID) | ORAL | Status: DC | PRN
  Administered 2018-09-19: 1 via ORAL
  Filled 2018-09-19: qty 1

## 2018-09-19 NOTE — Progress Notes (Signed)
Patient admit to room 08 from the ED with GI bleed Will continue to mo. Patient alert oriented X 4, VS within normal limit, on room air and no c/o pain at this time. Family at bedside, patient resting comfortably in bed locked at low level, bedside table, call light and telephone within patient reach. Will continue to monitor patient.

## 2018-09-19 NOTE — ED Notes (Signed)
Pt c/o abd pain  She feels like may be having another stool

## 2018-09-19 NOTE — Plan of Care (Signed)
  Problem: Health Behavior/Discharge Planning: Goal: Ability to manage health-related needs will improve Outcome: Progressing   Problem: Clinical Measurements: Goal: Ability to maintain clinical measurements within normal limits will improve Outcome: Progressing Goal: Cardiovascular complication will be avoided Outcome: Progressing   Problem: Activity: Goal: Risk for activity intolerance will decrease Outcome: Progressing   Problem: Pain Managment: Goal: General experience of comfort will improve Outcome: Progressing   Problem: Safety: Goal: Ability to remain free from injury will improve Outcome: Progressing

## 2018-09-19 NOTE — ED Provider Notes (Signed)
TIME SEEN: 2:10 AM  CHIEF COMPLAINT: GI bleed  HPI: Patient is a 85 year old female with history of diverticulosis with prior rectal bleed, hypertension who presents to the emergency department by EMS with her family for concerns for rectal bleeding that started tonight.  States she felt something warm around her bottom and noticed that she had maroon-colored blood.  Had several episodes of bloody stool at home.  No vomiting.  No abdominal pain.  Not on blood thinners.  Family is not sure if she is ever had a blood transfusion before.  ROS: See HPI Constitutional: no fever  Eyes: no drainage  ENT: no runny nose   Cardiovascular:  no chest pain  Resp: no SOB  GI: no vomiting GU: no dysuria Integumentary: no rash  Allergy: no hives  Musculoskeletal: no leg swelling  Neurological: no slurred speech ROS otherwise negative  PAST MEDICAL HISTORY/PAST SURGICAL HISTORY:  Past Medical History:  Diagnosis Date  . Arthritis   . Diverticulitis   . Diverticulosis   . Dyslipidemia   . Dysrhythmia    irregular heart beat  . HTN (hypertension)   . Jaundice, obstructive, intrahepatic    s/p ERCP, lithotripsy, and stone removal 01/2011, 02/2011  . PONV (postoperative nausea and vomiting)    N&V when awakening from anesthesia  . Shortness of breath    saw cardiologist for this  . UTI (urinary tract infection)    history of UTI    MEDICATIONS:  Prior to Admission medications   Medication Sig Start Date End Date Taking? Authorizing Provider  alendronate (FOSAMAX) 70 MG tablet Take 70 mg by mouth once a week. Take with a full glass of water on an empty stomach. Take on monday's    [provider]  amLODipine (NORVASC) 5 MG tablet Take 1 tablet (5 mg total) by mouth daily. 04/12/15   Elgergawy, Leana Roe, MD  calcium-vitamin D (OSCAL WITH D) 500-200 MG-UNIT per tablet Take 1 tablet by mouth daily with breakfast.    [provider]  celecoxib (CELEBREX) 200 MG capsule Take 1  capsule (200 mg total) by mouth daily. 04/18/15   Elgergawy, Leana Roe, MD  cetirizine (ZYRTEC ALLERGY) 10 MG tablet Take 1 tablet (10 mg total) by mouth daily. Patient not taking: Reported on 04/09/2015 12/28/13   Gwyneth Sprout, MD  fluticasone [facility]) 50 MCG/ACT nasal spray Place 1 spray into both nostrils daily.    [provider]  Glucosamine-Chondroit-Vit C-Mn (GLUCOSAMINE 1500 COMPLEX PO) Take 1,500 mg by mouth daily.    [provider]  HYDROcodone-acetaminophen (NORCO/VICODIN) 5-325 MG per tablet Take 1 tablet by mouth every 6 (six) hours as needed for moderate pain.    [provider]  Multiple Vitamins-Minerals (MULTIVITAMIN PO) Take 1 tablet by mouth daily.    [provider]  oxyCODONE-acetaminophen (ROXICET) 5-325 MG tablet Take 1 tablet by mouth every 8 (eight) hours as needed for severe pain. 05/12/17   Linwood Dibbles, MD  pantoprazole (PROTONIX) 40 MG tablet Take 1 tablet (40 mg total) by mouth daily. 04/12/15   Elgergawy, Leana Roe, MD  polyethylene glycol (MIRALAX / GLYCOLAX) packet Take 17 g by mouth daily as needed for mild constipation.    [provider]  ranitidine (ZANTAC) 150 MG tablet Take 1 tablet (150 mg total) by mouth 2 (two) times daily. Patient not taking: Reported on 04/09/2015 12/28/13   Gwyneth Sprout, MD  RESTASIS 0.05 % ophthalmic emulsion Place 1 drop into both eyes daily. 12/06/13   [provider]    ALLERGIES:  Allergies  Allergen Reactions  . Amitriptyline Other (See Comments)    Hallucinations   . Gabapentin Other (See Comments)    Hallucinations   . Meloxicam Other (See Comments)    Hallucinations   . Tramadol Other (See Comments)    Hallucinations     SOCIAL HISTORY:  Social History   Tobacco Use  . Smoking status: Former Smoker    Packs/day: 0.50    Years: 40.00    Pack years: 20.00    Types: Cigarettes    Last attempt to quit: 07/05/1996    Years since quitting: 22.2  . Smokeless  tobacco: Never Used  Substance Use Topics  . Alcohol use: Yes    Alcohol/week: 1.0 standard drinks    Types: 1 Glasses of wine per week    Comment: occasional    FAMILY HISTORY: Family History  Problem Relation Age of Onset  . Pneumonia Mother   . Lung cancer Sister   . Hypertension Maternal Grandmother   . Stroke Maternal Grandmother     EXAM: BP 111/63 (BP Location: Right Arm)   Pulse 94   Temp (!) 97.5 F (36.4 C) (Oral)   Resp 16   SpO2 98%  CONSTITUTIONAL: Alert and oriented and responds appropriately to questions.  Elderly, chronically ill-appearing, in no distress, hard of hearing HEAD: Normocephalic EYES: Conjunctivae clear, pupils appear equal, EOMI ENT: normal nose; moist mucous membranes NECK: Supple, no meningismus, no nuchal rigidity, no LAD  CARD: RRR; S1 and S2 appreciated; no murmurs, no clicks, no rubs, no gallops RESP: Normal chest excursion without splinting or tachypnea; breath sounds clear and equal bilaterally; no wheezes, no rhonchi, no rales, no hypoxia or respiratory distress, speaking full sentences ABD/GI: Normal bowel sounds; non-distended; soft, non-tender, no rebound, no guarding, no peritoneal signs, no hepatosplenomegaly RECTAL: Patient has a lot of stool outside of her rectum with maroon-colored blood and some clots, no bright red blood, no melena BACK:  The back appears normal and is non-tender to palpation, there is no CVA tenderness EXT: Normal ROM in all joints; non-tender to palpation; no edema; normal capillary refill; no cyanosis, no calf tenderness or swelling    SKIN: Normal color for age and race; warm; no rash NEURO: Moves all extremities equally PSYCH: The patient's mood and manner are appropriate. Grooming and personal hygiene are appropriate.  MEDICAL DECISION MAKING: Patient here with GI bleed.  Currently hemodynamically stable but will monitor closely and she has significant bleeding on exam.  Will obtain 2 IVs, labs, type and  screen.  Will start IV fluids.  I feel patient will need admission given the amount of bleeding present currently.  Family comfortable with this plan.  ED PROGRESS: Patient hemoglobin is 11.  Blood pressures still normal but slowly dropping.  She is getting IV fluids.  Discussed with family that she may need a blood transfusion while here in the hospital.  She had a near syncopal event in the emergency department.  Blood sugar normal.  Will check EKG.  Not complaining of chest pain or shortness of breath.  Will discuss with hospitalist for admission to a stepdown bed.  3:51 AM Discussed patient's case with hospitalist, Dr. Clyde Lundborg.  I have recommended admission and patient (and family if present) agree with this plan. Admitting physician will place admission orders.   I reviewed all nursing notes, vitals, pertinent previous records, EKGs, lab and urine results, imaging (as available).    EKG Interpretation  Date/Time:  Monday September 19 2018 04:07:32 EST Ventricular Rate:  76 PR Interval:    QRS Duration: 82 QT Interval:  391 QTC Calculation: 440 R Axis:   -18 Text Interpretation:  Sinus rhythm Borderline left axis deviation Borderline T abnormalities, anterior leads No significant change since last tracing Confirmed by Rochele Raring [phone]) on 09/19/2018 4:17:15 AM         CRITICAL CARE Performed by: Baxter Hire Helmi Hechavarria   Total critical care time: 65 minutes  Critical care time was exclusive of separately billable procedures and treating other patients.  Critical care was necessary to treat or prevent imminent or life-threatening deterioration.  Critical care was time spent personally by me on the following activities: development of treatment plan with patient and/or surrogate as well as nursing, discussions with consultants, evaluation of patient's response to treatment, examination of patient, obtaining history from patient or surrogate, ordering and performing treatments and  interventions, ordering and review of laboratory studies, ordering and review of radiographic studies, pulse oximetry and re-evaluation of patient's condition.     Meleane Selinger, Layla Maw, DO 09/19/18 [phone]

## 2018-09-19 NOTE — ED Notes (Signed)
Pt had bright and dark bloody stools mixed moderate amount some clots

## 2018-09-19 NOTE — H&P (Addendum)
History and Physical    Kimberly FontanaCatherine Rasmussen ZOX:[id number] DOB: Sep 30, 1925 DOA: 09/19/2018  Referring MD/NP/PA:   PCP: Mila PalmerWolters, Sharon, MD   Patient coming from:  The patient is coming from home.  At baseline, pt is partially ependent for most of ADL.        Chief Complaint: rectal blooding  HPI: Kimberly FontanaCatherine Rasmussen is a 85 y.o. female with medical history significant of hypertension, hyperlipidemia, GERD, diverticulitis, who presents with rectal bleeding.  Per her daughter, rectal bleeding started around 12:30 AM.  Patient has had several episodes of maroon-colored rectal bleeding, with large amount of blood clot.  Patient has mild lower abdominal pain, but no nausea, vomiting.  Denies fever or chills.  Patient does not have shortness of breath, chest pain.  In the ED, pt had near syncope episode after she had an another episode of rectal bleeding. Denies symptoms of UTI or unilateral weakness.  Patient has a mild headache.  Patient has known diverticular bleeding and diverticulitis in the past.  Patient had EGD by Dr. Elnoria HowardHung on 04/11/2015 which showed hiatal hernia.  ED Course: pt was found to have hemoglobin 11.1 (9.7 on 04/12/2015), INR 1.11, PTT 29, AKI with creatinine 1.10, BUN 19, GFR 51, temperature 97.1, heart rate 94, oxygen saturation 98% on room air.  Patient is placed on stepdown bed for observation.  Review of Systems:   General: no fevers, chills, no body weight gain, has fatigue HEENT: no blurry vision, hearing changes or sore throat Respiratory: no dyspnea, coughing, wheezing CV: no chest pain, no palpitations GI: no nausea, vomiting, has lower abdominal pain, and rectal bleeding, no diarrhea, constipation GU: no dysuria, burning on urination, increased urinary frequency, hematuria  Ext: no leg edema Neuro: no unilateral weakness, numbness, or tingling, no vision change or hearing loss. Has presyncope Skin: no rash, no skin tear. MSK: No muscle spasm, no deformity, no  limitation of range of movement in spin Heme: No easy bruising.  Travel history: No recent long distant travel.  Allergy:  Allergies  Allergen Reactions  . Amitriptyline Other (See Comments)    Hallucinations   . Gabapentin Other (See Comments)    Hallucinations   . Meloxicam Other (See Comments)    Hallucinations   . Tramadol Other (See Comments)    Hallucinations     Past Medical History:  Diagnosis Date  . AKI (acute kidney injury) (HCC) 09/19/2018  . Arthritis   . Diverticulitis   . Diverticulosis   . Dyslipidemia   . Dysrhythmia    irregular heart beat  . HTN (hypertension)   . Jaundice, obstructive, intrahepatic    s/p ERCP, lithotripsy, and stone removal 01/2011, 02/2011  . PONV (postoperative nausea and vomiting)    N&V when awakening from anesthesia  . Shortness of breath    saw cardiologist for this  . UTI (urinary tract infection)    history of UTI    Past Surgical History:  Procedure Laterality Date  . CHOLECYSTECTOMY     removal of gallstones x4 (?ERCP)  . ERCP     with lithotripsy and gall stone removal 2012  . ESOPHAGOGASTRODUODENOSCOPY N/A 04/11/2015   Procedure: ESOPHAGOGASTRODUODENOSCOPY (EGD);  Surgeon: Jeani HawkingPatrick Hung, MD;  [facility];  Service: Endoscopy;  Laterality: N/A;  . FOOT SURGERY     x2  . KNEE SURGERY     LEFT KNEE  . TOTAL ABDOMINAL HYSTERECTOMY    . TOTAL KNEE ARTHROPLASTY     RIGHT KNEE  . TOTAL KNEE ARTHROPLASTY  07/13/2011   Procedure: TOTAL KNEE ARTHROPLASTY;  Surgeon: Nestor Lewandowsky;  Location: [facility] OR;  Service: Orthopedics;  Laterality: Left;  Left Total Knee Arthroplasty with Hardware Removal     Social History:  reports that she quit smoking about 22 years ago. Her smoking use included cigarettes. She has a 20.00 pack-year smoking history. She has never used smokeless tobacco. She reports current alcohol use of about 1.0 standard drinks of alcohol per week. She reports that she does not use drugs.  Family History:   Family History  Problem Relation Age of Onset  . Pneumonia Mother   . Lung cancer Sister   . Hypertension Maternal Grandmother   . Stroke Maternal Grandmother      Prior to Admission medications   Medication Sig Start Date End Date Taking? Authorizing Provider  alendronate (FOSAMAX) 70 MG tablet Take 70 mg by mouth once a week. Take with a full glass of water on an empty stomach. Take on monday's   Yes [provider]  calcium-vitamin D (OSCAL WITH D) 500-200 MG-UNIT per tablet Take 1 tablet by mouth daily with breakfast.   Yes [provider]  celecoxib (CELEBREX) 200 MG capsule Take 1 capsule (200 mg total) by mouth daily. 04/18/15  Yes Elgergawy, Leana Roe, MD  HYDROcodone-acetaminophen (NORCO/VICODIN) 5-325 MG per tablet Take 1 tablet by mouth every 6 (six) hours as needed for moderate pain.   Yes [provider]  Multiple Vitamins-Minerals (MULTIVITAMIN PO) Take 1 tablet by mouth daily.   Yes [provider]  amLODipine (NORVASC) 5 MG tablet Take 1 tablet (5 mg total) by mouth daily. Patient not taking: Reported on 09/19/2018 04/12/15   Elgergawy, Leana Roe, MD  cetirizine (ZYRTEC ALLERGY) 10 MG tablet Take 1 tablet (10 mg total) by mouth daily. Patient not taking: Reported on 04/09/2015 12/28/13   Gwyneth Sprout, MD  oxyCODONE-acetaminophen (ROXICET) 5-325 MG tablet Take 1 tablet by mouth every 8 (eight) hours as needed for severe pain. Patient not taking: Reported on 09/19/2018 05/12/17   Linwood Dibbles, MD  pantoprazole (PROTONIX) 40 MG tablet Take 1 tablet (40 mg total) by mouth daily. Patient not taking: Reported on 09/19/2018 04/12/15   Elgergawy, Leana Roe, MD  ranitidine (ZANTAC) 150 MG tablet Take 1 tablet (150 mg total) by mouth 2 (two) times daily. Patient not taking: Reported on 04/09/2015 12/28/13   Gwyneth Sprout, MD    Physical Exam: Vitals:   09/19/18 0159 09/19/18 0420  BP: 111/63 136/75  Pulse: 94 78  Resp: 16 14  Temp: (!) 97.5 F  (36.4 C) (!) 97.4 F (36.3 C)  TempSrc: Oral Oral  SpO2: 98% 100%   General: Not in acute distress HEENT:       Eyes: PERRL, EOMI, no scleral icterus.       ENT: No discharge from the ears and nose, no pharynx injection, no tonsillar enlargement.        Neck: No JVD, no bruit, no mass felt. Heme: No neck lymph node enlargement. Cardiac: S1/S2, RRR, No murmurs, No gallops or rubs. Respiratory: No rales, wheezing, rhonchi or rubs. GI: Soft, nondistended, has mild tenderness in lower abdomen, no rebound pain, no organomegaly, BS present. GU: No hematuria Ext: No pitting leg edema bilaterally. 2+DP/PT pulse bilaterally. Musculoskeletal: No joint deformities, No joint redness or warmth, no limitation of ROM in spin. Skin: No rashes.  Neuro: Alert, oriented X3, cranial nerves II-XII grossly intact, moves all extremities normally. Psych: Patient is not psychotic, no suicidal  or hemocidal ideation.  Labs on Admission: I have personally reviewed following labs and imaging studies  CBC: Recent Labs  Lab 09/19/18 0235 09/19/18 0414  WBC 9.5 13.1*  NEUTROABS 7.0  --   HGB 11.1* 10.6*  HCT 37.6 35.3*  MCV 95.7 92.9  PLT 300 300   Basic Metabolic Panel: Recent Labs  Lab 09/19/18 0235  NA 143  K 4.3  CL 110  CO2 21*  GLUCOSE 110*  BUN 19  CREATININE 1.10*  CALCIUM 9.1   GFR: CrCl cannot be calculated (Unknown ideal weight.). Liver Function Tests: Recent Labs  Lab 09/19/18 0235  AST 23  ALT 14  ALKPHOS 50  BILITOT 0.1*  PROT 6.3*  ALBUMIN 3.1*   No results for input(s): LIPASE, AMYLASE in the last 168 hours. No results for input(s): AMMONIA in the last 168 hours. Coagulation Profile: Recent Labs  Lab 09/19/18 0235  INR 1.11   Cardiac Enzymes: No results for input(s): CKTOTAL, CKMB, CKMBINDEX, TROPONINI in the last 168 hours. BNP (last 3 results) No results for input(s): PROBNP in the last 8760 hours. HbA1C: No results for input(s): HGBA1C in the last 72  hours. CBG: No results for input(s): GLUCAP in the last 168 hours. Lipid Profile: No results for input(s): CHOL, HDL, LDLCALC, TRIG, CHOLHDL, LDLDIRECT in the last 72 hours. Thyroid Function Tests: No results for input(s): TSH, T4TOTAL, FREET4, T3FREE, THYROIDAB in the last 72 hours. Anemia Panel: No results for input(s): VITAMINB12, FOLATE, FERRITIN, TIBC, IRON, RETICCTPCT in the last 72 hours. Urine analysis:    Component Value Date/Time   COLORURINE YELLOW 06/01/2013 2131   APPEARANCEUR CLEAR 06/01/2013 2131   LABSPEC 1.014 06/01/2013 2131   PHURINE 7.0 06/01/2013 2131   GLUCOSEU NEGATIVE 06/01/2013 2131   HGBUR NEGATIVE 06/01/2013 2131   BILIRUBINUR NEGATIVE 06/01/2013 2131   KETONESUR NEGATIVE 06/01/2013 2131   PROTEINUR NEGATIVE 06/01/2013 2131   UROBILINOGEN 0.2 06/01/2013 2131   NITRITE NEGATIVE 06/01/2013 2131   LEUKOCYTESUR NEGATIVE 06/01/2013 2131   Sepsis Labs: (procalcitonin:4,lacticidven:4) )No results found for this or any previous visit (from the past 240 hour(s)).   Radiological Exams on Admission: No results found.   EKG: Independently reviewed.  Sinus rhythm, QTC 440, low voltage, LAE, nonspecific T wave change.  Assessment/Plan Principal Problem:   GI bleed Active Problems:   Hypertension   Anemia   GERD (gastroesophageal reflux disease)   AKI (acute kidney injury) (HCC)   GIB (gastrointestinal bleeding)   Near syncope   GI bleed: Patient has known diverticular bleeding and diverticulitis in the past.  Patient had EGD by Dr. Elnoria Howard on 04/11/2015 which showed hiatal hernia. Today patient likely has lower GI bleeding secondary to diverticulosis.  Hemoglobin 11.1 initially, then dropped to 10.6.  Currently hemodynamically stable.  - will place in tele bed for obs - NPO - IVF: NS at 125 mL/hr - Start IV pantoprazole 40 bid - Zofran IV for nausea - hold Celebrex - Avoid NSAIDs and SQ heparin - Maintain IV access (2 large bore IVs if  possible). - Monitor closely and follow q6h cbc, transfuse as necessary, if Hgb<7.0 - LaB: INR, PTT and type screen - please call GI in AM  Hypertension: Blood pressure 111/63.  Patient is not taking medications at home -IV hydralazine as needed  Anemia due to GI blood loss: -see above  GERD (gastroesophageal reflux disease): -on IV protonix  AKI (acute kidney injury) (HCC): mild.  Creatinine 1.01, BUN 19, likely due to GI bleeding. -Follow-up  of BMP -IV fluid as above  Near syncope: Most likely due to GI bleeding and hypovolemic status.  No focal neurologic findings on physical examination. -IV fluid as above -Frequent neuro check   DVT ppx: SCD Code Status: Full code Family Communication: Yes, patient's  2 daughter at bed side Disposition Plan:  Anticipate discharge back to previous home environment Consults called:  nonw Admission status: Obs / tele    Date of Service 09/19/2018    Lorretta Harp [facility]   If 7PM-7AM, please contact night-coverage [email or web address] Password [facility] 09/19/2018, 4:44 AM

## 2018-09-19 NOTE — ED Triage Notes (Signed)
Pt arrived by gems  She woke up with some dark stool this am hx of the same one year ago

## 2018-09-19 NOTE — ED Notes (Signed)
Pt  Had a fainting spell that lasted approx 2-3 minutes  Sweating sl confused  Vitals stayed the same

## 2018-09-19 NOTE — Progress Notes (Signed)
  [place] TEAM 1 - Stepdown/ICU TEAM  Kimberly Rasmussen  [id number] DOB: 1926-03-16 DOA: 09/19/2018 PCP: [name], MD    Brief Narrative:  85 y.o. w/ a hx of hypertension, hyperlipidemia, GERD, and diverticulitis w/ prior diverticular GIB who presented with several episodes of maroon-colored rectal bleeding, with a large amount of blood clot. Patient c/o mild lower abdominal pain, but no nausea, vomiting.  In the ED she had a near syncope episode during an episode of rectal bleeding.  Significant Events: 2/24 admit   Subjective: Pt is seen for a f/u visit.    Assessment & Plan:  LGIB - likely diverticular   Acute blood loss anemia  Mild Acute kidney injury   HTN  GERD   DVT prophylaxis: SCDs Code Status: FULL CODE Family Communication:  Disposition Plan:   Consultants:  none  Antimicrobials:  none   Objective: Blood pressure 139/73, pulse 68, temperature (!) 97.4 F (36.3 C), temperature source Oral, resp. rate 14, SpO2 99 %. No intake or output data in the 24 hours ending 09/19/18 0855 There were no vitals filed for this visit.  Examination: Pt was seen for a f/u visit.    CBC: Recent Labs  Lab 09/19/18 0235 09/19/18 0414  WBC 9.5 13.1*  NEUTROABS 7.0  --   HGB 11.1* 10.6*  HCT 37.6 35.3*  MCV 95.7 92.9  PLT 300 300   Basic Metabolic Panel: Recent Labs  Lab 09/19/18 0235 09/19/18 0414  NA 143 142  K 4.3 5.0  CL 110 110  CO2 21* 22  GLUCOSE 110* 108*  BUN 19 20  CREATININE 1.10* 1.02*  CALCIUM 9.1 9.0   GFR: CrCl cannot be calculated (Unknown ideal weight.).  Liver Function Tests: Recent Labs  Lab 09/19/18 0235  AST 23  ALT 14  ALKPHOS 50  BILITOT 0.1*  PROT 6.3*  ALBUMIN 3.1*    Coagulation Profile: Recent Labs  Lab 09/19/18 0235  INR 1.11    HbA1C: Hgb A1c MFr Bld  Date/Time Value Ref Range Status  07/31/2011 05:40 AM 5.4 <5.7 % Final    Comment:    (NOTE)                         According to the ADA Clinical Practice Recommendations for 2011, when HbA1c is used as a screening test:  >=6.5%   Diagnostic of Diabetes Mellitus           (if abnormal result is confirmed) 5.7-6.4%   Increased risk of developing Diabetes Mellitus References:Diagnosis and Classification of Diabetes Mellitus,Diabetes Care,2011,34(Suppl 1):S62-S69 and Standards of Medical Care in         Diabetes - 2011,Diabetes Care,2011,34 (Suppl 1):S11-S61.    Scheduled Meds: . pantoprazole  40 mg Intravenous Q12H   Continuous Infusions: . sodium chloride 125 mL/hr at 09/19/18 0751     LOS: 0 days   Time spent: No Charge  Lonia Blood, MD [facility] Office  [phone] Pager - Text Page per Loretha Stapler as per below:  On-Call/Text Page:      [email or web address]  If 7PM-7AM, please contact night-coverage [email or web address] 09/19/2018, 8:55 AM

## 2018-09-20 DIAGNOSIS — K5791 Diverticulosis of intestine, part unspecified, without perforation or abscess with bleeding: Secondary | ICD-10-CM | POA: Diagnosis not present

## 2018-09-20 DIAGNOSIS — K922 Gastrointestinal hemorrhage, unspecified: Secondary | ICD-10-CM | POA: Diagnosis not present

## 2018-09-20 DIAGNOSIS — N179 Acute kidney failure, unspecified: Secondary | ICD-10-CM | POA: Diagnosis not present

## 2018-09-20 DIAGNOSIS — R55 Syncope and collapse: Secondary | ICD-10-CM | POA: Diagnosis not present

## 2018-09-20 LAB — FERRITIN: Ferritin: 100 ng/mL (ref 11–307)

## 2018-09-20 LAB — COMPREHENSIVE METABOLIC PANEL
ALT: 12 U/L (ref 0–44)
AST: 18 U/L (ref 15–41)
Albumin: 2.7 g/dL — ABNORMAL LOW (ref 3.5–5.0)
Alkaline Phosphatase: 46 U/L (ref 38–126)
Anion gap: 8 (ref 5–15)
BUN: 15 mg/dL (ref 8–23)
CALCIUM: 8.4 mg/dL — AB (ref 8.9–10.3)
CO2: 22 mmol/L (ref 22–32)
Chloride: 111 mmol/L (ref 98–111)
Creatinine, Ser: 0.81 mg/dL (ref 0.44–1.00)
GFR calc non Af Amer: >60 mL/min (ref >60)
Glucose, Bld: 89 mg/dL (ref 70–99)
Potassium: 3.8 mmol/L (ref 3.5–5.1)
Sodium: 141 mmol/L (ref 135–145)
Total Bilirubin: 0.7 mg/dL (ref 0.3–1.2)
Total Protein: 5.4 g/dL — ABNORMAL LOW (ref 6.5–8.1)

## 2018-09-20 LAB — CBC
HCT: 27.1 % — ABNORMAL LOW (ref 36.0–46.0)
HCT: 29.8 % — ABNORMAL LOW (ref 36.0–46.0)
Hemoglobin: 8.8 g/dL — ABNORMAL LOW (ref 12.0–15.0)
Hemoglobin: 9.5 g/dL — ABNORMAL LOW (ref 12.0–15.0)
MCH: 29.5 pg (ref 26.0–34.0)
MCH: 28.9 pg (ref 26.0–34.0)
MCHC: 32.5 g/dL (ref 30.0–36.0)
MCHC: 31.9 g/dL (ref 30.0–36.0)
MCV: 90.9 fL (ref 80.0–100.0)
MCV: 90.6 fL (ref 80.0–100.0)
PLATELETS: 291 10*3/uL (ref 150–400)
Platelets: 262 10*3/uL (ref 150–400)
RBC: 2.98 MIL/uL — ABNORMAL LOW (ref 3.87–5.11)
RBC: 3.29 MIL/uL — AB (ref 3.87–5.11)
RDW: 14.8 % (ref 11.5–15.5)
RDW: 14.6 % (ref 11.5–15.5)
WBC: 6.7 10*3/uL (ref 4.0–10.5)
WBC: 6.9 10*3/uL (ref 4.0–10.5)
nRBC: 0 % (ref 0.0–0.2)
nRBC: 0 % (ref 0.0–0.2)

## 2018-09-20 LAB — FOLATE: Folate: 41.9 ng/mL (ref >5.9)

## 2018-09-20 LAB — RETICULOCYTES
Immature Retic Fract: 17.1 % — ABNORMAL HIGH (ref 2.3–15.9)
RBC.: 2.98 MIL/uL — ABNORMAL LOW (ref 3.87–5.11)
RETIC COUNT ABSOLUTE: 41.7 10*3/uL (ref 19.0–186.0)
Retic Ct Pct: 1.4 % (ref 0.4–3.1)

## 2018-09-20 LAB — IRON AND TIBC
Iron: 58 ug/dL (ref 28–170)
Saturation Ratios: 28 % (ref 10.4–31.8)
TIBC: 210 ug/dL — ABNORMAL LOW (ref 250–450)
UIBC: 152 ug/dL

## 2018-09-20 LAB — VITAMIN B12: Vitamin B-12: 264 pg/mL (ref 180–914)

## 2018-09-20 NOTE — Progress Notes (Signed)
[place] TEAM 1 - Stepdown/ICU TEAM  Aleira Burzinski  [id number]:[id number] DOB: 27-Feb-1926 DOA: 09/19/2018 PCP: [name], MD    Brief Narrative:  85 y.o. w/ a hx of hypertension, hyperlipidemia, GERD, and diverticulitis w/ prior diverticular GIB who presented with several episodes of maroon-colored rectal bleeding, with a large amount of blood clot. Patient c/o mild lower abdominal pain, but no nausea, vomiting.  In the ED she had a near syncope episode during an episode of rectal bleeding.  Significant Events: 2/24 admit   Subjective: The patient is alert and conversant.  She has not noted any further bleeding today.  She reports a good appetite and states she is eager to eat.  She denies shortness of breath chest pain nausea or vomiting.  Assessment & Plan:  LGIB - likely diverticular  Character of bleeding is consistent with diverticular source -patient has a history of same -bleeding appears to have ceased -slowly advance diet -monitor stool frequency and character  Acute blood loss anemia Hemoglobin currently appears to have reached a nadir -follow trend to assure ongoing bleeding no longer an issue  Mild Acute kidney injury  Most consistent with prerenal azotemia -resolved with volume expansion  HTN Blood pressure reasonably controlled at this time  GERD   DVT prophylaxis: SCDs Code Status: FULL CODE Family Communication: Spoke with daughter at bedside Disposition Plan: Advance diet -PT/OT -monitor hemoglobin -possible discharge approximately 48 hours  Consultants:  none  Antimicrobials:  none   Objective: Blood pressure 130/67, pulse 72, temperature 97.9 F (36.6 C), temperature source Oral, resp. rate 16, height 5\' 6"  (1.676 m), SpO2 99 %.  Intake/Output Summary (Last 24 hours) at 09/20/2018 1631 Last data filed at 09/20/2018 1134 Gross per 24 hour  Intake 1636.71 ml  Output -  Net 1636.71 ml   There were no vitals filed for this  visit.  Examination: General: No acute respiratory distress Lungs: Clear to auscultation bilaterally without wheezes or crackles Cardiovascular: Regular rate and rhythm without murmur gallop or rub normal S1 and S2 Abdomen: Nontender, nondistended, soft, bowel sounds positive, no rebound, no ascites, no appreciable mass Extremities: No significant cyanosis, clubbing, or edema bilateral lower extremities    CBC: Recent Labs  Lab 09/19/18 0235 09/19/18 0414 09/19/18 1443 09/19/18 2018 09/20/18 0409  WBC 9.5 13.1* 6.8 6.5 6.7  NEUTROABS 7.0  --   --   --   --   HGB 11.1* 10.6* 8.8* 10.0* 8.8*  HCT 37.6 35.3* 27.8* 31.7* 27.1*  MCV 95.7 92.9 91.7 91.6 90.9  PLT 300 300 259 PLATELET CLUMPS NOTED ON SMEAR, UNABLE TO ESTIMATE 262   Basic Metabolic Panel: Recent Labs  Lab 09/19/18 0235 09/19/18 0414 09/20/18 0409  NA 143 142 141  K 4.3 5.0 3.8  CL 110 110 111  CO2 21* 22 22  GLUCOSE 110* 108* 89  BUN 19 20 15   CREATININE 1.10* 1.02* 0.81  CALCIUM 9.1 9.0 8.4*   GFR: CrCl cannot be calculated (Unknown ideal weight.).  Liver Function Tests: Recent Labs  Lab 09/19/18 0235 09/20/18 0409  AST 23 18  ALT 14 12  ALKPHOS 50 46  BILITOT 0.1* 0.7  PROT 6.3* 5.4*  ALBUMIN 3.1* 2.7*    Coagulation Profile: Recent Labs  Lab 09/19/18 0235  INR 1.11    HbA1C: Hgb A1c MFr Bld  Date/Time Value Ref Range Status  07/31/2011 05:40 AM 5.4 <5.7 % Final    Comment:    (NOTE)  According to the ADA Clinical Practice Recommendations for 2011, when HbA1c is used as a screening test:  >=6.5%   Diagnostic of Diabetes Mellitus           (if abnormal result is confirmed) 5.7-6.4%   Increased risk of developing Diabetes Mellitus References:Diagnosis and Classification of Diabetes Mellitus,Diabetes Care,2011,34(Suppl 1):S62-S69 and Standards of Medical Care in         Diabetes - 2011,Diabetes Care,2011,34 (Suppl  1):S11-S61.    Scheduled Meds: . pantoprazole  40 mg Oral Q1200   Continuous Infusions: . sodium chloride 50 mL/hr at 09/20/18 0209     LOS: 0 days    Lonia Blood, MD [facility] Office  [phone] Pager - Text Page per Loretha Stapler as per below:  On-Call/Text Page:      [email or web address]  If 7PM-7AM, please contact night-coverage [email or web address] 09/20/2018, 4:31 PM

## 2018-09-21 DIAGNOSIS — K922 Gastrointestinal hemorrhage, unspecified: Secondary | ICD-10-CM | POA: Diagnosis present

## 2018-09-21 DIAGNOSIS — D509 Iron deficiency anemia, unspecified: Secondary | ICD-10-CM | POA: Diagnosis not present

## 2018-09-21 DIAGNOSIS — N179 Acute kidney failure, unspecified: Secondary | ICD-10-CM | POA: Diagnosis not present

## 2018-09-21 LAB — CBC
HCT: 30.8 % — ABNORMAL LOW (ref 36.0–46.0)
Hemoglobin: 9.6 g/dL — ABNORMAL LOW (ref 12.0–15.0)
MCH: 28.1 pg (ref 26.0–34.0)
MCHC: 31.2 g/dL (ref 30.0–36.0)
MCV: 90.1 fL (ref 80.0–100.0)
NRBC: 0 % (ref 0.0–0.2)
Platelets: 291 10*3/uL (ref 150–400)
RBC: 3.42 MIL/uL — AB (ref 3.87–5.11)
RDW: 14.5 % (ref 11.5–15.5)
WBC: 6.6 10*3/uL (ref 4.0–10.5)

## 2018-09-21 MED ORDER — ACETAMINOPHEN 325 MG PO TABS: 650.0000 mg | ORAL_TABLET | Freq: Four times a day (QID) | ORAL | Status: AC | PRN

## 2018-09-21 NOTE — Evaluation (Signed)
Occupational Therapy Evaluation Patient Details Name: Kimberly Rasmussen MRN: [id number] DOB: [birth year] Today's Date: 09/21/2018    History of Present Illness 85 y.o. w/ a hx of hypertension, hyperlipidemia, GERD, and diverticulitis w/ prior diverticular GIB who presented with several episodes of maroon-colored rectal bleeding, with a large amount of blood clot. Patient c/o mild lower abdominal pain, but no nausea, vomiting.   Clinical Impression   Pt admitted with the above diagnoses and presents with below problem list. Pt will benefit from continued acute OT to address the below listed deficits and maximize independence with basic ADLs prior to d/c home. PTA pt reports she was mod I with ADLs (walker), assist from family for transportation and IADLs. Pt reports she lives alone with family living locally and assisting as needed. No family present on eval to confirm. Pt currently set up to min guard with ADLs and functional mobility/transfers. Tolerated session well.      Follow Up Recommendations  No OT follow up;Supervision - Intermittent(OOB/mobiltiy)    Equipment Recommendations  None recommended by OT    Recommendations for Other Services PT consult     Precautions / Restrictions Precautions Precautions: Fall Restrictions Weight Bearing Restrictions: No      Mobility Bed Mobility Overal bed mobility: Modified Independent                Transfers Overall transfer level: Needs assistance Equipment used: Rolling walker (2 wheeled) Transfers: Sit to/from Stand Sit to Stand: Supervision;Min guard         General transfer comment: from EOB, to/from recliner. rw utilized once standing. No physical assist needed. S- min guard for safety.    Balance Overall balance assessment: Needs assistance         Standing balance support: Bilateral upper extremity supported;During functional activity;No upper extremity supported Standing balance-Leahy Scale: Fair Standing  balance comment: able to static stand to hold menu with out UE support. Also stood at sink to brush teeth with only occasional single extremity support. rw utilized for walking.                           ADL either performed or assessed with clinical judgement   ADL Overall ADL's : Needs assistance/impaired Eating/Feeding: Set up;Sitting   Grooming: Oral care;Supervision/safety;Standing   Upper Body Bathing: Set up;Sitting   Lower Body Bathing: Min guard;Sit to/from stand   Upper Body Dressing : Set up;Sitting   Lower Body Dressing: Min guard;Sit to/from stand   Toilet Transfer: Min guard;Ambulation;Comfort height toilet;Grab bars;RW   Toileting- Architect and Hygiene: Min guard;Sit to/from stand   Tub/ Shower Transfer: Min guard;Ambulation;Rolling walker;Shower seat   Functional mobility during ADLs: Min guard;Rolling walker General ADL Comments: Pt completed ambulation in the room navigating turns and obstacles. Noted to leave IV pole behind at times. Stood to complete oral care. Good toleration of tasks completed.      Vision         Perception     Praxis      Pertinent Vitals/Pain Pain Assessment: No/denies pain     Hand Dominance     Extremity/Trunk Assessment Upper Extremity Assessment Upper Extremity Assessment: Generalized weakness;Overall [facility] for tasks assessed   Lower Extremity Assessment Lower Extremity Assessment: Defer to PT evaluation       Communication Communication Communication: HOH(Hearing aid. Hears better in R ear.)   Cognition Arousal/Alertness: Awake/alert Behavior During Therapy: WFL for tasks assessed/performed Overall Cognitive Status: No family/caregiver present  to determine baseline cognitive functioning                                 General Comments: Walks away from IV pole at times. Some slow processing and STM deficits.   General Comments       Exercises     Shoulder Instructions       Home Living Family/patient expects to be discharged to:: Private residence Living Arrangements: Other relatives;Children Available Help at Discharge: Family Type of Home: House Home Access: Level entry     Home Layout: Two level;Able to live on main level with bedroom/bathroom     Bathroom Shower/Tub: Walk-in shower         Home Equipment: Environmental consultant - 2 wheels;Cane - single point;Shower seat   Additional Comments: Pt reports she lives alone and family comes in to help. Per chart review she lives with family. No family present to clarify.      Prior Functioning/Environment Level of Independence: Independent with assistive device(s)        Comments: Per pt report she ambulates with a rw. Family assists with IADLs and driving. She reports no assist with bathing/dressing.         OT Problem List: Decreased activity tolerance;Impaired balance (sitting and/or standing);Decreased knowledge of use of DME or AE;Decreased knowledge of precautions      OT Treatment/Interventions: Self-care/ADL training;Therapeutic exercise;Energy conservation;DME and/or AE instruction;Therapeutic activities;Patient/family education;Balance training    OT Goals(Current goals can be found in the care plan section) Acute Rehab OT Goals Patient Stated Goal: home  OT Goal Formulation: With patient Time For Goal Achievement: 09/28/18 Potential to Achieve Goals: Good ADL Goals Pt Will Perform Grooming: with modified independence;standing Pt Will Perform Lower Body Bathing: with modified independence;sit to/from stand Pt Will Perform Lower Body Dressing: with modified independence;sit to/from stand Pt Will Perform Tub/Shower Transfer: Shower transfer;with modified independence;ambulating;shower seat;3 in 1;rolling walker  OT Frequency: Min 2X/week   Barriers to D/C:            Co-evaluation              AM-PAC OT "6 Clicks" Daily Activity     Outcome Measure Help from another person  eating meals?: None Help from another person taking care of personal grooming?: None Help from another person toileting, which includes using toliet, bedpan, or urinal?: None Help from another person bathing (including washing, rinsing, drying)?: A Little Help from another person to put on and taking off regular upper body clothing?: None Help from another person to put on and taking off regular lower body clothing?: A Little 6 Click Score: 22   End of Session Equipment Utilized During Treatment: Rolling walker  Activity Tolerance: Patient tolerated treatment well Patient left: in chair;with call bell/phone within reach;with chair alarm set  OT Visit Diagnosis: Unsteadiness on feet (R26.81);Muscle weakness (generalized) (M62.81)                Time: 4734-0370 OT Time Calculation (min): 20 min Charges:  OT General Charges $OT Visit: 1 Visit OT Evaluation $OT Eval Low Complexity: 1 Low  Raynald Kemp, OT Acute Rehabilitation Services Pager: [phone] Office: [phone]   Pilar Grammes 09/21/2018, 9:36 AM

## 2018-09-21 NOTE — Care Management CC44 (Signed)
Condition Code 44 Documentation Completed  Patient Details  Name: Kimberly Rasmussen MRN: [id number] Date of Birth: Nov 29, 1925   Condition Code 44 given:  Yes Patient signature on Condition Code 44 notice:  Yes Documentation of 2 MD's agreement:  Yes Code 44 added to claim:  Yes    Lawerance Sabal, RN 09/21/2018, 10:48 AM

## 2018-09-21 NOTE — Evaluation (Signed)
Physical Therapy Evaluation Patient Details Name: Kimberly Rasmussen MRN: [id number] DOB: 1925-08-27 Today's Date: 09/21/2018   History of Present Illness  Pt is a 85 y/o female with PMH of hypertension, hyperlipidemia, GERD, and diverticulitis w/ prior diverticular GIB who presented with several episodes of maroon-colored rectal bleeding, with a large amount of blood clot. Patient c/o mild lower abdominal pain, but no nausea, vomiting.    Clinical Impression  Pt presented sitting OOB in recliner chair, awake and willing to participate in therapy session. Prior to admission, pt reported that she ambulated with a cane or RW PRN. Pt currently at min guard level overall for transfers and hallway ambulation with RW. No LOB or instability noted throughout. PT will continue to follow acutely to progress mobility as tolerated.    Follow Up Recommendations No PT follow up    Equipment Recommendations  None recommended by PT    Recommendations for Other Services       Precautions / Restrictions Precautions Precautions: Fall Restrictions Weight Bearing Restrictions: No      Mobility  Bed Mobility Overal bed mobility: Modified Independent             General bed mobility comments: pt OOB in recliner chair upon arrival  Transfers Overall transfer level: Needs assistance Equipment used: None;Rolling walker (2 wheeled) Transfers: Sit to/from Stand Sit to Stand: Min guard         General transfer comment: min guard for safety  Ambulation/Gait Ambulation/Gait assistance: Min guard Gait Distance (Feet): 200 Feet Assistive device: Rolling walker (2 wheeled) Gait Pattern/deviations: Step-through pattern;Decreased stride length Gait velocity: decreased   General Gait Details: pt with mild instability but no overt LOB or need for physical assistance, min guard for safety; pt unable to multitask while ambulating and had to stop/stand still to talk to PT  Stairs             Wheelchair Mobility    Modified Rankin (Stroke Patients Only)       Balance Overall balance assessment: Needs assistance Sitting-balance support: Feet supported Sitting balance-Leahy Scale: Good     Standing balance support: During functional activity Standing balance-Leahy Scale: Fair Standing balance comment: able to static stand to hold menu with out UE support. Also stood at sink to brush teeth with only occasional single extremity support. rw utilized for walking.                             Pertinent Vitals/Pain Pain Assessment: No/denies pain    Home Living Family/patient expects to be discharged to:: Private residence Living Arrangements: Other relatives;Children Available Help at Discharge: Family Type of Home: House Home Access: Level entry     Home Layout: Two level;Able to live on main level with bedroom/bathroom Home Equipment: Dan Humphreys - 2 wheels;Cane - single point;Shower seat Additional Comments: Pt reports she lives alone and family comes in to help. Per chart review she lives with family. No family present to clarify.    Prior Function Level of Independence: Independent with assistive device(s)         Comments: ambulates with RW PRN     Hand Dominance        Extremity/Trunk Assessment   Upper Extremity Assessment Upper Extremity Assessment: Defer to OT evaluation;Generalized weakness    Lower Extremity Assessment Lower Extremity Assessment: Generalized weakness       Communication   Communication: HOH  Cognition Arousal/Alertness: Awake/alert Behavior During Therapy: WFL for  tasks assessed/performed Overall Cognitive Status: Within Functional Limits for tasks assessed                                 General Comments: cognition not formally assessed and difficult to evaluate secondary to pt's significant HOH      General Comments      Exercises     Assessment/Plan    PT Assessment Patient needs  continued PT services  PT Problem List Decreased strength;Decreased balance;Decreased mobility;Decreased coordination       PT Treatment Interventions DME instruction;Stair training;Functional mobility training;Therapeutic activities;Gait training;Therapeutic exercise;Balance training;Neuromuscular re-education;Patient/family education    PT Goals (Current goals can be found in the Care Plan section)  Acute Rehab PT Goals Patient Stated Goal: "go home today" PT Goal Formulation: With patient Time For Goal Achievement: 10/05/18 Potential to Achieve Goals: Good    Frequency Min 3X/week   Barriers to discharge        Co-evaluation               AM-PAC PT "6 Clicks" Mobility  Outcome Measure Help needed turning from your back to your side while in a flat bed without using bedrails?: None Help needed moving from lying on your back to sitting on the side of a flat bed without using bedrails?: None Help needed moving to and from a bed to a chair (including a wheelchair)?: A Little Help needed standing up from a chair using your arms (e.g., wheelchair or bedside chair)?: A Little Help needed to walk in hospital room?: A Little Help needed climbing 3-5 steps with a railing? : A Little 6 Click Score: 20    End of Session   Activity Tolerance: Patient tolerated treatment well Patient left: in chair;with call bell/phone within reach;with chair alarm set Nurse Communication: Mobility status PT Visit Diagnosis: Other abnormalities of gait and mobility (R26.89);Muscle weakness (generalized) (M62.81)    Time: 1478-2956 PT Time Calculation (min) (ACUTE ONLY): 15 min   Charges:   PT Evaluation $PT Eval Low Complexity: 1 Low          Deborah Chalk, PT, DPT  Acute Rehabilitation Services Pager [phone] Office [phone]    Alessandra Bevels Ananda Sitzer 09/21/2018, 12:16 PM

## 2018-09-21 NOTE — Care Management Obs Status (Signed)
MEDICARE OBSERVATION STATUS NOTIFICATION   Patient Details  Name: Kimberly Rasmussen MRN: [id number] Date of Birth: 08/14/1925   Medicare Observation Status Notification Given:  Yes    Lawerance Sabal, RN 09/21/2018, 10:48 AM

## 2018-09-21 NOTE — Plan of Care (Signed)
In progress

## 2018-09-21 NOTE — Discharge Summary (Signed)
PATIENT DETAILS Name: Kimberly Rasmussen Age: 85 y.o. Sex: female Date of Birth: 11-21-1925 MRN: [id number]. Admitting Physician: Lorretta Harp, MD WUJ:[id number], Jasmine December, MD  Admit Date: 09/19/2018 Discharge date: 09/21/2018  Recommendations for Outpatient Follow-up:  1. Follow up with PCP in 1-2 weeks 2. Please obtain BMP/CBC in one week  Admitted From:  Home  Disposition: Home   Home Health: No  Equipment/Devices: None  Discharge Condition: Stable  CODE STATUS: FULL CODE  Diet recommendation:  Heart Healthy  Brief Summary: See H&P, Labs, Consult and Test reports for all details in brief, patient is a 85 year old female with a history of diverticular bleeding -presented with hematochezia.  See below for further details  Brief Hospital Course: Lower GI bleeding-likely diverticular in etiology: Resolved-no bleeding for 48 hours.  Stable for continued monitoring in the outpatient setting.  Acute blood loss anemia: Hemoglobin dropped to 9.6 by the day of discharge-but has been stable for the past few days.  No indication for transfusion.  GI bleeding has stopped.  Continue CBC monitoring in the outpatient setting  Mild AKI: Resolved-likely mild prerenal azotemia in the setting of GI bleeding.  Hypertension: Blood pressure seems to be controlled without the use of any antihypertensives-stable for continued monitoring in the outpatient setting  Procedures/Studies: None  Discharge Diagnoses:  Principal Problem:   GI bleed Active Problems:   Hypertension   Anemia   GERD (gastroesophageal reflux disease)   AKI (acute kidney injury) (HCC)   GIB (gastrointestinal bleeding)   Near syncope   Discharge Instructions:  Activity:  As tolerated  Discharge Instructions    Call MD for:   Complete by:  As directed    Bloody stools   Diet - low sodium heart healthy   Complete by:  As directed    Discharge instructions   Complete by:  As directed    Follow with  Primary MD  [name], MD in 1 week  Please get a complete blood count and chemistry panel checked by your Primary MD at your next visit, and again as instructed by your Primary MD.  Get Medicines reviewed and adjusted: Please take all your medications with you for your next visit with your Primary MD  Laboratory/radiological data: Please request your Primary MD to go over all hospital tests and procedure/radiological results at the follow up, please ask your Primary MD to get all Hospital records sent to his/her office.  In some cases, they will be blood work, cultures and biopsy results pending at the time of your discharge. Please request that your primary care M.D. follows up on these results.  Also Note the following: If you experience worsening of your admission symptoms, develop shortness of breath, life threatening emergency, suicidal or homicidal thoughts you must seek medical attention immediately by calling 911 or calling your MD immediately  if symptoms less severe.  You must read complete instructions/literature along with all the possible adverse reactions/side effects for all the Medicines you take and that have been prescribed to you. Take any new Medicines after you have completely understood and accpet all the possible adverse reactions/side effects.   Do not drive when taking Pain medications or sleeping medications (Benzodaizepines)  Do not take more than prescribed Pain, Sleep and Anxiety Medications. It is not advisable to combine anxiety,sleep and pain medications without talking with your primary care practitioner  Special Instructions: If you have smoked or chewed Tobacco  in the last 2 yrs please stop smoking, stop any regular Alcohol  and or any Recreational drug use.  Wear Seat belts while driving.  Please note: You were cared for by a hospitalist during your hospital stay. Once you are discharged, your primary care physician will handle any further medical  issues. Please note that NO REFILLS for any discharge medications will be authorized once you are discharged, as it is imperative that you return to your primary care physician (or establish a relationship with a primary care physician if you do not have one) for your post hospital discharge needs so that they can reassess your need for medications and monitor your lab values.   Increase activity slowly   Complete by:  As directed      Allergies as of 09/21/2018      Reactions   Amitriptyline Other (See Comments)   Hallucinations    Gabapentin Other (See Comments)   Hallucinations   Meloxicam Other (See Comments)   Hallucinations    Tramadol Other (See Comments)   Hallucinations       Medication List    STOP taking these medications   celecoxib 200 MG capsule Commonly known as:  CELEBREX     TAKE these medications   acetaminophen 325 MG tablet Commonly known as:  TYLENOL Take 2 tablets (650 mg total) by mouth every 6 (six) hours as needed for mild pain, fever or headache.   alendronate 70 MG tablet Commonly known as:  FOSAMAX Take 70 mg by mouth once a week. Take with a full glass of water on an empty stomach. Take on monday's   calcium-vitamin D 500-200 MG-UNIT tablet Commonly known as:  OSCAL WITH D Take 1 tablet by mouth daily with breakfast.   HYDROcodone-acetaminophen 5-325 MG tablet Commonly known as:  NORCO/VICODIN Take 1 tablet by mouth every 6 (six) hours as needed for moderate pain.   MULTIVITAMIN PO Take 1 tablet by mouth daily.      Follow-up Information    [name], MD. Schedule an appointment as soon as possible for a visit in 1 week(s).   Specialty:  Family Medicine Contact information: [address] [place] [address] [phone]          Allergies  Allergen Reactions  . Amitriptyline Other (See Comments)    Hallucinations   . Gabapentin Other (See Comments)    Hallucinations   . Meloxicam Other (See  Comments)    Hallucinations   . Tramadol Other (See Comments)    Hallucinations     Consultations:   None   Other Procedures/Studies:  No results found.   TODAY-DAY OF DISCHARGE:  Subjective:   Derinda Gillins today has no headache,no chest abdominal pain,no new weakness tingling or numbness, feels much better wants to go home today.   Objective:   Blood pressure 139/61, pulse 74, temperature 97.8 F (36.6 C), temperature source Oral, resp. rate 16, height 5\' 6"  (1.676 m), SpO2 100 %.  Intake/Output Summary (Last 24 hours) at 09/21/2018 0837 Last data filed at 09/20/2018 1600 Gross per 24 hour  Intake 843.37 ml  Output -  Net 843.37 ml   There were no vitals filed for this visit.  Exam: Awake Alert, Oriented *3, No new F.N deficits, Normal affect Spencer.AT,PERRAL Supple Neck,No JVD, No cervical lymphadenopathy appriciated.  Symmetrical Chest wall movement, Good air movement bilaterally, CTAB RRR,No Gallops,Rubs or new Murmurs, No Parasternal Heave +ve B.Sounds, Abd Soft, Non tender, No organomegaly appriciated, No rebound -guarding or rigidity. No Cyanosis, Clubbing or edema, No new Rash  or bruise   PERTINENT RADIOLOGIC STUDIES: No results found.   PERTINENT LAB RESULTS: CBC: Recent Labs    09/20/18 1819 09/21/18 0351  WBC 6.9 6.6  HGB 9.5* 9.6*  HCT 29.8* 30.8*  PLT 291 291   CMET CMP     Component Value Date/Time   NA 141 09/20/2018 0409   K 3.8 09/20/2018 0409   CL 111 09/20/2018 0409   CO2 22 09/20/2018 0409   GLUCOSE 89 09/20/2018 0409   BUN 15 09/20/2018 0409   CREATININE 0.81 09/20/2018 0409   CALCIUM 8.4 (L) 09/20/2018 0409   PROT 5.4 (L) 09/20/2018 0409   ALBUMIN 2.7 (L) 09/20/2018 0409   AST 18 09/20/2018 0409   ALT 12 09/20/2018 0409   ALKPHOS 46 09/20/2018 0409   BILITOT 0.7 09/20/2018 0409   GFRNONAA >60 09/20/2018 0409   GFRAA >60 09/20/2018 0409    GFR CrCl cannot be calculated (Unknown ideal weight.). No results for  input(s): LIPASE, AMYLASE in the last 72 hours. No results for input(s): CKTOTAL, CKMB, CKMBINDEX, TROPONINI in the last 72 hours. Invalid input(s): POCBNP No results for input(s): DDIMER in the last 72 hours. No results for input(s): HGBA1C in the last 72 hours. No results for input(s): CHOL, HDL, LDLCALC, TRIG, CHOLHDL, LDLDIRECT in the last 72 hours. No results for input(s): TSH, T4TOTAL, T3FREE, THYROIDAB in the last 72 hours.  Invalid input(s): FREET3 Recent Labs    09/20/18 0409  VITAMINB12 264  FOLATE 41.9  FERRITIN 100  TIBC 210*  IRON 58  RETICCTPCT 1.4   Coags: Recent Labs    09/19/18 0235  INR 1.11   Microbiology: No results found for this or any previous visit (from the past 240 hour(s)).  FURTHER DISCHARGE INSTRUCTIONS:  Get Medicines reviewed and adjusted: Please take all your medications with you for your next visit with your Primary MD  Laboratory/radiological data: Please request your Primary MD to go over all hospital tests and procedure/radiological results at the follow up, please ask your Primary MD to get all Hospital records sent to his/her office.  In some cases, they will be blood work, cultures and biopsy results pending at the time of your discharge. Please request that your primary care M.D. goes through all the records of your hospital data and follows up on these results.  Also Note the following: If you experience worsening of your admission symptoms, develop shortness of breath, life threatening emergency, suicidal or homicidal thoughts you must seek medical attention immediately by calling 911 or calling your MD immediately  if symptoms less severe.  You must read complete instructions/literature along with all the possible adverse reactions/side effects for all the Medicines you take and that have been prescribed to you. Take any new Medicines after you have completely understood and accpet all the possible adverse reactions/side effects.    Do not drive when taking Pain medications or sleeping medications (Benzodaizepines)  Do not take more than prescribed Pain, Sleep and Anxiety Medications. It is not advisable to combine anxiety,sleep and pain medications without talking with your primary care practitioner  Special Instructions: If you have smoked or chewed Tobacco  in the last 2 yrs please stop smoking, stop any regular Alcohol  and or any Recreational drug use.  Wear Seat belts while driving.  Please note: You were cared for by a hospitalist during your hospital stay. Once you are discharged, your primary care physician will handle any further medical issues. Please note that NO REFILLS for any  discharge medications will be authorized once you are discharged, as it is imperative that you return to your primary care physician (or establish a relationship with a primary care physician if you do not have one) for your post hospital discharge needs so that they can reassess your need for medications and monitor your lab values.  Total Time spent coordinating discharge including counseling, education and face to face time equals 25 minutes.  SignedJeoffrey Massed 09/21/2018 8:37 AM

## 2018-09-21 NOTE — Progress Notes (Signed)
Nsg Discharge Note  Admit Date:  09/19/2018 Discharge date: 09/21/2018   Kimberly Rasmussen to be D/C'd Home per MD order.  AVS completed.  Copy for chart, and copy for patient signed, and dated. Patient/caregiver able to verbalize understanding.  Discharge Medication: Allergies as of 09/21/2018      Reactions   Amitriptyline Other (See Comments)   Hallucinations    Gabapentin Other (See Comments)   Hallucinations   Meloxicam Other (See Comments)   Hallucinations    Tramadol Other (See Comments)   Hallucinations       Medication List    STOP taking these medications   celecoxib 200 MG capsule Commonly known as:  CELEBREX     TAKE these medications   acetaminophen 325 MG tablet Commonly known as:  TYLENOL Take 2 tablets (650 mg total) by mouth every 6 (six) hours as needed for mild pain, fever or headache.   alendronate 70 MG tablet Commonly known as:  FOSAMAX Take 70 mg by mouth once a week. Take with a full glass of water on an empty stomach. Take on monday's   calcium-vitamin D 500-200 MG-UNIT tablet Commonly known as:  OSCAL WITH D Take 1 tablet by mouth daily with breakfast.   HYDROcodone-acetaminophen 5-325 MG tablet Commonly known as:  NORCO/VICODIN Take 1 tablet by mouth every 6 (six) hours as needed for moderate pain.   MULTIVITAMIN PO Take 1 tablet by mouth daily.       Discharge Assessment: Vitals:   09/20/18 2108 09/21/18 0623  BP: (!) 172/72 139/61  Pulse: 79 74  Resp:  16  Temp: 98.5 F (36.9 C) 97.8 F (36.6 C)  SpO2:  100%   Skin clean, dry and intact without evidence of skin break down, no evidence of skin tears noted. IV catheter discontinued intact. Site without signs and symptoms of complications - no redness or edema noted at insertion site, patient denies c/o pain - only slight tenderness at site.  Dressing with slight pressure applied.  D/c Instructions-Education: Discharge instructions given to patient/family with verbalized  understanding. D/c education completed with patient/family including follow up instructions, medication list, d/c activities limitations if indicated, with other d/c instructions as indicated by MD - patient able to verbalize understanding, all questions fully answered. Patient instructed to return to ED, call 911, or call MD for any changes in condition.  Patient escorted via WC, and D/C home via private auto.  Lyndal Pulley, RN 09/21/2018 9:04 AM

## 2019-06-07 ENCOUNTER — Other Ambulatory Visit: Payer: Self-pay

## 2019-06-07 ENCOUNTER — Ambulatory Visit: Payer: Medicare Other

## 2019-06-07 ENCOUNTER — Ambulatory Visit: Payer: Medicare Other | Admitting: Podiatry

## 2019-06-07 DIAGNOSIS — M2042 Other hammer toe(s) (acquired), left foot: Secondary | ICD-10-CM | POA: Diagnosis not present

## 2019-06-07 DIAGNOSIS — M21611 Bunion of right foot: Secondary | ICD-10-CM

## 2019-06-07 DIAGNOSIS — M21612 Bunion of left foot: Secondary | ICD-10-CM

## 2019-06-12 NOTE — Progress Notes (Signed)
   Subjective: 85 y.o. female presents today as a new patient with a chief complaint of painful bunions notes to the bilateral feet that became symptomatic about 8 months ago. She also notes painful callus lesions to the bilateral feet. Walking and wearing shoes increases her pain. She has not had any treatment for the symptoms. Patient is here for further evaluation and treatment.   Past Medical History:  Diagnosis Date  . AKI (acute kidney injury) ([place]) 09/19/2018  . Arthritis   . Diverticulitis   . Diverticulosis   . Dyslipidemia   . Dysrhythmia    irregular heart beat  . HTN (hypertension)   . Jaundice, obstructive, intrahepatic    s/p ERCP, lithotripsy, and stone removal 01/2011, 02/2011  . PONV (postoperative nausea and vomiting)    N&V when awakening from anesthesia  . Shortness of breath    saw cardiologist for this  . UTI (urinary tract infection)    history of UTI      Objective: Physical Exam General: The patient is alert and oriented x3 in no acute distress.  Dermatology: Hyperkeratotic lesion(s) present on the bilateral feet. Pain on palpation with a central nucleated core noted. Skin is cool, dry and supple bilateral lower extremities. Negative for open lesions or macerations.  Vascular: Palpable pedal pulses bilaterally. No edema or erythema noted. Capillary refill within normal limits.  Neurological: Epicritic and protective threshold grossly intact bilaterally.   Musculoskeletal Exam: Clinical evidence of bunion deformity noted to the respective foot. There is moderate pain on palpation range of motion of the first MPJ. Lateral deviation of the hallux noted consistent with hallux abductovalgus.  Assessment: 1. HAV w/ bunion deformity bilateral 2. Pre-ulcerative callus lesions noted to the bilateral feet x 4    Plan of Care:  1. Patient was evaluated.  2. Excisional debridement of keratotic lesion(s) using a chisel blade was performed without incident. Light  dressing applied.  3. Darco toe splint dispensed.  4. Return to clinic as needed.       Edrick Kins, DPM [facility]  Dr. Edrick Kins, [address]                                        [place], [place] [address]                Office [phone]  Fax [phone]

## 2019-10-17 ENCOUNTER — Encounter: Payer: Self-pay | Admitting: Neurology

## 2019-12-13 NOTE — Progress Notes (Signed)
NEUROLOGY CONSULTATION NOTE  Kimberly Rasmussen MRN: [id number] DOB: [birth year]  Referring provider: Jonathon Jordan, MD Primary care provider: Jonathon Jordan, MD  Reason for consult:  Balance disorder  HISTORY OF PRESENT ILLNESS: Kimberly Rasmussen is a 86 year old female with osteoarthritis, HTN, and dyslipidemia who presents for balance disorder.  She is accompanied by her daughter who supplements history.  Collateral history also obtained from referring provider's note.  She has had some balance problems for over a year, maybe slightly since onset.  She feels wobbly on her feet.  Sometimes she seems to drag her feet.  She has some non-radiating bilateral low back pain but no associated numbness or pain in legs.  No dizziness.  Sometimes she sees double vision but recent eye exam was unremarkable.  She had a couple of falls over the past 2 to 3 months.  Her bed was too high and she fell off trying to climb in bed.  Another time, she fell on a ladder.  She used to use a cane once in awhile but has started using a walker over the past 8 months.    During the pandemic, she has not been more sedentary than usual.  She has gone to PT, which has been helpful.  She has degenerative disc disease of the lumbar spine.  X-ray of lumbar spine from 03/15/2013 personally reviewed showed diffuse osteopenia and degenerative joint disease as well as degenerative disease disease at L5-S1 level.  She previously had an MRI of brain with and without contrast on 04/11/2018, which was personally reviewed and showed mild generalized atrophy and chronic small vessel ischemic changes.  PAST MEDICAL HISTORY: Past Medical History:  Diagnosis Date  . AKI (acute kidney injury) ([place]) 09/19/2018  . Arthritis   . Diverticulitis   . Diverticulosis   . Dyslipidemia   . Dysrhythmia    irregular heart beat  . HTN (hypertension)   . Jaundice, obstructive, intrahepatic    s/p ERCP, lithotripsy, and stone removal 01/2011,  02/2011  . PONV (postoperative nausea and vomiting)    N&V when awakening from anesthesia  . Shortness of breath    saw cardiologist for this  . UTI (urinary tract infection)    history of UTI    PAST SURGICAL HISTORY: Past Surgical History:  Procedure Laterality Date  . CHOLECYSTECTOMY     removal of gallstones x4 (?ERCP)  . ERCP     with lithotripsy and gall stone removal 2012  . ESOPHAGOGASTRODUODENOSCOPY N/A 04/11/2015   Procedure: ESOPHAGOGASTRODUODENOSCOPY (EGD);  Surgeon: Carol Ada, MD;  Location: [facility] ENDOSCOPY;  Service: Endoscopy;  Laterality: N/A;  . FOOT SURGERY     x2  . KNEE SURGERY     LEFT KNEE  . TOTAL ABDOMINAL HYSTERECTOMY    . TOTAL KNEE ARTHROPLASTY     RIGHT KNEE  . TOTAL KNEE ARTHROPLASTY  07/13/2011   Procedure: TOTAL KNEE ARTHROPLASTY;  Surgeon: Kerin Salen;  Location: [facility];  Service: Orthopedics;  Laterality: Left;  Left Total Knee Arthroplasty with Hardware Removal     MEDICATIONS: Current Outpatient Medications on File Prior to Visit  Medication Sig Dispense Refill  . acetaminophen (TYLENOL) 325 MG tablet Take 2 tablets (650 mg total) by mouth every 6 (six) hours as needed for mild pain, fever or headache.    . alendronate (FOSAMAX) 70 MG tablet Take 70 mg by mouth once a week. Take with a full glass of water on an empty stomach. Take on monday's    .  calcium-vitamin D (OSCAL WITH D) 500-200 MG-UNIT per tablet Take 1 tablet by mouth daily with breakfast.    . ciprofloxacin (CIPRO) 250 MG tablet Take 250 mg by mouth 2 (two) times daily.    Marland Kitchen HYDROcodone-acetaminophen (NORCO/VICODIN) 5-325 MG per tablet Take 1 tablet by mouth every 6 (six) hours as needed for moderate pain.    . Multiple Vitamins-Minerals (MULTIVITAMIN PO) Take 1 tablet by mouth daily.    . nitrofurantoin (MACRODANTIN) 100 MG capsule Take 100 mg by mouth 2 (two) times daily.     No current facility-administered medications on file prior to visit.    ALLERGIES: Allergies   Allergen Reactions  . Amitriptyline Other (See Comments)    Hallucinations   . Gabapentin Other (See Comments)    Hallucinations   . Meloxicam Other (See Comments)    Hallucinations   . Tramadol Other (See Comments)    Hallucinations     FAMILY HISTORY: Family History  Problem Relation Age of Onset  . Pneumonia Mother   . Lung cancer Sister   . Hypertension Maternal Grandmother   . Stroke Maternal Grandmother    SOCIAL HISTORY: Social History   Socioeconomic History  . Marital status: Widowed    Spouse name: Not on file  . Number of children: Not on file  . Years of education: Not on file  . Highest education level: Not on file  Occupational History  . Not on file  Tobacco Use  . Smoking status: Former Smoker    Packs/day: 0.50    Years: 40.00    Pack years: 20.00    Types: Cigarettes    Quit date: 07/05/1996    Years since quitting: 23.4  . Smokeless tobacco: Never Used  Substance and Sexual Activity  . Alcohol use: Yes    Alcohol/week: 1.0 standard drinks    Types: 1 Glasses of wine per week    Comment: occasional  . Drug use: No  . Sexual activity: Never  Other Topics Concern  . Not on file  Social History Narrative  . Not on file   Social Determinants of Health   Financial Resource Strain:   . Difficulty of Paying Living Expenses:   Food Insecurity:   . Worried About Programme researcher, broadcasting/film/video in the Last Year:   . Barista in the Last Year:   Transportation Needs:   . Freight forwarder (Medical):   Marland Kitchen Lack of Transportation (Non-Medical):   Physical Activity:   . Days of Exercise per Week:   . Minutes of Exercise per Session:   Stress:   . Feeling of Stress :   Social Connections:   . Frequency of Communication with Friends and Family:   . Frequency of Social Gatherings with Friends and Family:   . Attends Religious Services:   . Active Member of Clubs or Organizations:   . Attends Banker Meetings:   Marland Kitchen Marital Status:    Intimate Partner Violence:   . Fear of Current or Ex-Partner:   . Emotionally Abused:   Marland Kitchen Physically Abused:   . Sexually Abused:     PHYSICAL EXAM: Blood pressure 120/70, pulse 88, height 5\' 5"  (1.651 m), weight 149 lb 14.4 oz (68 kg), SpO2 98 %. General: No acute distress.  Patient appears well-groomed.  Head:  Normocephalic/atraumatic Eyes:  fundi examined but not visualized Neck: supple, no paraspinal tenderness, full range of motion Back: No paraspinal tenderness Heart: regular rate and rhythm Lungs: Clear to  auscultation bilaterally. Vascular: No carotid bruits. Neurological Exam: Mental status: alert and oriented to person, place, and time, recent and remote memory intact, fund of knowledge intact, attention and concentration intact, speech fluent and not dysarthric, language intact. Cranial nerves: CN I: not tested CN II: pupils equal, round and reactive to light, visual fields intact CN III, IV, VI:  full range of motion, no nystagmus, no ptosis CN V: facial sensation intact CN VII: upper and lower face symmetric CN VIII: hearing intact CN IX, X: gag intact, uvula midline CN XI: sternocleidomastoid and trapezius muscles intact CN XII: tongue midline Bulk & Tone: normal, no fasciculations. Motor:  4-/5 bilateral hip flexion, otherwise 5/5. Sensation:  Pinprick sensation intact and vibration sensation reduced in left first toe. Deep Tendon Reflexes:  2+ throughout, toes downgoing. Finger to nose testing:  Without dysmetria.  Heel to shin:  Without dysmetria.   Gait:  Wide-based gait, short stride.  Romberg with sway.  IMPRESSION: Unsteady gait/poor balance.  No obvious red flags on exam.  No signs of myelopathy.  Exam findings not consistent with Parkinson's disease.  She endorses occasional double vision but she appears to have full extraocular movements and given age, PSP unlikely.  No significant neuropathy in feet.  No radicular pain but may consider lumbar spinal  stenosis.  Also consider cerebrovascular disease and arthritis as other causes.  PLAN: MRI of brain to evaluate for secondary intracranial etiology.  Further recommendations pending results.  If unremarkable, supportive care.    Thank you for allowing me to take part in the care of this patient.  Shon Millet, DO  CC: [name], MD

## 2019-12-14 ENCOUNTER — Encounter: Payer: Self-pay | Admitting: Neurology

## 2019-12-14 ENCOUNTER — Other Ambulatory Visit: Payer: Self-pay

## 2019-12-14 ENCOUNTER — Ambulatory Visit: Payer: Medicare Other | Admitting: Neurology

## 2019-12-14 VITALS — BP 120/70 | HR 88 | Ht 65.0 in | Wt 149.9 lb

## 2019-12-14 DIAGNOSIS — R2681 Unsteadiness on feet: Secondary | ICD-10-CM

## 2019-12-14 DIAGNOSIS — W19XXXD Unspecified fall, subsequent encounter: Secondary | ICD-10-CM

## 2019-12-14 DIAGNOSIS — H532 Diplopia: Secondary | ICD-10-CM | POA: Diagnosis not present

## 2019-12-14 NOTE — Patient Instructions (Addendum)
Will check MRI of brain without contrast. We have sent a referral to [facility] for your MRI and they will call you directly to schedule your appointment. They are located at [address] If you need to contact them directly please call [phone].  Further recommendations pending results. 

## 2020-01-15 ENCOUNTER — Other Ambulatory Visit: Payer: Self-pay

## 2020-01-15 ENCOUNTER — Emergency Department (HOSPITAL_BASED_OUTPATIENT_CLINIC_OR_DEPARTMENT_OTHER)
Admission: EM | Admit: 2020-01-15 | Discharge: 2020-01-15 | Disposition: A | Discharge status: Home / Self Care | Payer: Medicare Other | Attending: Emergency Medicine | Admitting: Emergency Medicine

## 2020-01-15 DIAGNOSIS — T391X1A Poisoning by 4-Aminophenol derivatives, accidental (unintentional), initial encounter: Secondary | ICD-10-CM | POA: Diagnosis not present

## 2020-01-15 DIAGNOSIS — Z96651 Presence of right artificial knee joint: Secondary | ICD-10-CM | POA: Insufficient documentation

## 2020-01-15 DIAGNOSIS — I10 Essential (primary) hypertension: Secondary | ICD-10-CM | POA: Insufficient documentation

## 2020-01-15 DIAGNOSIS — Z87891 Personal history of nicotine dependence: Secondary | ICD-10-CM | POA: Diagnosis not present

## 2020-01-15 DIAGNOSIS — R296 Repeated falls: Secondary | ICD-10-CM | POA: Diagnosis present

## 2020-01-15 LAB — CBC WITH DIFFERENTIAL/PLATELET
Abs Immature Granulocytes: 0.05 10*3/uL (ref 0.00–0.07)
Basophils Absolute: 0 10*3/uL (ref 0.0–0.1)
Basophils Relative: 0 %
Eosinophils Absolute: 0.1 10*3/uL (ref 0.0–0.5)
Eosinophils Relative: 1 %
HCT: 40 % (ref 36.0–46.0)
Hemoglobin: 12.8 g/dL (ref 12.0–15.0)
Immature Granulocytes: 1 %
Lymphocytes Relative: 22 %
Lymphs Abs: 2.1 10*3/uL (ref 0.7–4.0)
MCH: 28.9 pg (ref 26.0–34.0)
MCHC: 32 g/dL (ref 30.0–36.0)
MCV: 90.3 fL (ref 80.0–100.0)
Monocytes Absolute: 0.7 10*3/uL (ref 0.1–1.0)
Monocytes Relative: 7 %
Neutro Abs: 6.6 10*3/uL (ref 1.7–7.7)
Neutrophils Relative %: 69 %
Platelets: 383 10*3/uL (ref 150–400)
RBC: 4.43 MIL/uL (ref 3.87–5.11)
RDW: 15.1 % (ref 11.5–15.5)
WBC: 9.5 10*3/uL (ref 4.0–10.5)
nRBC: 0 % (ref 0.0–0.2)

## 2020-01-15 LAB — COMPREHENSIVE METABOLIC PANEL
ALT: 12 U/L (ref 0–44)
AST: 16 U/L (ref 15–41)
Albumin: 3.7 g/dL (ref 3.5–5.0)
Alkaline Phosphatase: 59 U/L (ref 38–126)
Anion gap: 11 (ref 5–15)
BUN: 20 mg/dL (ref 8–23)
CO2: 21 mmol/L — ABNORMAL LOW (ref 22–32)
Calcium: 9.2 mg/dL (ref 8.9–10.3)
Chloride: 107 mmol/L (ref 98–111)
Creatinine, Ser: 0.72 mg/dL (ref 0.44–1.00)
GFR calc Af Amer: >60 mL/min (ref >60)
GFR calc non Af Amer: >60 mL/min (ref >60)
Glucose, Bld: 93 mg/dL (ref 70–99)
Potassium: 4.1 mmol/L (ref 3.5–5.1)
Sodium: 139 mmol/L (ref 135–145)
Total Bilirubin: 0.4 mg/dL (ref 0.3–1.2)
Total Protein: 7.4 g/dL (ref 6.5–8.1)

## 2020-01-15 NOTE — ED Notes (Signed)
Attempted lab draw x 1; unable to obtain.

## 2020-01-15 NOTE — Discharge Instructions (Addendum)
No evidence of any liver damage from the accidental too much Tylenol from few days ago.  Keep your appointment for the MRI that scheduled for June 24.  Return for any new or worse symptoms.

## 2020-01-15 NOTE — ED Triage Notes (Addendum)
Pt c/o pain to left LE "since I fell a few months ago"-steady gait with own walker-daughter states on 6/19 pt was c/o HA, slurred speech, pt unable to sit up "like she was dead weight"-she called EMS and by the time she returned pt was "back to normal"/baseline-also that she has c/o pain to right flank x 4 days with possible shingle hx pain however pt states she had had flank pain since a fall

## 2020-01-15 NOTE — ED Provider Notes (Signed)
[facility] EMERGENCY DEPARTMENT Provider Note   CSN: [id number] Arrival date & time: 01/15/20  1339     History Chief Complaint  Patient presents with  . Multiple c/o    Kimberly Rasmussen is a 86 y.o. female.  Patient presents with caregiver.  Patient's been being evaluated for some frequent falls being a little bit off balance.  Has an MRI scheduled by her neurologist for June 25.  It sounds as if there was a possible TIA on Saturday.  I offered to go and do head CT today but able to do that since the MRI was coming up.  Their main concern was that patient was sort of taking hydrocodone and Tylenol together they have stopped that since Saturday.  And they were little concerned about whether there was any liver damage.  Also what occurred on Saturday patient did have a complaint of headache and some slurred speech and was a little bit out of it but this only lasted about 3 minutes.  Then resolved there is been no recurrent problems.  Patient's had the complaint of right flank pain actually for a good while.  And there have been like a mention several falls but none recently.  She is also had some of this flank pain going way back into the fall.  They really are not here today with concerns of any injuries from the falls.  Patient's neurologist was concerned about some acute intracranial processes developing.  So I have ordered the MRI actually ordered that back on May 20.        Past Medical History:  Diagnosis Date  . AKI (acute kidney injury) ([place]) 09/19/2018  . Arthritis   . Diverticulitis   . Diverticulosis   . Dyslipidemia   . Dysrhythmia    irregular heart beat  . HTN (hypertension)   . Jaundice, obstructive, intrahepatic    s/p ERCP, lithotripsy, and stone removal 01/2011, 02/2011  . PONV (postoperative nausea and vomiting)    N&V when awakening from anesthesia  . Shortness of breath    saw cardiologist for this  . UTI (urinary tract infection)    history of  UTI    Patient Active Problem List   Diagnosis Date Noted  . Lower GI bleeding 09/21/2018  . GERD (gastroesophageal reflux disease) 09/19/2018  . AKI (acute kidney injury) ([place]) 09/19/2018  . GIB (gastrointestinal bleeding) 09/19/2018  . Near syncope   . Gastrointestinal hemorrhage associated with intestinal diverticulosis   . Diverticulitis 06/01/2013  . GI bleed 07/31/2011  . Anemia 07/31/2011  . Diverticulosis 07/31/2011  . Degenerative arthritis of left knee 07/13/2011  . Hypertension 10/23/2010  . Hyperlipidemia 10/23/2010    Past Surgical History:  Procedure Laterality Date  . CHOLECYSTECTOMY     removal of gallstones x4 (?ERCP)  . ERCP     with lithotripsy and gall stone removal 2012  . ESOPHAGOGASTRODUODENOSCOPY N/A 04/11/2015   Procedure: ESOPHAGOGASTRODUODENOSCOPY (EGD);  Surgeon: Carol Ada, MD;  Location[facility];  Service: Endoscopy;  Laterality: N/A;  . FOOT SURGERY     x2  . KNEE SURGERY     LEFT KNEE  . TOTAL ABDOMINAL HYSTERECTOMY    . TOTAL KNEE ARTHROPLASTY     RIGHT KNEE  . TOTAL KNEE ARTHROPLASTY  07/13/2011   Procedure: TOTAL KNEE ARTHROPLASTY;  Surgeon: Kerin Salen;  Location: [facility];  Service: Orthopedics;  Laterality: Left;  Left Total Knee Arthroplasty with Hardware Removal      OB History  No obstetric history on file.     Family History  Problem Relation Age of Onset  . Pneumonia Mother   . Lung cancer Sister   . Hypertension Maternal Grandmother   . Stroke Maternal Grandmother     Social History   Tobacco Use  . Smoking status: Former Smoker    Packs/day: 0.50    Years: 40.00    Pack years: 20.00    Types: Cigarettes    Quit date: 07/05/1996    Years since quitting: 23.5  . Smokeless tobacco: Never Used  Substance Use Topics  . Alcohol use: Yes    Alcohol/week: 1.0 standard drink    Types: 1 Glasses of wine per week    Comment: occasional  . Drug use: No    Home Medications Prior to Admission medications    Medication Sig Start Date End Date Taking? Authorizing Provider  acetaminophen (TYLENOL) 325 MG tablet Take 2 tablets (650 mg total) by mouth every 6 (six) hours as needed for mild pain, fever or headache. 09/21/18   Ghimire, Werner Lean, MD  alendronate (FOSAMAX) 70 MG tablet Take 70 mg by mouth once a week. Take with a full glass of water on an empty stomach. Take on monday's    [provider]  calcium-vitamin D (OSCAL WITH D) 500-200 MG-UNIT per tablet Take 1 tablet by mouth daily with breakfast.    [provider]  HYDROcodone-acetaminophen (NORCO/VICODIN) 5-325 MG per tablet Take 1 tablet by mouth every 6 (six) hours as needed for moderate pain.    [provider]  Multiple Vitamins-Minerals (MULTIVITAMIN PO) Take 1 tablet by mouth daily.    [provider]  nitrofurantoin (MACRODANTIN) 100 MG capsule Take 100 mg by mouth 2 (two) times daily. 03/08/19   [provider]    Allergies    Amitriptyline, Gabapentin, Meloxicam, and Tramadol  Review of Systems   Review of Systems  Constitutional: Negative for chills and fever.  HENT: Negative for congestion, rhinorrhea and sore throat.   Eyes: Negative for visual disturbance.  Respiratory: Negative for cough and shortness of breath.   Cardiovascular: Negative for chest pain and leg swelling.  Gastrointestinal: Negative for abdominal pain, diarrhea, nausea and vomiting.  Genitourinary: Positive for flank pain. Negative for dysuria.  Musculoskeletal: Negative for back pain and neck pain.  Skin: Negative for rash.  Neurological: Positive for dizziness and speech difficulty. Negative for light-headedness and headaches.  Hematological: Does not bruise/bleed easily.  Psychiatric/Behavioral: Negative for confusion.    Physical Exam Updated Vital Signs BP (!) 194/97   Pulse 74   Temp 98.8 F (37.1 C) (Oral)   Resp 20   Ht 1.524 m (5')   Wt 66.7 kg   SpO2 99%   BMI 28.71 kg/m   Physical  Exam Vitals and nursing note reviewed.  Constitutional:      General: She is not in acute distress.    Appearance: She is well-developed. She is not ill-appearing.  HENT:     Head: Normocephalic and atraumatic.     Mouth/Throat:     Mouth: Mucous membranes are moist.  Eyes:     Extraocular Movements: Extraocular movements intact.     Conjunctiva/sclera: Conjunctivae normal.     Pupils: Pupils are equal, round, and reactive to light.  Cardiovascular:     Rate and Rhythm: Normal rate and regular rhythm.     Heart sounds: No murmur heard.   Pulmonary:     Effort: Pulmonary effort is normal.  No respiratory distress.     Breath sounds: Normal breath sounds.  Abdominal:     Palpations: Abdomen is soft.     Tenderness: There is no abdominal tenderness.  Musculoskeletal:        General: No swelling. Normal range of motion.     Cervical back: Normal range of motion and neck supple.  Skin:    General: Skin is warm and dry.     Capillary Refill: Capillary refill takes less than 2 seconds.  Neurological:     General: No focal deficit present.     Mental Status: She is alert. Mental status is at baseline.     Cranial Nerves: No cranial nerve deficit.     Sensory: No sensory deficit.     Motor: No weakness.     Coordination: Coordination normal.     ED Results / Procedures / Treatments   Labs (all labs ordered are listed, but only abnormal results are displayed) Labs Reviewed  COMPREHENSIVE METABOLIC PANEL - Abnormal; Notable for the following components:      Result Value   CO2 21 (*)    All other components within normal limits  CBC WITH DIFFERENTIAL/PLATELET  CBC WITH DIFFERENTIAL/PLATELET    EKG None  Radiology No results found.  Procedures Procedures (including critical care time)  Medications Ordered in ED Medications - No data to display  ED Course  I have reviewed the triage vital signs and the nursing notes.  Pertinent labs & imaging results that were  available during my care of the patient were reviewed by me and considered in my medical decision making (see chart for details).    MDM Rules/Calculators/A&P                          Patient's neuro exam without any significant findings.  Did not ambulate her.  Patient actually is well preserved for her age.  Their main concern came down to worried about liver stuff so CBC and complete metabolic panel was done.  No significant liver abnormalities.  Based on the fact that this accidental taking too much Tylenol count has been corrected since the weekend.  Not see a reason to check the Tylenol level.  Offered to head CT.  But they did not want that.  She is got MRI scheduled for June 24.  In addition there has been no further spells from what occurred on Saturday.  That may have been TIA could have been of a focal seizure.  It certainly was very brief. Final Clinical Impression(s) / ED Diagnoses Final diagnoses:  Tylenol overdose, accidental or unintentional, initial encounter    Rx / DC Orders ED Discharge Orders    None       Vanetta Mulders, MD 01/15/20 1906

## 2020-01-18 ENCOUNTER — Ambulatory Visit
Admission: RE | Admit: 2020-01-18 | Discharge: 2020-01-18 | Disposition: A | Discharge status: Home / Self Care | Payer: Medicare Other | Source: Ambulatory Visit | Attending: Neurology | Admitting: Neurology

## 2020-01-18 ENCOUNTER — Other Ambulatory Visit: Payer: Self-pay

## 2020-01-18 DIAGNOSIS — H532 Diplopia: Secondary | ICD-10-CM

## 2020-01-18 DIAGNOSIS — R2681 Unsteadiness on feet: Secondary | ICD-10-CM

## 2020-01-18 DIAGNOSIS — W19XXXD Unspecified fall, subsequent encounter: Secondary | ICD-10-CM

## 2020-09-12 DIAGNOSIS — R2689 Other abnormalities of gait and mobility: Secondary | ICD-10-CM | POA: Diagnosis not present

## 2020-09-12 DIAGNOSIS — M5136 Other intervertebral disc degeneration, lumbar region: Secondary | ICD-10-CM | POA: Diagnosis not present

## 2020-10-01 DIAGNOSIS — R040 Epistaxis: Secondary | ICD-10-CM | POA: Diagnosis not present

## 2021-01-03 DIAGNOSIS — N3000 Acute cystitis without hematuria: Secondary | ICD-10-CM | POA: Diagnosis not present

## 2021-01-06 DIAGNOSIS — Z03818 Encounter for observation for suspected exposure to other biological agents ruled out: Secondary | ICD-10-CM | POA: Diagnosis not present

## 2021-01-06 DIAGNOSIS — R531 Weakness: Secondary | ICD-10-CM | POA: Diagnosis not present

## 2021-01-06 DIAGNOSIS — R29898 Other symptoms and signs involving the musculoskeletal system: Secondary | ICD-10-CM | POA: Diagnosis not present

## 2021-01-08 DIAGNOSIS — M79602 Pain in left arm: Secondary | ICD-10-CM | POA: Diagnosis not present

## 2021-01-08 DIAGNOSIS — R29898 Other symptoms and signs involving the musculoskeletal system: Secondary | ICD-10-CM | POA: Diagnosis not present

## 2021-01-08 DIAGNOSIS — R479 Unspecified speech disturbances: Secondary | ICD-10-CM | POA: Diagnosis not present

## 2021-01-08 DIAGNOSIS — M1990 Unspecified osteoarthritis, unspecified site: Secondary | ICD-10-CM | POA: Diagnosis not present

## 2021-01-08 DIAGNOSIS — G894 Chronic pain syndrome: Secondary | ICD-10-CM | POA: Diagnosis not present

## 2021-01-13 DIAGNOSIS — Q6100 Congenital renal cyst, unspecified: Secondary | ICD-10-CM | POA: Diagnosis not present

## 2021-01-13 DIAGNOSIS — Z8673 Personal history of transient ischemic attack (TIA), and cerebral infarction without residual deficits: Secondary | ICD-10-CM | POA: Diagnosis not present

## 2021-01-13 DIAGNOSIS — Z9181 History of falling: Secondary | ICD-10-CM | POA: Diagnosis not present

## 2021-01-13 DIAGNOSIS — H9190 Unspecified hearing loss, unspecified ear: Secondary | ICD-10-CM | POA: Diagnosis not present

## 2021-01-13 DIAGNOSIS — I10 Essential (primary) hypertension: Secondary | ICD-10-CM | POA: Diagnosis not present

## 2021-01-13 DIAGNOSIS — Z87891 Personal history of nicotine dependence: Secondary | ICD-10-CM | POA: Diagnosis not present

## 2021-01-13 DIAGNOSIS — M5136 Other intervertebral disc degeneration, lumbar region: Secondary | ICD-10-CM | POA: Diagnosis not present

## 2021-01-13 DIAGNOSIS — M810 Age-related osteoporosis without current pathological fracture: Secondary | ICD-10-CM | POA: Diagnosis not present

## 2021-01-13 DIAGNOSIS — B0229 Other postherpetic nervous system involvement: Secondary | ICD-10-CM | POA: Diagnosis not present

## 2021-01-13 DIAGNOSIS — M1990 Unspecified osteoarthritis, unspecified site: Secondary | ICD-10-CM | POA: Diagnosis not present

## 2021-01-13 DIAGNOSIS — I700 Atherosclerosis of aorta: Secondary | ICD-10-CM | POA: Diagnosis not present

## 2021-01-13 DIAGNOSIS — D649 Anemia, unspecified: Secondary | ICD-10-CM | POA: Diagnosis not present

## 2021-01-13 DIAGNOSIS — M79602 Pain in left arm: Secondary | ICD-10-CM | POA: Diagnosis not present

## 2021-01-13 DIAGNOSIS — E782 Mixed hyperlipidemia: Secondary | ICD-10-CM | POA: Diagnosis not present

## 2021-01-13 DIAGNOSIS — K5790 Diverticulosis of intestine, part unspecified, without perforation or abscess without bleeding: Secondary | ICD-10-CM | POA: Diagnosis not present

## 2021-01-13 DIAGNOSIS — M532X7 Spinal instabilities, lumbosacral region: Secondary | ICD-10-CM | POA: Diagnosis not present

## 2021-01-13 DIAGNOSIS — R479 Unspecified speech disturbances: Secondary | ICD-10-CM | POA: Diagnosis not present

## 2021-01-13 DIAGNOSIS — G8929 Other chronic pain: Secondary | ICD-10-CM | POA: Diagnosis not present

## 2021-01-16 DIAGNOSIS — R296 Repeated falls: Secondary | ICD-10-CM | POA: Diagnosis not present

## 2021-01-17 DIAGNOSIS — H9190 Unspecified hearing loss, unspecified ear: Secondary | ICD-10-CM | POA: Diagnosis not present

## 2021-01-17 DIAGNOSIS — M810 Age-related osteoporosis without current pathological fracture: Secondary | ICD-10-CM | POA: Diagnosis not present

## 2021-01-17 DIAGNOSIS — M1990 Unspecified osteoarthritis, unspecified site: Secondary | ICD-10-CM | POA: Diagnosis not present

## 2021-01-17 DIAGNOSIS — Q6100 Congenital renal cyst, unspecified: Secondary | ICD-10-CM | POA: Diagnosis not present

## 2021-01-17 DIAGNOSIS — Z8673 Personal history of transient ischemic attack (TIA), and cerebral infarction without residual deficits: Secondary | ICD-10-CM | POA: Diagnosis not present

## 2021-01-17 DIAGNOSIS — M532X7 Spinal instabilities, lumbosacral region: Secondary | ICD-10-CM | POA: Diagnosis not present

## 2021-01-17 DIAGNOSIS — I700 Atherosclerosis of aorta: Secondary | ICD-10-CM | POA: Diagnosis not present

## 2021-01-17 DIAGNOSIS — Z9181 History of falling: Secondary | ICD-10-CM | POA: Diagnosis not present

## 2021-01-17 DIAGNOSIS — R479 Unspecified speech disturbances: Secondary | ICD-10-CM | POA: Diagnosis not present

## 2021-01-17 DIAGNOSIS — Z87891 Personal history of nicotine dependence: Secondary | ICD-10-CM | POA: Diagnosis not present

## 2021-01-17 DIAGNOSIS — I10 Essential (primary) hypertension: Secondary | ICD-10-CM | POA: Diagnosis not present

## 2021-01-17 DIAGNOSIS — M79602 Pain in left arm: Secondary | ICD-10-CM | POA: Diagnosis not present

## 2021-01-17 DIAGNOSIS — E782 Mixed hyperlipidemia: Secondary | ICD-10-CM | POA: Diagnosis not present

## 2021-01-17 DIAGNOSIS — K5790 Diverticulosis of intestine, part unspecified, without perforation or abscess without bleeding: Secondary | ICD-10-CM | POA: Diagnosis not present

## 2021-01-17 DIAGNOSIS — B0229 Other postherpetic nervous system involvement: Secondary | ICD-10-CM | POA: Diagnosis not present

## 2021-01-17 DIAGNOSIS — M5136 Other intervertebral disc degeneration, lumbar region: Secondary | ICD-10-CM | POA: Diagnosis not present

## 2021-01-17 DIAGNOSIS — G8929 Other chronic pain: Secondary | ICD-10-CM | POA: Diagnosis not present

## 2021-01-17 DIAGNOSIS — D649 Anemia, unspecified: Secondary | ICD-10-CM | POA: Diagnosis not present

## 2021-01-19 ENCOUNTER — Emergency Department (HOSPITAL_COMMUNITY): Payer: Medicare Other

## 2021-01-19 ENCOUNTER — Other Ambulatory Visit (HOSPITAL_COMMUNITY): Payer: Medicare Other

## 2021-01-19 ENCOUNTER — Inpatient Hospital Stay (HOSPITAL_COMMUNITY): Payer: Medicare Other

## 2021-01-19 ENCOUNTER — Other Ambulatory Visit: Payer: Self-pay

## 2021-01-19 ENCOUNTER — Inpatient Hospital Stay (HOSPITAL_COMMUNITY)
Admission: EM | Admit: 2021-01-19 | Discharge: 2021-01-24 | DRG: 062 | Disposition: E | Payer: Medicare Other | Attending: Neurology | Admitting: Neurology

## 2021-01-19 DIAGNOSIS — Z66 Do not resuscitate: Secondary | ICD-10-CM | POA: Diagnosis not present

## 2021-01-19 DIAGNOSIS — D62 Acute posthemorrhagic anemia: Secondary | ICD-10-CM | POA: Diagnosis not present

## 2021-01-19 DIAGNOSIS — Z87891 Personal history of nicotine dependence: Secondary | ICD-10-CM | POA: Diagnosis not present

## 2021-01-19 DIAGNOSIS — R9431 Abnormal electrocardiogram [ECG] [EKG]: Secondary | ICD-10-CM | POA: Diagnosis not present

## 2021-01-19 DIAGNOSIS — Z87440 Personal history of urinary (tract) infections: Secondary | ICD-10-CM | POA: Diagnosis not present

## 2021-01-19 DIAGNOSIS — Z20822 Contact with and (suspected) exposure to covid-19: Secondary | ICD-10-CM | POA: Diagnosis not present

## 2021-01-19 DIAGNOSIS — K5792 Diverticulitis of intestine, part unspecified, without perforation or abscess without bleeding: Secondary | ICD-10-CM | POA: Diagnosis not present

## 2021-01-19 DIAGNOSIS — R29818 Other symptoms and signs involving the nervous system: Secondary | ICD-10-CM | POA: Diagnosis not present

## 2021-01-19 DIAGNOSIS — I639 Cerebral infarction, unspecified: Secondary | ICD-10-CM

## 2021-01-19 DIAGNOSIS — Z8673 Personal history of transient ischemic attack (TIA), and cerebral infarction without residual deficits: Secondary | ICD-10-CM | POA: Diagnosis not present

## 2021-01-19 DIAGNOSIS — R6889 Other general symptoms and signs: Secondary | ICD-10-CM | POA: Diagnosis not present

## 2021-01-19 DIAGNOSIS — Z96652 Presence of left artificial knee joint: Secondary | ICD-10-CM | POA: Diagnosis present

## 2021-01-19 DIAGNOSIS — R270 Ataxia, unspecified: Secondary | ICD-10-CM | POA: Diagnosis not present

## 2021-01-19 DIAGNOSIS — Z801 Family history of malignant neoplasm of trachea, bronchus and lung: Secondary | ICD-10-CM | POA: Diagnosis not present

## 2021-01-19 DIAGNOSIS — R471 Dysarthria and anarthria: Secondary | ICD-10-CM | POA: Diagnosis present

## 2021-01-19 DIAGNOSIS — K625 Hemorrhage of anus and rectum: Secondary | ICD-10-CM | POA: Diagnosis not present

## 2021-01-19 DIAGNOSIS — R0902 Hypoxemia: Secondary | ICD-10-CM | POA: Diagnosis not present

## 2021-01-19 DIAGNOSIS — G9340 Encephalopathy, unspecified: Secondary | ICD-10-CM | POA: Diagnosis not present

## 2021-01-19 DIAGNOSIS — Z823 Family history of stroke: Secondary | ICD-10-CM | POA: Diagnosis not present

## 2021-01-19 DIAGNOSIS — Z9049 Acquired absence of other specified parts of digestive tract: Secondary | ICD-10-CM

## 2021-01-19 DIAGNOSIS — R578 Other shock: Secondary | ICD-10-CM | POA: Diagnosis not present

## 2021-01-19 DIAGNOSIS — Z90710 Acquired absence of both cervix and uterus: Secondary | ICD-10-CM

## 2021-01-19 DIAGNOSIS — R29810 Facial weakness: Secondary | ICD-10-CM | POA: Diagnosis not present

## 2021-01-19 DIAGNOSIS — E785 Hyperlipidemia, unspecified: Secondary | ICD-10-CM | POA: Diagnosis present

## 2021-01-19 DIAGNOSIS — N179 Acute kidney failure, unspecified: Secondary | ICD-10-CM | POA: Diagnosis not present

## 2021-01-19 DIAGNOSIS — I63511 Cerebral infarction due to unspecified occlusion or stenosis of right middle cerebral artery: Secondary | ICD-10-CM

## 2021-01-19 DIAGNOSIS — I6350 Cerebral infarction due to unspecified occlusion or stenosis of unspecified cerebral artery: Secondary | ICD-10-CM | POA: Diagnosis not present

## 2021-01-19 DIAGNOSIS — Z452 Encounter for adjustment and management of vascular access device: Secondary | ICD-10-CM

## 2021-01-19 DIAGNOSIS — G8194 Hemiplegia, unspecified affecting left nondominant side: Secondary | ICD-10-CM | POA: Diagnosis not present

## 2021-01-19 DIAGNOSIS — G319 Degenerative disease of nervous system, unspecified: Secondary | ICD-10-CM | POA: Diagnosis not present

## 2021-01-19 DIAGNOSIS — Z9282 Status post administration of tPA (rtPA) in a different facility within the last 24 hours prior to admission to current facility: Secondary | ICD-10-CM | POA: Diagnosis not present

## 2021-01-19 DIAGNOSIS — I10 Essential (primary) hypertension: Secondary | ICD-10-CM | POA: Diagnosis not present

## 2021-01-19 DIAGNOSIS — K5730 Diverticulosis of large intestine without perforation or abscess without bleeding: Secondary | ICD-10-CM | POA: Diagnosis not present

## 2021-01-19 DIAGNOSIS — Z8249 Family history of ischemic heart disease and other diseases of the circulatory system: Secondary | ICD-10-CM | POA: Diagnosis not present

## 2021-01-19 DIAGNOSIS — I672 Cerebral atherosclerosis: Secondary | ICD-10-CM | POA: Diagnosis not present

## 2021-01-19 DIAGNOSIS — R4781 Slurred speech: Secondary | ICD-10-CM | POA: Diagnosis not present

## 2021-01-19 DIAGNOSIS — K922 Gastrointestinal hemorrhage, unspecified: Secondary | ICD-10-CM | POA: Diagnosis not present

## 2021-01-19 DIAGNOSIS — D509 Iron deficiency anemia, unspecified: Secondary | ICD-10-CM | POA: Diagnosis not present

## 2021-01-19 DIAGNOSIS — Z743 Need for continuous supervision: Secondary | ICD-10-CM | POA: Diagnosis not present

## 2021-01-19 DIAGNOSIS — K921 Melena: Secondary | ICD-10-CM | POA: Diagnosis not present

## 2021-01-19 DIAGNOSIS — I739 Peripheral vascular disease, unspecified: Secondary | ICD-10-CM | POA: Diagnosis not present

## 2021-01-19 DIAGNOSIS — K449 Diaphragmatic hernia without obstruction or gangrene: Secondary | ICD-10-CM | POA: Diagnosis not present

## 2021-01-19 LAB — COMPREHENSIVE METABOLIC PANEL
ALT: 12 U/L (ref 0–44)
AST: 18 U/L (ref 15–41)
Albumin: 3.4 g/dL — ABNORMAL LOW (ref 3.5–5.0)
Alkaline Phosphatase: 57 U/L (ref 38–126)
Anion gap: 9 (ref 5–15)
BUN: 21 mg/dL (ref 8–23)
CO2: 22 mmol/L (ref 22–32)
Calcium: 9.7 mg/dL (ref 8.9–10.3)
Chloride: 109 mmol/L (ref 98–111)
Creatinine, Ser: 0.9 mg/dL (ref 0.44–1.00)
GFR, Estimated: 60 mL/min — ABNORMAL LOW (ref >60)
Glucose, Bld: 97 mg/dL (ref 70–99)
Potassium: 4 mmol/L (ref 3.5–5.1)
Sodium: 140 mmol/L (ref 135–145)
Total Bilirubin: 0.4 mg/dL (ref 0.3–1.2)
Total Protein: 7 g/dL (ref 6.5–8.1)

## 2021-01-19 LAB — I-STAT CHEM 8, ED
BUN: 21 mg/dL (ref 8–23)
Calcium, Ion: 1.26 mmol/L (ref 1.15–1.40)
Chloride: 108 mmol/L (ref 98–111)
Creatinine, Ser: 0.8 mg/dL (ref 0.44–1.00)
Glucose, Bld: 97 mg/dL (ref 70–99)
HCT: 40 % (ref 36.0–46.0)
Hemoglobin: 13.6 g/dL (ref 12.0–15.0)
Potassium: 3.9 mmol/L (ref 3.5–5.1)
Sodium: 142 mmol/L (ref 135–145)
TCO2: 24 mmol/L (ref 22–32)

## 2021-01-19 LAB — ECHOCARDIOGRAM COMPLETE
AR max vel: 2.11 cm2
AV Area VTI: 2.67 cm2
AV Area mean vel: 2.51 cm2
AV Mean grad: 4 mmHg
AV Peak grad: 9.1 mmHg
Ao pk vel: 1.51 m/s
Area-P 1/2: 2.42 cm2
P 1/2 time: 538 msec
S' Lateral: 2.1 cm
Weight: 2328.06 oz

## 2021-01-19 LAB — CBC
HCT: 41.3 % (ref 36.0–46.0)
Hemoglobin: 13.3 g/dL (ref 12.0–15.0)
MCH: 29 pg (ref 26.0–34.0)
MCHC: 32.2 g/dL (ref 30.0–36.0)
MCV: 90.2 fL (ref 80.0–100.0)
Platelets: 384 10*3/uL (ref 150–400)
RBC: 4.58 MIL/uL (ref 3.87–5.11)
RDW: 14.6 % (ref 11.5–15.5)
WBC: 7.8 10*3/uL (ref 4.0–10.5)
nRBC: 0 % (ref 0.0–0.2)

## 2021-01-19 LAB — DIFFERENTIAL
Abs Immature Granulocytes: 0.04 10*3/uL (ref 0.00–0.07)
Basophils Absolute: 0 10*3/uL (ref 0.0–0.1)
Basophils Relative: 0 %
Eosinophils Absolute: 0 10*3/uL (ref 0.0–0.5)
Eosinophils Relative: 1 %
Immature Granulocytes: 1 %
Lymphocytes Relative: 18 %
Lymphs Abs: 1.4 10*3/uL (ref 0.7–4.0)
Monocytes Absolute: 0.6 10*3/uL (ref 0.1–1.0)
Monocytes Relative: 8 %
Neutro Abs: 5.8 10*3/uL (ref 1.7–7.7)
Neutrophils Relative %: 72 %

## 2021-01-19 LAB — PREPARE RBC (CROSSMATCH)

## 2021-01-19 LAB — GLUCOSE, CAPILLARY
Glucose-Capillary: 107 mg/dL — ABNORMAL HIGH (ref 70–99)
Glucose-Capillary: 115 mg/dL — ABNORMAL HIGH (ref 70–99)

## 2021-01-19 LAB — HEMOGLOBIN AND HEMATOCRIT, BLOOD
HCT: 33.6 % — ABNORMAL LOW (ref 36.0–46.0)
HCT: 36.6 % (ref 36.0–46.0)
Hemoglobin: 11 g/dL — ABNORMAL LOW (ref 12.0–15.0)
Hemoglobin: 11.5 g/dL — ABNORMAL LOW (ref 12.0–15.0)

## 2021-01-19 LAB — PROTIME-INR
INR: 1 (ref 0.8–1.2)
Prothrombin Time: 13.4 seconds (ref 11.4–15.2)

## 2021-01-19 LAB — SARS CORONAVIRUS 2 (TAT 6-24 HRS): SARS Coronavirus 2: NEGATIVE

## 2021-01-19 LAB — FIBRINOGEN: Fibrinogen: 200 mg/dL — ABNORMAL LOW (ref 210–475)

## 2021-01-19 LAB — APTT: aPTT: 34 seconds (ref 24–36)

## 2021-01-19 LAB — CBG MONITORING, ED: Glucose-Capillary: 85 mg/dL (ref 70–99)

## 2021-01-19 LAB — MRSA NEXT GEN BY PCR, NASAL: MRSA by PCR Next Gen: NOT DETECTED

## 2021-01-19 MED ORDER — CHLORHEXIDINE GLUCONATE CLOTH 2 % EX PADS
6.0000 | MEDICATED_PAD | Freq: Every day | CUTANEOUS | Status: DC
  Administered 2021-01-19: 6 via TOPICAL

## 2021-01-19 MED ORDER — ADULT MULTIVITAMIN W/MINERALS CH: 1.0000 | ORAL_TABLET | Freq: Every day | ORAL | Status: DC

## 2021-01-19 MED ORDER — ORAL CARE MOUTH RINSE
15.0000 mL | Freq: Two times a day (BID) | OROMUCOSAL | Status: DC
  Administered 2021-01-20 – 2021-01-21 (×2): 15 mL via OROMUCOSAL

## 2021-01-19 MED ORDER — ACETAMINOPHEN 160 MG/5ML PO SOLN: 650.0000 mg | ORAL | Status: DC | PRN

## 2021-01-19 MED ORDER — ACETAMINOPHEN 650 MG RE SUPP: 650.0000 mg | RECTAL | Status: DC | PRN

## 2021-01-19 MED ORDER — CALCIUM CARBONATE-VITAMIN D 500-200 MG-UNIT PO TABS: 1.0000 | ORAL_TABLET | Freq: Every day | ORAL | Status: DC

## 2021-01-19 MED ORDER — ACETAMINOPHEN 325 MG PO TABS: 650.0000 mg | ORAL_TABLET | ORAL | Status: DC | PRN

## 2021-01-19 MED ORDER — CLEVIDIPINE BUTYRATE 0.5 MG/ML IV EMUL
INTRAVENOUS | Status: AC
  Administered 2021-01-19: 4 mg/h
  Filled 2021-01-19: qty 50

## 2021-01-19 MED ORDER — CLEVIDIPINE BUTYRATE 0.5 MG/ML IV EMUL
0.0000 mg/h | INTRAVENOUS | Status: DC
  Administered 2021-01-19: 2 mg/h via INTRAVENOUS
  Administered 2021-01-20: 4 mg/h via INTRAVENOUS
  Filled 2021-01-19: qty 100

## 2021-01-19 MED ORDER — STROKE: EARLY STAGES OF RECOVERY BOOK: Freq: Once | Status: DC

## 2021-01-19 MED ORDER — ALTEPLASE (STROKE) FULL DOSE INFUSION
0.9000 mg/kg | Freq: Once | INTRAVENOUS | Status: AC
  Administered 2021-01-19: 59.4 mg via INTRAVENOUS
  Filled 2021-01-19: qty 100

## 2021-01-19 MED ORDER — PANTOPRAZOLE INFUSION (NEW) - SIMPLE MED
8.0000 mg/h | INTRAVENOUS | Status: DC
  Administered 2021-01-19 – 2021-01-20 (×2): 8 mg/h via INTRAVENOUS
  Filled 2021-01-19 (×2): qty 80

## 2021-01-19 MED ORDER — PANTOPRAZOLE SODIUM 40 MG IV SOLR: 40.0000 mg | Freq: Two times a day (BID) | INTRAVENOUS | Status: DC

## 2021-01-19 MED ORDER — SODIUM CHLORIDE 0.9 % IV SOLN
50.0000 mL | Freq: Once | INTRAVENOUS | Status: AC
  Administered 2021-01-19: 50 mL via INTRAVENOUS

## 2021-01-19 MED ORDER — PANTOPRAZOLE 80MG IVPB - SIMPLE MED
80.0000 mg | Freq: Once | INTRAVENOUS | Status: AC
  Administered 2021-01-19: 80 mg via INTRAVENOUS
  Filled 2021-01-19: qty 80

## 2021-01-19 MED ORDER — SENNOSIDES-DOCUSATE SODIUM 8.6-50 MG PO TABS: 1.0000 | ORAL_TABLET | Freq: Every evening | ORAL | Status: DC | PRN

## 2021-01-19 MED ORDER — PANTOPRAZOLE SODIUM 40 MG IV SOLR: 40.0000 mg | Freq: Every day | INTRAVENOUS | Status: DC

## 2021-01-19 MED ORDER — SODIUM CHLORIDE 0.9% IV SOLUTION: Freq: Once | INTRAVENOUS | Status: DC

## 2021-01-19 MED ORDER — SODIUM CHLORIDE 0.9% FLUSH
3.0000 mL | Freq: Once | INTRAVENOUS | Status: AC
Start: 2021-01-19 — End: 2021-01-19
  Administered 2021-01-19: 3 mL via INTRAVENOUS

## 2021-01-19 MED ORDER — SODIUM CHLORIDE 0.9 % IV SOLN: INTRAVENOUS | Status: DC

## 2021-01-19 MED ORDER — LABETALOL HCL 5 MG/ML IV SOLN
INTRAVENOUS | Status: AC
  Administered 2021-01-19: 10 mg
  Filled 2021-01-19: qty 4

## 2021-01-19 MED ORDER — CHLORHEXIDINE GLUCONATE 0.12 % MT SOLN
15.0000 mL | Freq: Two times a day (BID) | OROMUCOSAL | Status: DC
  Administered 2021-01-19 – 2021-01-20 (×3): 15 mL via OROMUCOSAL

## 2021-01-19 NOTE — Progress Notes (Addendum)
SLP Cancellation Note  Patient Details Name: Kimberly Rasmussen MRN: [id number] DOB: 28-Oct-1925   Cancelled treatment:        RN stated pt appropriate for swallow assessment after failing [facility]. Arrived to room and pt needing emergent PICC line. Dr Katrinka Blazing present. Follow up tomorrow   [name] 01/18/2021, 5:02 PM

## 2021-01-19 NOTE — Progress Notes (Signed)
Brief Neuro update:  Notified by RN about bright red blood per rectum. Soaked the pad on the bed, and the spot is about the size of a foot ball.  Evaluated patient at bedside and Neuro exam is similar. Vitals are stable with blood pressure in 140s systolic. Patient does not appear in any distress with no shortness of breath.   Discussed with daughter and got her consent for blood transfusion if needed.  I do not think we need to reverse her tPA at this time. Specially since she has the DWI + stroke on MRI.  Recs: - Q4 hours H and H - Fibrinogen levels - Type and cross. - Will transfuse if Hb is below 8 - Will reverse tPA and transfuse if she is hemodynamically unstable.   Erick Blinks [facility] Pager Number [phone]

## 2021-01-19 NOTE — ED Notes (Signed)
Perfromed at 1043; forgot to change time when charting

## 2021-01-19 NOTE — ED Triage Notes (Signed)
Pt from home BIB [facility] for CODE STROKE. LKWT last night at bedtime. Pt was ambulating with walker last night. At 0630, pt called family for help and couldn't move legs. 0730 pt had left side facial droop, left sided weakness, and slurred speech. Family denies any recent falls/injuries or blood thinner use. BGL 85 on arrival to ED.

## 2021-01-19 NOTE — Progress Notes (Signed)
CXR reviwed, may be curled up in an accessory vein but fine to use, all ports flush and pull.  Myrla Halsted MD [facility]

## 2021-01-19 NOTE — Progress Notes (Signed)
Pharmacist Code Stroke Response  Notified to mix tPA at 0915 by Dr. Derry Lory Delivered tPA to RN at [phone]  tPA dose = 5.9mg  bolus over 1 minute followed by 53.5mg  for a total dose of 59.4mg  over 1 hour  Issues/delays encountered (if applicable): Travel from MRI, then had to get BP down with Cleviprex  Kimberly Rasmussen 01-27-2021 9:34 AM

## 2021-01-19 NOTE — ED Provider Notes (Signed)
[facility]EM[facility]ENCY DEPT [facility] Emergency Department Provider Note MRN:  [id number]  Arrival date & time: 2021-02-04     Chief Complaint   Code stroke History of Present Illness   Kimberly Rasmussen is a 87 y.o. year-old female with a history of hypertension presenting to the ED with chief complaint of code stroke.  Slurred speech noticed by family on 7:30 AM.  EMS noted left-sided facial droop and left arm weakness.  Patient has chronic leg weakness.  Review of Systems  Positive for slurred speech, facial droop.  Patient's Health History    Past Medical History:  Diagnosis Date   AKI (acute kidney injury) (HCC) 09/19/2018   Arthritis    Diverticulitis    Diverticulosis    Dyslipidemia    Dysrhythmia    irregular heart beat   HTN (hypertension)    Jaundice, obstructive, intrahepatic    s/p ERCP, lithotripsy, and stone removal 01/2011, 02/2011   PONV (postoperative nausea and vomiting)    N&V when awakening from anesthesia   Shortness of breath    saw cardiologist for this   UTI (urinary tract infection)    history of UTI    Past Surgical History:  Procedure Laterality Date   CHOLECYSTECTOMY     removal of gallstones x4 (?ERCP)   ERCP     with lithotripsy and gall stone removal 2012   ESOPHAGOGASTRODUODENOSCOPY N/A 04/11/2015   Procedure: ESOPHAGOGASTRODUODENOSCOPY (EGD);  Surgeon: Jeani Hawking, MD;  Location: [facility] ENDOSCOPY;  Service: Endoscopy;  Laterality: N/A;   FOOT SURGERY     x2   KNEE SURGERY     LEFT KNEE   TOTAL ABDOMINAL HYSTERECTOMY     TOTAL KNEE ARTHROPLASTY     RIGHT KNEE   TOTAL KNEE ARTHROPLASTY  07/13/2011   Procedure: TOTAL KNEE ARTHROPLASTY;  Surgeon: Nestor Lewandowsky;  Location: [facility] OR;  Service: Orthopedics;  Laterality: Left;  Left Total Knee Arthroplasty with Hardware Removal     Family History  Problem Relation Age of Onset   Pneumonia Mother    Lung cancer Sister    Hypertension Maternal Grandmother    Stroke Maternal Grandmother      Social History   Socioeconomic History   Marital status: Widowed    Spouse name: Not on file   Number of children: Not on file   Years of education: Not on file   Highest education level: Not on file  Occupational History   Not on file  Tobacco Use   Smoking status: Former    Packs/day: 0.50    Years: 40.00    Pack years: 20.00    Types: Cigarettes    Quit date: 07/05/1996    Years since quitting: 24.5   Smokeless tobacco: Never  Substance and Sexual Activity   Alcohol use: Yes    Alcohol/week: 1.0 standard drink    Types: 1 Glasses of wine per week    Comment: occasional   Drug use: No   Sexual activity: Never  Other Topics Concern   Not on file  Social History Narrative   Right handed   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 living faculty      Social Determinants of Health   Financial Resource Strain: Not on file  Food Insecurity: Not on file  Transportation Needs: Not on file  Physical Activity: Not on file  Stress: Not on file  Social Connections: Not on file  Intimate Partner Violence: Not on file     Physical Exam   Vitals:  2021-01-26 0936 01/26/2021 0941  BP: (!) 146/77 140/71  Pulse: 78   Resp: 19   SpO2: 95%     CONSTITUTIONAL: Chronically ill-appearing, NAD NEURO: Slurred speech, left-sided weakness EYES:  eyes equal and reactive ENT/NECK:  no LAD, no JVD CARDIO: Regular rate, well-perfused, normal S1 and S2 PULM:  CTAB no wheezing or rhonchi GI/GU:  normal bowel sounds, non-distended, non-tender MSK/SPINE:  No gross deformities, no edema SKIN:  no rash, atraumatic PSYCH:  Appropriate speech and behavior  *Additional and/or pertinent findings included in MDM below  Diagnostic and Interventional Summary    EKG Interpretation  Date/Time:  Sunday 01/26/21 09:26:58 EDT Ventricular Rate:  86 PR Interval:  157 QRS Duration: 87 QT Interval:  381 QTC Calculation: 456 R Axis:   46 Text Interpretation: Sinus rhythm Borderline low voltage, extremity  leads Minimal ST depression, inferior leads Confirmed by Kennis Carina [phone]) on 26-Jan-2021 9:49:07 AM        Labs Reviewed  COMPREHENSIVE METABOLIC PANEL - Abnormal; Notable for the following components:      Result Value   Albumin 3.4 (*)    GFR, Estimated 60 (*)    All other components within normal limits  PROTIME-INR  APTT  CBC  DIFFERENTIAL  I-STAT CHEM 8, ED  CBG MONITORING, ED    MR BRAIN WO CONTRAST  Final Result    CT HEAD CODE STROKE WO CONTRAST  Final Result    MR ANGIO HEAD WO CONTRAST    (Results Pending)  VAS US CAROTID    (Results Pending)    Medications  alteplase (ACTIVASE) 1 mg/mL infusion 59.4 mg (59.4 mg Intravenous New Bag/Given January 26, 2021 0928)    Followed by  0.9 %  sodium chloride infusion (has no administration in time range)   stroke: mapping our early stages of recovery book (has no administration in time range)  0.9 %  sodium chloride infusion (has no administration in time range)  acetaminophen (TYLENOL) tablet 650 mg (has no administration in time range)    Or  acetaminophen (TYLENOL) 160 MG/5ML solution 650 mg (has no administration in time range)    Or  acetaminophen (TYLENOL) suppository 650 mg (has no administration in time range)  senna-docusate (Senokot-S) tablet 1 tablet (has no administration in time range)  pantoprazole (PROTONIX) injection 40 mg (has no administration in time range)  clevidipine (CLEVIPREX) infusion 0.5 mg/mL (has no administration in time range)  sodium chloride flush (NS) 0.9 % injection 3 mL (3 mLs Intravenous Given 26-Jan-2021 0845)  labetalol (NORMODYNE) 5 MG/ML injection (10 mg  Given January 26, 2021 0935)  clevidipine (CLEVIPREX) 0.5 MG/ML infusion (4 mg/hr  New Bag/Given 01-26-2021 0920)     Procedures  /  Critical Care .Critical Care  Date/Time: 01/26/2021 8:50 AM Performed by: Sabas Sous, MD Authorized by: Sabas Sous, MD   Critical care provider statement:    Critical care time (minutes):  35   Critical  care was necessary to treat or prevent imminent or life-threatening deterioration of the following conditions: Concern for acute ischemic stroke.   Critical care was time spent personally by me on the following activities:  Discussions with consultants, evaluation of patient's response to treatment, examination of patient, ordering and performing treatments and interventions, ordering and review of laboratory studies, ordering and review of radiographic studies, pulse oximetry, re-evaluation of patient's condition, obtaining history from patient or surrogate and review of old charts  ED Course and Medical Decision Making  I have reviewed the  triage vital signs, the nursing notes, and pertinent available records from the EMR.  Listed above are laboratory and imaging tests that I personally ordered, reviewed, and interpreted and then considered in my medical decision making (see below for details).  Concern for acute ischemic stroke, code stroke initiated, awaiting CT imaging.     MRI consistent with stroke, being provided with tPA by neurology.  To be admitted to neurology service.  Elmer Sow. Pilar Plate, MD [facility] Emergency Medicine [facility] [email or web address]  Final Clinical Impressions(s) / ED Diagnoses     ICD-10-CM   1. Acute ischemic stroke [facility])  I63.9       ED Discharge Orders     None        Discharge Instructions Discussed with and Provided to Patient:   Discharge Instructions   None       Sabas Sous, MD 02-04-21 [phone]

## 2021-01-19 NOTE — Progress Notes (Signed)
Lab and 2 RNs attempting to stick pt for H&H. Forrest Moron NP on unit to assess pt. Decision made to have CCM place IJ. Orders place to give emergent release of 1 unit of blood. After IJ placed RN assessed pt. Pt eyes open. Sonorus respirations. Only w/d to pain in left arm. NP paged. Order for stat head CT and another unit of blood ordered. MD and NP met pt in CT. By this time pt began to respond and able to follow commands for MD.

## 2021-01-19 NOTE — Plan of Care (Signed)
RN called neuro team to say that patient was c/o HA and was perhaps a little weaker on exam. Patient is post tPA and has a GIB. Will get stat CTH to r/o bleed. Order in.   Jimmye Norman, NP Neurology

## 2021-01-19 NOTE — Plan of Care (Signed)
Stopped in to see how patient was doing and found that H/H had not been drawn yet with failed attempts x 3. Very difficult stick. She has moderate bleeding from her rectum which is bright red in color. She likely has a diverticular bleed. Her BP is down to 112, so will go ahead and hang one unit PRBCs emergently while H/H is running. Because of need for serial labs, Dr. Katrinka Blazing from [facility] is putting in a central line at our request.   Started patient on a Protonix drip and increased IVF to 100cc/hr.   Dr. Ezzie Dural notified of above.   Jimmye Norman, NP Neurology

## 2021-01-19 NOTE — Progress Notes (Signed)
Pt still prfusely bleeding from rectum. Pt complaining of H/A. MD notified. Orders for stat head CT and fibrinogen lab ordered.

## 2021-01-19 NOTE — Progress Notes (Signed)
Pt arrived from ED. While turning pt RN noted about 30cc of bright red blood pouring from pt rectum. MD paged. A few minutes later pt needed to have BM. When rolling pt a basketball size circle of additional bright red blood on bed pad. Right arm medium size hematoma also noted on pt right ac that appears to be from a IV stick. Pressure applied. MD notified. Orders for serial H&H and type and screen ordered. Assessment and vitals unchanged. Will continue to monitor.

## 2021-01-19 NOTE — Progress Notes (Signed)
  Echocardiogram 2D Echocardiogram has been performed.  Gerda Diss 01/18/2021, 2:37 PM

## 2021-01-19 NOTE — Procedures (Signed)
Central Venous Catheter Insertion Procedure Note  Kimberly Rasmussen  [id number]  1926-07-09  Date:01/17/2021  Time:5:17 PM   Provider Performing:Calab Sachse Salena Saner Katrinka Blazing   Procedure: Insertion of Non-tunneled Central Venous [phone]) with US guidance ([id number])   Indication(s) Difficult access  Consent Risks of the procedure as well as the alternatives and risks of each were explained to the patient and/or caregiver.  Consent for the procedure was obtained and is signed in the bedside chart  Anesthesia Topical only with 1% lidocaine   Timeout Verified patient identification, verified procedure, site/side was marked, verified correct patient position, special equipment/implants available, medications/allergies/relevant history reviewed, required imaging and test results available.  Sterile Technique Maximal sterile technique including full sterile barrier drape, hand hygiene, sterile gown, sterile gloves, mask, hair covering, sterile ultrasound probe cover (if used).  Procedure Description Area of catheter insertion was cleaned with chlorhexidine and draped in sterile fashion.  With real-time ultrasound guidance a central venous catheter was placed into the left internal jugular vein. Nonpulsatile blood flow and easy flushing noted in all ports.  The catheter was sutured in place and sterile dressing applied.  Complications/Tolerance None; patient tolerated the procedure well. Chest X-ray is ordered to verify placement for internal jugular or subclavian cannulation.   Chest x-ray is not ordered for femoral cannulation.  EBL Minimal  Specimen(s) None

## 2021-01-19 NOTE — Progress Notes (Signed)
PT Cancellation Note  Patient Details Name: Kimberly Rasmussen MRN: [id number] DOB: September 16, 1925   Cancelled Treatment:    Reason Eval/Treat Not Completed: Active bedrest order. tPA started at 0928 on 2021-01-23. Will plan to follow-up another day as appropriate.   Raymond Gurney, PT, DPT Acute Rehabilitation Services  Pager: [phone] Office: [phone]    Jewel Baize January 23, 2021, 1:30 PM

## 2021-01-19 NOTE — ED Notes (Signed)
Attempted to call report. Charge RN requested to call back in 20 minutes due to multiple admissions on unit.

## 2021-01-19 NOTE — H&P (Signed)
NEUROLOGY CONSULTATION NOTE   Date of service: January 19, 2021 Patient Name: Kimberly Rasmussen MRN:  [id number] DOB:  1926/02/15 Reason for consult: "Stroke code, left sided weakness" Requesting Provider: Erick Blinks, MD _ _ _   _ __   _ __ _ _  __ __   _ __   __ _  History of Present Illness  Kimberly Rasmussen is a 87 y.o. female with PMH significant for HTN, HLD, OA, who walked to her bed last night using her walker at around 2030, watched TV for an hour in bed and woke up in the AM around 0630 and could not move her legs. She called over her son in law and they tried to get her to stand up but could not. About an hour later, they noted that her speech was slurring and her left arm was limp. They called EMS and she was brought in as a stroke code. When the EMS got there, her speech improved a little and she started to move her L arm and then it got worse enroute.  Daughter reports that she was independent before June 4th, started having some intermittent symptoms since. She had leg weakness that lasted about 10 mins and her R leg would not move. They called EMS but it resolved within 10 mins and before EMS could get there. Then had an episode of speech issues where she could not speak. Called EMS again but this resolved before EMS got there so they did not take her. Since she has been having recurring episode, they thought that her episode today was probably same and would go away shortly but this did not go away.  Does not smoke, PCP has been cutting down on hydrocodone. Does not take aspirin at home.   mRS: 3 LKW 2030 on 01/18/21 tPA: yes offered under wake up protocol. MRI with DWI/FLAIR mismatch. Decision to offer tPA was made at0915. Her SBP was elevated to 213 and she was a hard stick. Delay in establishing IV line 2/2 hard stick and cleviprex + labetalol to bring the SBP down before tPA could be initiated. tPA started at 0928. Thrombectomy: Low concern for LVO NIHSS components  Score: Comment  1a Level of Conscious 0[x]  1[]  2[]  3[]      1b LOC Questions 0[]  1[x]  2[]       1c LOC Commands 0[x]  1[]  2[]       2 Best Gaze 0[x]  1[]  2[]       3 Visual 0[x]  1[]  2[]  3[]      4 Facial Palsy 0[]  1[]  2[x]  3[]    Left facial droop  5a Motor Arm - left 0[]  1[]  2[]  3[]  4[x]  UN[]    5b Motor Arm - Right 0[x]  1[]  2[]  3[]  4[]  UN[]    6a Motor Leg - Left 0[]  1[]  2[]  3[x]  4[]  UN[]    6b Motor Leg - Right 0[x]  1[]  2[]  3[]  4[]  UN[]    7 Limb Ataxia 0[x]  1[]  2[]  3[]  UN[]     8 Sensory 0[x]  1[]  2[]  UN[]      9 Best Language 0[x]  1[]  2[]  3[]      10 Dysarthria 0[]  1[]  2[x]  UN[]      11 Extinct. and Inattention 0[x]  1[]  2[]       TOTAL: 12      ROS   Constitutional Denies weight loss, fever and chills.   HEENT Denies changes in vision. Hard of hearing.   Respiratory Denies SOB and cough.   CV Denies palpitations and CP   GI Denies abdominal pain, nausea,  vomiting and diarrhea.   GU Denies dysuria and urinary frequency.   MSK Denies myalgia and joint pain.   Skin Denies rash and pruritus.   Neurological Denies headache and syncope.   Psychiatric Denies recent changes in mood. Denies anxiety and depression.    Past History   Past Medical History:  Diagnosis Date  . AKI (acute kidney injury) (HCC) 09/19/2018  . Arthritis   . Diverticulitis   . Diverticulosis   . Dyslipidemia   . Dysrhythmia    irregular heart beat  . HTN (hypertension)   . Jaundice, obstructive, intrahepatic    s/p ERCP, lithotripsy, and stone removal 01/2011, 02/2011  . PONV (postoperative nausea and vomiting)    N&V when awakening from anesthesia  . Shortness of breath    saw cardiologist for this  . UTI (urinary tract infection)    history of UTI   Past Surgical History:  Procedure Laterality Date  . CHOLECYSTECTOMY     removal of gallstones x4 (?ERCP)  . ERCP     with lithotripsy and gall stone removal 2012  . ESOPHAGOGASTRODUODENOSCOPY N/A 04/11/2015   Procedure: ESOPHAGOGASTRODUODENOSCOPY (EGD);   Surgeon: Jeani HawkingPatrick Hung, MD;  [facility];  Service: Endoscopy;  Laterality: N/A;  . FOOT SURGERY     x2  . KNEE SURGERY     LEFT KNEE  . TOTAL ABDOMINAL HYSTERECTOMY    . TOTAL KNEE ARTHROPLASTY     RIGHT KNEE  . TOTAL KNEE ARTHROPLASTY  07/13/2011   Procedure: TOTAL KNEE ARTHROPLASTY;  Surgeon: Nestor LewandowskyFrank J Rowan;  Location: [facility] OR;  Service: Orthopedics;  Laterality: Left;  Left Total Knee Arthroplasty with Hardware Removal    Family History  Problem Relation Age of Onset  . Pneumonia Mother   . Lung cancer Sister   . Hypertension Maternal Grandmother   . Stroke Maternal Grandmother    Social History   Socioeconomic History  . Marital status: Widowed    Spouse name: Not on file  . Number of children: Not on file  . Years of education: Not on file  . Highest education level: Not on file  Occupational History  . Not on file  Tobacco Use  . Smoking status: Former    Packs/day: 0.50    Years: 40.00    Pack years: 20.00    Types: Cigarettes    Quit date: 07/05/1996    Years since quitting: 24.5  . Smokeless tobacco: Never  Substance and Sexual Activity  . Alcohol use: Yes    Alcohol/week: 1.0 standard drink    Types: 1 Glasses of wine per week    Comment: occasional  . Drug use: No  . Sexual activity: Never  Other Topics Concern  . Not on file  Social History Narrative   Right handed   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 living faculty      Social Determinants of Health   Financial Resource Strain: Not on file  Food Insecurity: Not on file  Transportation Needs: Not on file  Physical Activity: Not on file  Stress: Not on file  Social Connections: Not on file   Allergies  Allergen Reactions  . Amitriptyline Other (See Comments)    Hallucinations   . Gabapentin Other (See Comments)    Hallucinations   . Meloxicam Other (See Comments)    Hallucinations   . Tramadol Other (See Comments)    Hallucinations     Medications  (Not in a hospital admission)     Vitals   Vitals:  12/31/2020 0924 01/21/2021 0932 01/12/2021 0936 01/03/2021 0941  BP: (!) 162/81 (!) 192/94 (!) 146/77 140/71  Pulse: 79 82 78   Resp: 15 13 19    SpO2: 100% 98% 95%   Weight: 66 kg        Body mass index is 28.42 kg/m.  Physical Exam   General: Laying comfortably in bed; in no acute distress.  HENT: Normal oropharynx and mucosa. Normal external appearance of ears and nose.  Neck: Supple, no pain or tenderness  CV: No JVD. No peripheral edema.  Pulmonary: Symmetric Chest rise. Normal respiratory effort.  Abdomen: Soft to touch, non-tender.  Ext: No cyanosis, edema, or deformity  Skin: No rash. Normal palpation of skin.   Musculoskeletal: Normal digits and nails by inspection. No clubbing.  Neurologic Examination  Mental status/Cognition: Alert, oriented to self, place, but not month and year, good attention. Speech/language: Moderately dysarthric speech, Fluent, comprehension intact, object naming intact, repetition intact. Cranial nerves:   CN II Pupils equal and reactive to light, no VF deficits    CN III,IV,VI EOM intact, no gaze preference or deviation, no nystagmus   CN V normal sensation in V1, V2, and V3 segments bilaterally   CN VII L facial droop   CN VIII Hard of hearing   CN IX & X normal palatal elevation, no uvular deviation    CN XI    CN XII midline tongue protrusion    Motor:  Muscle bulk: poor, tone flaccid in LUE Mvmt Root Nerve  Muscle Right Left Comments  SA C5/6 Ax Deltoid 4+ 0   EF C5/6 Mc Biceps 5 0   EE C6/7/8 Rad Triceps 5 0   WF C6/7 Med FCR     WE C7/8 PIN ECU     F Ab C8/T1 U ADM/FDI 5 0   HF L1/2/3 Fem Illopsoas 5 1   KE L2/3/4 Fem Quad 5 0   DF L4/5 D Peron Tib Ant 4 0   PF S1/2 Tibial Grc/Sol 4 0    Reflexes:  Right Left Comments  Pectoralis      Biceps (C5/6) 2 2   Brachioradialis (C5/6) 2 2    Triceps (C6/7) 2 2    Patellar (L3/4) 2 2    Achilles (S1)      Hoffman      Plantar     Jaw jerk    Sensation:   Light touch Intact throughout   Pin prick    Temperature    Vibration   Proprioception    Coordination/Complex Motor:  - Finger to Nose intact in RUE - Heel to shin unable to do due to LLE weakness. - Rapid alternating movement are slowed - Gait: not safe to assess./  Labs   CBC:  Recent Labs  Lab 01/14/2021 0839 01/07/2021 0848  WBC 7.8  --   NEUTROABS 5.8  --   HGB 13.3 13.6  HCT 41.3 40.0  MCV 90.2  --   PLT 384  --     Basic Metabolic Panel:  Lab Results  Component Value Date   NA 142 01/12/2021   K 3.9 01/13/2021   CO2 22 12/31/2020   GLUCOSE 97 12/28/2020   BUN 21 01/12/2021   CREATININE 0.80 01/16/2021   CALCIUM 9.7 01/02/2021   GFRNONAA 60 (L) 01/03/2021   GFRAA >60 01/15/2020   Lipid Panel: No results found for: LDLCALC HgbA1c:  Lab Results  Component Value Date   HGBA1C 5.4 07/31/2011   Urine Drug  Screen: No results found for: LABOPIA, COCAINSCRNUR, LABBENZ, AMPHETMU, THCU, LABBARB  Alcohol Level No results found for: ETH  CT Head without contrast(personally reviewed): CTH was negative for a large hypodensity concerning for a large territory infarct or hyperdensity concerning for an ICH  MRI Brain(personally reviewed): Restricted diffusion in a right lateral lenticulostriate distribution without FLAIR changes.  Impression   Gabrella Stroh is a 87 y.o. female with PMH significant for HTN, HLD, OA, who presents with L sided weakness and Left facial droop with MRI demonstrating restricted diffusion in R lenticulostriate distribution with no corresponding flair abnormalities. Her neurologic examination is notable for L facial droop, LUE and LLE weakness.  Primary Diagnosis:  Other cerebral infarction due to occlusion of stenosis of small artery.  Secondary Diagnosis: Essential (primary) hypertension  Recommendations   Plan: - Frequent NeuroChecks for post tPA care per stroke unit protocol: - Initial CTH demonstrated no acute hemorrhage or  mass - MRI Brain - estricted diffusion in R lenticulostriate distribution with no corresponding flair abnormalities. - MRA head and Carotid duplex pending - TTE - pending - Lipid Panel: LDL - pending  - Statin: if LDL > 70 - HbA1c: pending. - Antithrombotic: Start ASA 81 mg daily if 24 h CTH does not show acute hemorrhage - DVT prophylaxis: SCDs. Pharmacologic prophylaxis if 24 h CTH does not demonstrate acute hemorrhage - Systolic Blood Pressure goal: < 180 mm Hg - Telemetry monitoring for arrhythmia: 72 hours - Swallow screen - ordered - PT/OT/SLP consults  HTN: - Hold home AntiHTNives - SBP goal post tPA as above  HLD: - LDL pending - Statins if LDL > 70 ______________________________________________________________________  This patient is critically ill and at significant risk of neurological worsening, death and care requires constant monitoring of vital signs, hemodynamics,respiratory and cardiac monitoring, neurological assessment, discussion with family, other specialists and medical decision making of high complexity. I spent 90 minutes of neurocritical care time  in the care of  this patient. This was time spent independent of any time provided by nurse practitioner or PA.  Erick Blinks [facility] Pager Number [phone] 01/11/2021  10:06 AM   Thank you for the opportunity to take part in the care of this patient. If you have any further questions, please contact the neurology consultation attending.  Signed,  Erick Blinks [facility] Pager Number [phone] _ _ _   _ __   _ __ _ _  __ __   _ __   __ _     Walking yesterday. Today she could not move her legs. She tried to stand up and could not move her. They heard her talking garbled.

## 2021-01-20 ENCOUNTER — Inpatient Hospital Stay (HOSPITAL_COMMUNITY): Payer: Medicare Other

## 2021-01-20 DIAGNOSIS — G9340 Encephalopathy, unspecified: Secondary | ICD-10-CM

## 2021-01-20 DIAGNOSIS — K922 Gastrointestinal hemorrhage, unspecified: Secondary | ICD-10-CM

## 2021-01-20 DIAGNOSIS — D62 Acute posthemorrhagic anemia: Secondary | ICD-10-CM

## 2021-01-20 DIAGNOSIS — I639 Cerebral infarction, unspecified: Secondary | ICD-10-CM

## 2021-01-20 DIAGNOSIS — N179 Acute kidney failure, unspecified: Secondary | ICD-10-CM

## 2021-01-20 DIAGNOSIS — Z9282 Status post administration of tPA (rtPA) in a different facility within the last 24 hours prior to admission to current facility: Secondary | ICD-10-CM

## 2021-01-20 LAB — LIPID PANEL
Cholesterol: 129 mg/dL (ref 0–200)
HDL: 22 mg/dL — ABNORMAL LOW (ref >40)
LDL Cholesterol: 86 mg/dL (ref 0–99)
Total CHOL/HDL Ratio: 5.9 RATIO
Triglycerides: 103 mg/dL (ref <150)
VLDL: 21 mg/dL (ref 0–40)

## 2021-01-20 LAB — POCT I-STAT EG7
Acid-base deficit: 6 mmol/L — ABNORMAL HIGH (ref 0.0–2.0)
Bicarbonate: 19.2 mmol/L — ABNORMAL LOW (ref 20.0–28.0)
Calcium, Ion: 1.11 mmol/L — ABNORMAL LOW (ref 1.15–1.40)
HCT: 34 % — ABNORMAL LOW (ref 36.0–46.0)
Hemoglobin: 11.6 g/dL — ABNORMAL LOW (ref 12.0–15.0)
O2 Saturation: 60 %
Patient temperature: 97.2
Potassium: 4 mmol/L (ref 3.5–5.1)
Sodium: 143 mmol/L (ref 135–145)
TCO2: 20 mmol/L — ABNORMAL LOW (ref 22–32)
pCO2, Ven: 35.9 mmHg — ABNORMAL LOW (ref 44.0–60.0)
pH, Ven: 7.334 (ref 7.250–7.430)
pO2, Ven: 31 mmHg — CL (ref 32.0–45.0)

## 2021-01-20 LAB — BPAM PLATELET PHERESIS
Blood Product Expiration Date: 202206282359
Blood Product Expiration Date: 202206282359
Unit Type and Rh: 5100
Unit Type and Rh: 6200

## 2021-01-20 LAB — PREPARE RBC (CROSSMATCH)

## 2021-01-20 LAB — BASIC METABOLIC PANEL
Anion gap: 11 (ref 5–15)
BUN: 31 mg/dL — ABNORMAL HIGH (ref 8–23)
CO2: 18 mmol/L — ABNORMAL LOW (ref 22–32)
Calcium: 7.4 mg/dL — ABNORMAL LOW (ref 8.9–10.3)
Chloride: 110 mmol/L (ref 98–111)
Creatinine, Ser: 1.24 mg/dL — ABNORMAL HIGH (ref 0.44–1.00)
GFR, Estimated: 41 mL/min — ABNORMAL LOW (ref >60)
Glucose, Bld: 141 mg/dL — ABNORMAL HIGH (ref 70–99)
Potassium: 3.8 mmol/L (ref 3.5–5.1)
Sodium: 139 mmol/L (ref 135–145)

## 2021-01-20 LAB — LACTIC ACID, PLASMA
Lactic Acid, Venous: 2.6 mmol/L (ref 0.5–1.9)
Lactic Acid, Venous: 1.9 mmol/L (ref 0.5–1.9)

## 2021-01-20 LAB — MAGNESIUM: Magnesium: 1.6 mg/dL — ABNORMAL LOW (ref 1.7–2.4)

## 2021-01-20 LAB — HEMOGLOBIN AND HEMATOCRIT, BLOOD
HCT: 35.2 % — ABNORMAL LOW (ref 36.0–46.0)
Hemoglobin: 11.5 g/dL — ABNORMAL LOW (ref 12.0–15.0)

## 2021-01-20 LAB — PHOSPHORUS: Phosphorus: 5.4 mg/dL — ABNORMAL HIGH (ref 2.5–4.6)

## 2021-01-20 MED ORDER — ONDANSETRON HCL 4 MG/2ML IJ SOLN: 4.0000 mg | Freq: Four times a day (QID) | INTRAMUSCULAR | Status: DC | PRN

## 2021-01-20 MED ORDER — ACETAMINOPHEN 650 MG RE SUPP: 650.0000 mg | Freq: Four times a day (QID) | RECTAL | Status: DC | PRN

## 2021-01-20 MED ORDER — BIOTENE DRY MOUTH MT LIQD: 15.0000 mL | OROMUCOSAL | Status: DC | PRN

## 2021-01-20 MED ORDER — HALOPERIDOL LACTATE 5 MG/ML IJ SOLN: 0.5000 mg | INTRAMUSCULAR | Status: DC | PRN

## 2021-01-20 MED ORDER — SODIUM CHLORIDE 0.9% IV SOLUTION: Freq: Once | INTRAVENOUS | Status: AC

## 2021-01-20 MED ORDER — SODIUM CHLORIDE 0.9 % IV SOLN
INTRAVENOUS | Status: DC | PRN
  Administered 2021-01-20: 500 mL via INTRAVENOUS

## 2021-01-20 MED ORDER — HALOPERIDOL 0.5 MG PO TABS
0.5000 mg | ORAL_TABLET | ORAL | Status: DC | PRN
  Filled 2021-01-20: qty 1

## 2021-01-20 MED ORDER — HALOPERIDOL LACTATE 2 MG/ML PO CONC
0.5000 mg | ORAL | Status: DC | PRN
  Filled 2021-01-20: qty 0.3

## 2021-01-20 MED ORDER — ACETAMINOPHEN 325 MG PO TABS: 650.0000 mg | ORAL_TABLET | Freq: Four times a day (QID) | ORAL | Status: DC | PRN

## 2021-01-20 MED ORDER — GLYCOPYRROLATE 1 MG PO TABS
1.0000 mg | ORAL_TABLET | ORAL | Status: DC | PRN
  Filled 2021-01-20: qty 1

## 2021-01-20 MED ORDER — LACTATED RINGERS IV SOLN: INTRAVENOUS | Status: DC

## 2021-01-20 MED ORDER — POLYVINYL ALCOHOL 1.4 % OP SOLN
1.0000 [drp] | Freq: Four times a day (QID) | OPHTHALMIC | Status: DC | PRN
  Filled 2021-01-20: qty 15

## 2021-01-20 MED ORDER — IOHEXOL 350 MG/ML SOLN
100.0000 mL | Freq: Once | INTRAVENOUS | Status: AC | PRN
  Administered 2021-01-20: 100 mL via INTRAVENOUS

## 2021-01-20 MED ORDER — TRANEXAMIC ACID-NACL 1000-0.7 MG/100ML-% IV SOLN
1000.0000 mg | INTRAVENOUS | Status: AC
  Administered 2021-01-20: 1000 mg via INTRAVENOUS
  Filled 2021-01-20: qty 100

## 2021-01-20 MED ORDER — SODIUM CHLORIDE 0.9 % IV BOLUS
1000.0000 mL | Freq: Once | INTRAVENOUS | Status: AC
  Administered 2021-01-20: 1000 mL via INTRAVENOUS

## 2021-01-20 MED ORDER — SCOPOLAMINE 1 MG/3DAYS TD PT72
1.0000 | MEDICATED_PATCH | TRANSDERMAL | Status: DC
  Administered 2021-01-20: 1.5 mg via TRANSDERMAL
  Filled 2021-01-20: qty 1

## 2021-01-20 MED ORDER — LABETALOL HCL 5 MG/ML IV SOLN
10.0000 mg | INTRAVENOUS | Status: DC | PRN
  Administered 2021-01-20: 10 mg via INTRAVENOUS
  Filled 2021-01-20: qty 4

## 2021-01-20 MED ORDER — ONDANSETRON 4 MG PO TBDP: 4.0000 mg | ORAL_TABLET | Freq: Four times a day (QID) | ORAL | Status: DC | PRN

## 2021-01-20 MED ORDER — MAGNESIUM SULFATE 2 GM/50ML IV SOLN
2.0000 g | Freq: Once | INTRAVENOUS | Status: AC
  Administered 2021-01-20: 2 g via INTRAVENOUS
  Filled 2021-01-20: qty 50

## 2021-01-20 MED ORDER — MORPHINE 100MG IN NS 100ML (1MG/ML) PREMIX INFUSION
1.0000 mg/h | INTRAVENOUS | Status: DC
  Administered 2021-01-20: 1 mg/h via INTRAVENOUS
  Administered 2021-01-21: 2 mg/h via INTRAVENOUS
  Filled 2021-01-20 (×2): qty 100

## 2021-01-20 MED ORDER — GLYCOPYRROLATE 0.2 MG/ML IJ SOLN
0.2000 mg | INTRAMUSCULAR | Status: DC | PRN
  Filled 2021-01-20: qty 1

## 2021-01-20 MED ORDER — MORPHINE BOLUS VIA INFUSION
4.0000 mg | Freq: Once | INTRAVENOUS | Status: DC
  Administered 2021-01-20: 4 mg via INTRAVENOUS
  Filled 2021-01-20: qty 4

## 2021-01-20 MED ORDER — GLYCOPYRROLATE 0.2 MG/ML IJ SOLN
0.2000 mg | INTRAMUSCULAR | Status: DC | PRN
  Administered 2021-01-21 – 2021-01-22 (×2): 0.2 mg via INTRAVENOUS
  Filled 2021-01-20 (×4): qty 1

## 2021-01-20 MED ORDER — PANTOPRAZOLE SODIUM 40 MG IV SOLR
40.0000 mg | Freq: Every day | INTRAVENOUS | Status: DC
  Administered 2021-01-20: 40 mg via INTRAVENOUS
  Filled 2021-01-20: qty 40

## 2021-01-20 NOTE — Progress Notes (Signed)
eLink Physician-Brief Progress Note Patient Name: Kimberly Rasmussen DOB: 06-09-1926 MRN: [id number]   Date of Service  01/20/2021  HPI/Events of Note  Lactic Acid = 2.6. LVEF = 65-70%.  eICU Interventions  Plan: Bolus with 0.9 NaCl 1 liter IV over 1 hour now. Continue to trend Lactic Acid.     Intervention Category Major Interventions: Acid-Base disturbance - evaluation and management  Jamis Kryder Eugene 01/20/2021, 3:21 AM

## 2021-01-20 NOTE — Consult Note (Addendum)
Reason for Consult:Rectal bleeding. Referring Physician: [facility].  Kimberly Rasmussen is an 87 y.o. female.  HPI: Kimberly Rasmussen is a 87 year old black female, with multiple medical problems listed below, admitted with a stroke. She developed rectal bleeding after she received TPA resulting in hemorrhagic shock. She has had a diverticular bleed in the past in 2016. The bleeding this time was thought to be due to diverticulosis but no source of bleeding could be identified on a CTA.  tPA was reversed be due to hemorrhagic shock.  Patient's history is complicated by hypertension, dyslipidemia, AKI, diverticulosis/diverticulitis and a recent stroke with a massive GI bleed.  Most of the history is been procured from the chart as the patient is nonverbal. Past Medical History:  Diagnosis Date   AKI (acute kidney injury) (HCC) 09/19/2018   Arthritis    Diverticulitis    Diverticulosis    Dyslipidemia    Dysrhythmia    irregular heart beat   HTN (hypertension)    Jaundice, obstructive, intrahepatic    s/p ERCP, lithotripsy, and stone removal 01/2011, 02/2011   PONV (postoperative nausea and vomiting)    N&V when awakening from anesthesia   Shortness of breath    saw cardiologist for this   UTI (urinary tract infection)    history of UTI   Past Surgical History:  Procedure Laterality Date   CHOLECYSTECTOMY     removal of gallstones x4 (?ERCP)   ERCP     with lithotripsy and gall stone removal 2012   ESOPHAGOGASTRODUODENOSCOPY N/A 04/11/2015   Procedure: ESOPHAGOGASTRODUODENOSCOPY (EGD);  Surgeon: Jeani Hawking, MD;  Location: [facility] ENDOSCOPY;  Service: Endoscopy;  Laterality: N/A;   FOOT SURGERY     x2   KNEE SURGERY     LEFT KNEE   TOTAL ABDOMINAL HYSTERECTOMY     TOTAL KNEE ARTHROPLASTY     RIGHT KNEE   TOTAL KNEE ARTHROPLASTY  07/13/2011   Procedure: TOTAL KNEE ARTHROPLASTY;  Surgeon: Nestor Lewandowsky;  Location: [facility] OR;  Service: Orthopedics;  Laterality: Left;  Left Total Knee  Arthroplasty with Hardware Removal     Family History  Problem Relation Age of Onset   Pneumonia Mother    Lung cancer Sister    Hypertension Maternal Grandmother    Stroke Maternal Grandmother     Social History:  reports that she quit smoking about 24 years ago. Her smoking use included cigarettes. She has a 20.00 pack-year smoking history. She has never used smokeless tobacco. She reports current alcohol use of about 1.0 standard drink of alcohol per week. She reports that she does not use drugs.  Allergies:  Allergies  Allergen Reactions   Amitriptyline Other (See Comments)    Hallucinations    Gabapentin Other (See Comments)    Hallucinations    Meloxicam Other (See Comments)    Hallucinations    Tramadol Other (See Comments)    Hallucinations     Medications: I have reviewed the patient's current medications.  Results for orders placed or performed during the hospital encounter of 12/29/2020 (from the past 48 hour(s))  Protime-INR     Status: None   Collection Time: 01/21/2021  8:39 AM  Result Value Ref Range   Prothrombin Time 13.4 11.4 - 15.2 seconds   INR 1.0 0.8 - 1.2    Comment: (NOTE) INR goal varies based on device and disease states. Performed at [facility], [address], [place], [place] [address]   APTT  Status: None   Collection Time: Feb 11, 2021  8:39 AM  Result Value Ref Range   aPTT 34 24 - 36 seconds    Comment: Performed at [facility], [address] [place], [place] [address]  CBC     Status: None   Collection Time: 02-11-21  8:39 AM  Result Value Ref Range   WBC 7.8 4.0 - 10.5 K/uL   RBC 4.58 3.87 - 5.11 MIL/uL   Hemoglobin 13.3 12.0 - 15.0 g/dL   HCT 84.1 32.4 - 40.1 %   MCV 90.2 80.0 - 100.0 fL   MCH 29.0 26.0 - 34.0 pg   MCHC 32.2 30.0 - 36.0 g/dL   RDW 02.7 25.3 - 66.4 %   Platelets 384 150 - 400 K/uL   nRBC 0.0 0.0 - 0.2 %    Comment: Performed at [facility] Lab, [address], [place], [place] [address]   Differential     Status: None   Collection Time: February 11, 2021  8:39 AM  Result Value Ref Range   Neutrophils Relative % 72 %   Neutro Abs 5.8 1.7 - 7.7 K/uL   Lymphocytes Relative 18 %   Lymphs Abs 1.4 0.7 - 4.0 K/uL   Monocytes Relative 8 %   Monocytes Absolute 0.6 0.1 - 1.0 K/uL   Eosinophils Relative 1 %   Eosinophils Absolute 0.0 0.0 - 0.5 K/uL   Basophils Relative 0 %   Basophils Absolute 0.0 0.0 - 0.1 K/uL   Immature Granulocytes 1 %   Abs Immature Granulocytes 0.04 0.00 - 0.07 K/uL    Comment: Performed at [facility] Lab, [address], [place], [place] [address]  Comprehensive metabolic panel     Status: Abnormal   Collection Time: 02/11/21  8:39 AM  Result Value Ref Range   Sodium 140 135 - 145 mmol/L   Potassium 4.0 3.5 - 5.1 mmol/L   Chloride 109 98 - 111 mmol/L   CO2 22 22 - 32 mmol/L   Glucose, Bld 97 70 - 99 mg/dL    Comment: Glucose reference range applies only to samples taken after fasting for at least 8 hours.   BUN 21 8 - 23 mg/dL   Creatinine, Ser 6.38 0.44 - 1.00 mg/dL   Calcium 9.7 8.9 - 75.6 mg/dL   Total Protein 7.0 6.5 - 8.1 g/dL   Albumin 3.4 (L) 3.5 - 5.0 g/dL   AST 18 15 - 41 U/L   ALT 12 0 - 44 U/L   Alkaline Phosphatase 57 38 - 126 U/L   Total Bilirubin 0.4 0.3 - 1.2 mg/dL   GFR, Estimated 60 (L) >60 mL/min    Comment: (NOTE) Calculated using the CKD-EPI Creatinine Equation (2021)    Anion gap 9 5 - 15    Comment: Performed at [facility] Lab, [address], [place], [place] [address]  CBG monitoring, ED     Status: None   Collection Time: 2021/02/11  8:40 AM  Result Value Ref Range   Glucose-Capillary 85 70 - 99 mg/dL    Comment: Glucose reference range applies only to samples taken after fasting for at least 8 hours.  I-stat chem 8, ED     Status: None   Collection Time: 02-11-21  8:48 AM  Result Value Ref Range   Sodium 142 135 - 145 mmol/L   Potassium 3.9 3.5 - 5.1 mmol/L   Chloride 108 98 - 111 mmol/L   BUN 21 8 - 23  mg/dL    Creatinine, Ser 1.61 0.44 - 1.00 mg/dL   Glucose, Bld 97 70 - 99 mg/dL    Comment: Glucose reference range applies only to samples taken after fasting for at least 8 hours.   Calcium, Ion 1.26 1.15 - 1.40 mmol/L   TCO2 24 22 - 32 mmol/L   Hemoglobin 13.6 12.0 - 15.0 g/dL   HCT 09.6 04.5 - 40.9 %  SARS CORONAVIRUS 2 (TAT 6-24 HRS) Nasopharyngeal Nasopharyngeal Swab     Status: None   Collection Time: January 20, 2021 11:04 AM   Specimen: Nasopharyngeal Swab  Result Value Ref Range   SARS Coronavirus 2 NEGATIVE NEGATIVE    Comment: (NOTE) SARS-CoV-2 target nucleic acids are NOT DETECTED.  The SARS-CoV-2 RNA is generally detectable in upper and lower respiratory specimens during the acute phase of infection. Negative results do not preclude SARS-CoV-2 infection, do not rule out co-infections with other pathogens, and should not be used as the sole basis for treatment or other patient management decisions. Negative results must be combined with clinical observations, patient history, and epidemiological information. The expected result is Negative.  Fact Sheet for Patients: HairSlick.no  Fact Sheet for Healthcare Providers: quierodirigir.com  This test is not yet approved or cleared by the [place] FDA and  has been authorized for detection and/or diagnosis of SARS-CoV-2 by FDA under an Emergency Use Authorization (EUA). This EUA will remain  in effect (meaning this test can be used) for the duration of the COVID-19 declaration under Se ction 564(b)(1) of the Act, 21 U.S.C. section 360bbb-3(b)(1), unless the authorization is terminated or revoked sooner.  Performed at [facility], [address], [place], [place] [address]   MRSA Next Gen by PCR, Nasal     Status: None   Collection Time: 01/20/2021 11:48 AM   Specimen: Nasal Mucosa; Nasal Swab  Result Value Ref Range   MRSA by PCR Next Gen NOT DETECTED NOT DETECTED     Comment: (NOTE) The GeneXpert MRSA Assay (FDA approved for NASAL specimens only), is one component of a comprehensive MRSA colonization surveillance program. It is not intended to diagnose MRSA infection nor to guide or monitor treatment for MRSA infections. Test performance is not FDA approved in patients less than 26 years old. Performed at [facility] Lab, [address] [place], [place] [address]   Type and screen [facility]     Status: None (Preliminary result)   Collection Time: 20-Jan-2021  1:03 PM  Result Value Ref Range   ABO/RH(D) O POS    Antibody Screen NEG    Sample Expiration 12/31/2020,2359    Unit Number [id number]    Blood Component Type RED CELLS,LR    Unit division 00    Status of Unit ISSUED    Transfusion Status OK TO TRANSFUSE    Crossmatch Result Compatible    Unit Number [id number]    Blood Component Type RED CELLS,LR    Unit division 00    Status of Unit ISSUED    Transfusion Status OK TO TRANSFUSE    Crossmatch Result      Compatible Performed at [facility] Lab, [address], [place], [place] [address]    Unit Number [id number]    Blood Component Type RED CELLS,LR    Unit division 00    Status of Unit ISSUED    Transfusion Status OK TO TRANSFUSE    Crossmatch Result Compatible    Unit Number [id number]  Blood Component Type RED CELLS,LR    Unit division 00    Status of Unit ISSUED    Transfusion Status OK TO TRANSFUSE    Crossmatch Result Compatible   Glucose, capillary     Status: Abnormal   Collection Time: 01/11/2021  4:11 PM  Result Value Ref Range   Glucose-Capillary 107 (H) 70 - 99 mg/dL    Comment: Glucose reference range applies only to samples taken after fasting for at least 8 hours.  Hemoglobin and hematocrit, blood     Status: Abnormal   Collection Time: 01/18/2021  4:44 PM  Result Value Ref Range   Hemoglobin 11.0 (L) 12.0 - 15.0 g/dL   HCT 16.1 (L) 09.6 - 04.5 %    Comment: Performed at  [facility] Lab, [address] [place], [place] [address]  Fibrinogen     Status: Abnormal   Collection Time: 01/03/2021  4:44 PM  Result Value Ref Range   Fibrinogen 200 (L) 210 - 475 mg/dL    Comment: Performed at [facility], [address], [place], [place] [address]  Prepare RBC (crossmatch)     Status: None   Collection Time: 01/13/2021  4:51 PM  Result Value Ref Range   Order Confirmation      ORDER PROCESSED BY BLOOD BANK Performed at [facility] Lab, [address], [place], [place] [address]   Prepare RBC (crossmatch)     Status: None   Collection Time: 01/10/2021  5:41 PM  Result Value Ref Range   Order Confirmation      ORDER PROCESSED BY BLOOD BANK Performed at [facility], [address] [place], [place] [address]   Glucose, capillary     Status: Abnormal   Collection Time: 01/12/2021  7:35 PM  Result Value Ref Range   Glucose-Capillary 115 (H) 70 - 99 mg/dL    Comment: Glucose reference range applies only to samples taken after fasting for at least 8 hours.  Hemoglobin and hematocrit, blood     Status: Abnormal   Collection Time: 01/21/2021 11:08 PM  Result Value Ref Range   Hemoglobin 11.5 (L) 12.0 - 15.0 g/dL   HCT 65.7 84.6 - 96.2 %    Comment: Performed at [facility] Lab, [address] [place], [place] [address]  Prepare RBC (crossmatch)     Status: None   Collection Time: 01/20/21 12:05 AM  Result Value Ref Range   Order Confirmation      ORDER PROCESSED BY BLOOD BANK Performed at [facility], [address], [place], [place] [address]   Prepare Pheresed Platelets     Status: None   Collection Time: 01/20/21  2:01 AM  Result Value Ref Range   Unit Number [id number]    Blood Component Type PLTP2 PSORALEN TREATED    Unit division 00    Status of Unit REL FROM [facility]    Transfusion Status OK TO TRANSFUSE    Unit Number [id number]    Blood Component Type PLTP2 PSORALEN TREATED    Unit division 00    Status of Unit REL  FROM [facility]    Transfusion Status OK TO TRANSFUSE   Prepare fresh frozen plasma     Status: None (Preliminary result)   Collection Time: 01/20/21  2:01 AM  Result Value Ref Range   Unit Number [id number]    Blood Component Type THAWED PLASMA    Unit division 00    Status of Unit  ISSUED    Transfusion Status      OK TO TRANSFUSE Performed at [facility] Lab, [address], [place], [place] [address]    Unit Number [id number]    Blood Component Type THAWED PLASMA    Unit division 00    Status of Unit ISSUED    Transfusion Status OK TO TRANSFUSE   Lactic acid, plasma     Status: Abnormal   Collection Time: 01/20/21  2:07 AM  Result Value Ref Range   Lactic Acid, Venous 2.6 (HH) 0.5 - 1.9 mmol/L    Comment: CRITICAL RESULT CALLED TO, READ BACK BY AND VERIFIED WITH: BAILIFF A,RN 01/20/21 0307 WAYK Performed at [facility] Lab, [address] [place], [place] [address]   Basic metabolic panel     Status: Abnormal   Collection Time: 01/20/21  2:07 AM  Result Value Ref Range   Sodium 139 135 - 145 mmol/L   Potassium 3.8 3.5 - 5.1 mmol/L   Chloride 110 98 - 111 mmol/L   CO2 18 (L) 22 - 32 mmol/L   Glucose, Bld 141 (H) 70 - 99 mg/dL    Comment: Glucose reference range applies only to samples taken after fasting for at least 8 hours.   BUN 31 (H) 8 - 23 mg/dL   Creatinine, Ser 1.19 (H) 0.44 - 1.00 mg/dL   Calcium 7.4 (L) 8.9 - 10.3 mg/dL   GFR, Estimated 41 (L) >60 mL/min    Comment: (NOTE) Calculated using the CKD-EPI Creatinine Equation (2021)    Anion gap 11 5 - 15    Comment: Performed at [facility] Lab, [address], [place], [place] [address]  Phosphorus     Status: Abnormal   Collection Time: 01/20/21  2:07 AM  Result Value Ref Range   Phosphorus 5.4 (H) 2.5 - 4.6 mg/dL    Comment: Performed at [facility] Lab, [address] [place], [place] [address]  Magnesium     Status: Abnormal   Collection Time: 01/20/21  2:07 AM  Result Value Ref Range    Magnesium 1.6 (L) 1.7 - 2.4 mg/dL    Comment: Performed at [facility] Lab, [address] [place], [place] [address]  POCT I-Stat EG7     Status: Abnormal   Collection Time: 01/20/21  2:30 AM  Result Value Ref Range   pH, Ven 7.334 7.250 - 7.430   pCO2, Ven 35.9 (L) 44.0 - 60.0 mmHg   pO2, Ven 31.0 (LL) 32.0 - 45.0 mmHg   Bicarbonate 19.2 (L) 20.0 - 28.0 mmol/L   TCO2 20 (L) 22 - 32 mmol/L   O2 Saturation 60.0 %   Acid-base deficit 6.0 (H) 0.0 - 2.0 mmol/L   Sodium 143 135 - 145 mmol/L   Potassium 4.0 3.5 - 5.1 mmol/L   Calcium, Ion 1.11 (L) 1.15 - 1.40 mmol/L   HCT 34.0 (L) 36.0 - 46.0 %   Hemoglobin 11.6 (L) 12.0 - 15.0 g/dL   Patient temperature 14.9 F    Sample type VENOUS   Lipid panel     Status: Abnormal   Collection Time: 01/20/21  5:21 AM  Result Value Ref Range   Cholesterol 129 0 - 200 mg/dL   Triglycerides 702 <637 mg/dL   HDL 22 (L) >85 mg/dL   Total CHOL/HDL Ratio 5.9 RATIO   VLDL 21 0 - 40 mg/dL   LDL Cholesterol 86 0 - 99 mg/dL    Comment:  Total Cholesterol/HDL:CHD Risk Coronary Heart Disease Risk Table                     Men   Women  1/2 Average Risk   3.4   3.3  Average Risk       5.0   4.4  2 X Average Risk   9.6   7.1  3 X Average Risk  23.4   11.0        Use the calculated Patient Ratio above and the CHD Risk Table to determine the patient's CHD Risk.        ATP III CLASSIFICATION (LDL):  <100     mg/dL   Optimal  235-573  mg/dL   Near or Above                    Optimal  130-159  mg/dL   Borderline  220-254  mg/dL   High  >270     mg/dL   Very High Performed at [facility], [address] [place], [place] [address]   Lactic acid, plasma     Status: None   Collection Time: 01/20/21  5:21 AM  Result Value Ref Range   Lactic Acid, Venous 1.9 0.5 - 1.9 mmol/L    Comment: Performed at [facility], [address] [place], [place] [address]  Hemoglobin and hematocrit, blood     Status: Abnormal   Collection Time:  01/20/21  5:26 AM  Result Value Ref Range   Hemoglobin 11.5 (L) 12.0 - 15.0 g/dL   HCT 17.6 (L) 16.0 - 73.7 %    Comment: Performed at [facility] Lab, [address], [place], [place] [address]    CT HEAD WO CONTRAST  Result Date: 01/20/2021 CLINICAL DATA:  Follow-up post tPA.  Stroke. EXAM: CT HEAD WITHOUT CONTRAST TECHNIQUE: Contiguous axial images were obtained from the base of the skull through the vertex without intravenous contrast. COMPARISON:  01/12/2021 FINDINGS: Brain: Again noted is the acute infarct in the lenticulostriate region on the right. No real change since prior study. No hemorrhage. No new areas of acute infarct or hydrocephalus. Vascular: No hyperdense vessel or unexpected calcification. Skull: No acute calvarial abnormality. Sinuses/Orbits: No acute findings Other: None IMPRESSION: Stable acute right lenticulostriate infarct. No evidence of hemorrhage. Electronically Signed   By: Charlett Nose M.D.   On: 01/20/2021 02:06   CT HEAD WO CONTRAST  Result Date: 01/01/2021 CLINICAL DATA:  Cerebral hemorrhage suspected.  Status post tPA. EXAM: CT HEAD WITHOUT CONTRAST TECHNIQUE: Contiguous axial images were obtained from the base of the skull through the vertex without intravenous contrast. COMPARISON:  January 19, 2021 CT head. FINDINGS: Brain: Mild right lenticulostriate hypoattenuation, compatible with infarct that was better seen on recent MRI. No evidence of acute hemorrhage. Similar atrophy and ex vacuo ventricular dilation. No hydrocephalus. No midline shift. Basal cisterns are patent. Vascular: No hyperdense vessel identified. Calcific atherosclerosis. Skull: No acute fracture. Sinuses/Orbits: Visualized sinuses are clear. No acute orbital findings. Other: No mastoid effusions. IMPRESSION: Evolving acute right lenticulostriate infarct without evidence of acute hemorrhage. No mass effect. Electronically Signed   By: Feliberto Harts MD   On: 12/25/2020 18:03   CT HEAD WO  CONTRAST  Result Date: 01/07/2021 CLINICAL DATA:  Stroke, post tPA administration, worsening neurologic defects. EXAM: CT HEAD WITHOUT CONTRAST TECHNIQUE: Contiguous axial images were obtained from the base of the skull through the vertex without intravenous contrast.  COMPARISON:  MRI 01/01/2021, CT 12/31/2020 FINDINGS: Brain: Slightly increase conspicuity of hypoattenuation in the right lenticulostriate distribution compatible with region of restricted diffusion on comparison MR imaging and corresponding to the site of patient's infarct. No significant interval expansion is discernible on CT imaging when compared to MR. No hyperdense hemorrhage. No extra-axial collection. No significant mass effect or midline shift. Background of diffuse parenchymal volume loss with ex vacuo dilatation of the ventricles. Findings are on a background of more diffuse patchy regions of white matter hypoattenuation typically associated with microvascular angiopathy in a patient of this age. Scattered benign dural calcifications. Cerebellar tonsils are normally position. Stable appearance of the midline intracranial structures. Vascular: Aortic Atherosclerosis (ICD10-I70.0). Skull: No calvarial fracture or suspicious osseous lesion. No scalp swelling or hematoma. Sinuses/Orbits: Paranasal sinuses and mastoid air cells are predominantly clear. Middle ear cavities are clear. Orbital structures are unremarkable aside from prior lens extractions. Other: None. IMPRESSION: Hypoattenuation the right lenticulostriate distribution compatible with known infarct. No significant interval expansion or hyperdense hemorrhage is identified. Background of parenchymal volume loss, microvascular angiopathy and intracranial atherosclerosis. Electronically Signed   By: Kreg Shropshire M.D.   On: 12/27/2020 15:23   MR BRAIN WO CONTRAST  Result Date: 12/31/2020 CLINICAL DATA:  Wake up stroke EXAM: MRI HEAD WITHOUT CONTRAST TECHNIQUE: Multiplanar, multiecho  pulse sequences of the brain and surrounding structures were obtained without intravenous contrast. COMPARISON:  Head CT from earlier today and brain MRI from 2 days ago FINDINGS: Brain: Wedge of restricted diffusion in the right corona radiata and posterior putamen without any associated FLAIR signal alteration. Generalized atrophy. No hemorrhage, hydrocephalus, or collection. Vascular: Not assessed due to limited protocol techniques. Skull and upper cervical spine: Not primarily assessed Sinuses/Orbits: Negative These results were called by telephone at the time of interpretation on 01/23/2021 at 9:16 am to provider [facility] , who verbally acknowledged these results. IMPRESSION: Restricted diffusion in a right lateral lenticulostriate distribution without FLAIR changes. Electronically Signed   By: Marnee Spring M.D.   On: 01/21/2021 09:16   DG CHEST PORT 1 VIEW  Result Date: 12/30/2020 CLINICAL DATA:  Central line placement EXAM: PORTABLE CHEST 1 VIEW COMPARISON:  December 28, 2013 FINDINGS: A left central line is been placed in the interval. The distal tip is flipped back on itself in the region of the proximal SVC. Recommend repositioning. No pneumothorax. No other changes. IMPRESSION: 1. The distal tip of the new left central line is flipped back on itself, projected over the SVC. No pneumothorax. Recommend repositioning of the line. 2. No other changes. These results will be called to the ordering clinician or representative by the Radiologist Assistant, and communication documented in the PACS or [employer]. Electronically Signed   By: Gerome Sam III M.D   On: 01/02/2021 18:53   ECHOCARDIOGRAM COMPLETE  Result Date: 01/06/2021    ECHOCARDIOGRAM REPORT   Patient Name:   JANIS SOL Date of Exam: 12/25/2020 Medical Rec #:  [id number]          Height:       60.0 in Accession #:    [id number]         Weight:       145.5 lb Date of Birth:  12-17-1925          BSA:          1.631 m  Patient Age:    89 years           BP:  136/97 mmHg Patient Gender: F                  HR:           43 bpm. Exam Location:  Inpatient Procedure: 2D Echo, Cardiac Doppler and Color Doppler Indications:    Stroke  History:        Patient has no prior history of Echocardiogram examinations.                 Risk Factors:Hypertension, Dyslipidemia and Sleep Apnea.  Sonographer:    Ross LudwigArthur Guy RDCS (AE) Referring Phys: [id number] [facility] Ltd[facility]MAN KHALIQDINA  Sonographer Comments: Image acquisition challenging due to respiratory motion. Patient movement. IMPRESSIONS  1. Left ventricular ejection fraction, by estimation, is 65 to 70%. The left ventricle has normal function. The left ventricle has no regional wall motion abnormalities. There is moderate asymmetric left ventricular hypertrophy of the septal segment. Left ventricular diastolic parameters are consistent with Grade I diastolic dysfunction (impaired relaxation).  2. Right ventricular systolic function is normal. The right ventricular size is normal. Tricuspid regurgitation signal is inadequate for assessing PA pressure.  3. The mitral valve is normal in structure. No evidence of mitral valve regurgitation.  4. The aortic valve was not well visualized. Aortic valve regurgitation is mild. No aortic stenosis is present. FINDINGS  Left Ventricle: Left ventricular ejection fraction, by estimation, is 65 to 70%. The left ventricle has normal function. The left ventricle has no regional wall motion abnormalities. The left ventricular internal cavity size was small. There is moderate  asymmetric left ventricular hypertrophy of the septal segment. Left ventricular diastolic parameters are consistent with Grade I diastolic dysfunction (impaired relaxation). Right Ventricle: The right ventricular size is normal. No increase in right ventricular wall thickness. Right ventricular systolic function is normal. Tricuspid regurgitation signal is inadequate for assessing PA pressure.  Left Atrium: Left atrial size was normal in size. Right Atrium: Right atrial size was not well visualized. Pericardium: There is no evidence of pericardial effusion. Presence of pericardial fat pad. Mitral Valve: The mitral valve is normal in structure. No evidence of mitral valve regurgitation. Tricuspid Valve: The tricuspid valve is normal in structure. Tricuspid valve regurgitation is trivial. Aortic Valve: The aortic valve was not well visualized. Aortic valve regurgitation is mild. Aortic regurgitation PHT measures 538 msec. No aortic stenosis is present. Aortic valve mean gradient measures 4.0 mmHg. Aortic valve peak gradient measures 9.1 mmHg. Aortic valve area, by VTI measures 2.67 cm. Pulmonic Valve: The pulmonic valve was not well visualized. Pulmonic valve regurgitation is not visualized. Aorta: The aortic root is normal in size and structure. IAS/Shunts: The interatrial septum was not well visualized.  LEFT VENTRICLE PLAX 2D LVIDd:         2.90 cm  Diastology LVIDs:         2.10 cm  LV e' medial:    4.79 cm/s LV PW:         1.40 cm  LV E/e' medial:  12.3 LV IVS:        1.40 cm  LV e' lateral:   9.90 cm/s LVOT diam:     2.10 cm  LV E/e' lateral: 5.9 LV SV:         57 LV SV Index:   35 LVOT Area:     3.46 cm  IVC IVC diam: 1.00 cm LEFT ATRIUM             Index LA diam:  2.70 cm 1.66 cm/m LA Vol (A2C):   28.9 ml 17.72 ml/m LA Vol (A4C):   34.6 ml 21.22 ml/m LA Biplane Vol: 31.9 ml 19.56 ml/m  AORTIC VALVE AV Area (Vmax):    2.11 cm AV Area (Vmean):   2.51 cm AV Area (VTI):     2.67 cm AV Vmax:           151.00 cm/s AV Vmean:          88.700 cm/s AV VTI:            0.214 m AV Peak Grad:      9.1 mmHg AV Mean Grad:      4.0 mmHg LVOT Vmax:         91.80 cm/s LVOT Vmean:        64.400 cm/s LVOT VTI:          0.165 m LVOT/AV VTI ratio: 0.77 AI PHT:            538 msec  AORTA Ao Root diam: 3.60 cm MITRAL VALVE MV Area (PHT): 2.42 cm     SHUNTS MV Decel Time: 314 msec     Systemic VTI:  0.16 m MV  E velocity: 58.70 cm/s   Systemic Diam: 2.10 cm MV A velocity: 122.00 cm/s MV E/A ratio:  0.48 Epifanio Lesches MD Electronically signed by Epifanio Lesches MD Signature Date/Time: 2021-01-30/3:20:13 PM    Final    CT HEAD CODE STROKE WO CONTRAST  Result Date: Jan 30, 2021 CLINICAL DATA:  Code stroke. EXAM: CT HEAD WITHOUT CONTRAST TECHNIQUE: Contiguous axial images were obtained from the base of the skull through the vertex without intravenous contrast. COMPARISON:  Brain MRI 01/18/2020 FINDINGS: Brain: No evidence of acute infarction, hemorrhage, hydrocephalus, extra-axial collection or mass lesion/mass effect. Generalized atrophy which is prominent. Vascular: No hyperdense vessel or unexpected calcification. Skull: Normal. Negative for fracture or focal lesion. Sinuses/Orbits: No acute finding. Other: These results were communicated to Dr Sal at 8:57 amon 07-07-2022by text page via the [facility] messaging system. ASPECTS [facility] Stroke Program Early CT Score) Not scored without localizing history IMPRESSION: 1. No acute finding. 2. Generalized atrophy. Electronically Signed   By: Marnee Spring M.D.   On: 30-Jan-2021 08:58   CT Angio Abd/Pel w/ and/or w/o  Result Date: 01/20/2021 CLINICAL DATA:  Gastrointestinal hemorrhage EXAM: CTA ABDOMEN AND PELVIS WITHOUT AND WITH CONTRAST TECHNIQUE: Multidetector CT imaging of the abdomen and pelvis was performed using the standard protocol during bolus administration of intravenous contrast. Multiplanar reconstructed images and MIPs were obtained and reviewed to evaluate the vascular anatomy. CONTRAST:  OMNIPAQUE IOHEXOL 350 MG/ML SOLN COMPARISON:  None. FINDINGS: VASCULAR Aorta: Moderate mixed atherosclerotic plaque. No evidence of hemodynamically significant stenosis. No aneurysm or dissection. No periaortic inflammatory change. Celiac: Widely patent. Normal anatomic configuration. No dissection or aneurysm. SMA: Less than 50% stenosis at the origin.  Distally widely patent. No aneurysm or dissection. Renals: Single renal arteries bilaterally are widely patent and demonstrate normal vascular morphology. No aneurysm. IMA: Patent. Inflow: The lower extremity arterial inflow is widely patent on the right. A focal less than 50% stenosis is seen of the left external iliac artery in its mid segment. The right internal iliac artery is patent at its origin. The left internal iliac artery demonstrates near occlusion at its origin, but is patent. 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 Distally, fusiform aneurysm of the internal iliac  arteries seen to the level of its anterior and posterior division. Proximal Outflow: Less than 50% stenosis of the left SFA at its origin. Otherwise widely patent. Veins: Unremarkable. Review of the MIP images confirms the above findings. NON-VASCULAR Lower chest: Mild bibasilar fibrotic change. Moderate coronary artery calcification. Global cardiac size within normal limits. Small hiatal hernia. Hepatobiliary: Surgical changes of probable partial right hepatectomy and cholecystectomy are identified. There is pneumobilia within the liver noted in keeping with prior sphincterotomy. Mild intrahepatic and moderate extrahepatic biliary ductal dilation may represent post cholecystectomy change. No focal intrahepatic masses identified. Pancreas: Unremarkable Spleen: Unremarkable Adrenals/Urinary Tract: The adrenal glands are unremarkable. The kidneys are normal in size and position. Exophytic 5.1 cm simple cortical cyst arises from the interpolar region of the left kidney. Additional tiny cortical cysts are seen bilaterally. The kidneys are otherwise unremarkable. The bladder is unremarkable. Stomach/Bowel: Evaluation of bowel is slightly limited by motion artifact. There is severe descending and sigmoid colonic diverticulosis. Scattered diverticulosis is seen throughout the proximal  colon. There is eccentric mural thickening involving the proximal sigmoid colon which may simply represent muscular hypertrophy in the setting of severe sigmoid diverticulosis, however, and eccentric mural mass with appear similarly. The stomach, small bowel, and large bowel are otherwise unremarkable. Appendix normal. No free intraperitoneal gas or fluid. Lymphatic: No pathologic adenopathy within the abdomen and pelvis. Reproductive: Status post hysterectomy. No adnexal masses. Other: No abdominal wall hernia. Musculoskeletal: Degenerative changes are seen within the lumbar spine. No acute bone abnormality. No lytic or blastic bone lesion. 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 NON-VASCULAR No evidence of active extravasation to account for the patient's reported gastrointestinal hemorrhage. Severe descending and sigmoid colonic diverticulosis. Eccentric mural thickening within the proximal sigmoid colon may simply represent the sequela of muscular hypertrophy, however, correlation with recent endoscopy is recommended to exclude the presence of an underlying mural mass. Aortic Atherosclerosis (ICD10-I70.0). Electronically Signed   By: Helyn Numbers MD   On: 01/20/2021 02:25    Review of Systems  Unable to perform ROS: Other  Blood pressure (!) 179/49, pulse 94, temperature 97.7 F (36.5 C), temperature source Axillary, resp. rate 16, weight 66 kg, SpO2 100 %. Physical Exam Constitutional:      Appearance: She is obese. She is ill-appearing.  HENT:     Head: Normocephalic and atraumatic.     Mouth/Throat:     Mouth: Mucous membranes are dry.  Eyes:     Extraocular Movements: Extraocular movements intact.     Pupils: Pupils are equal, round, and reactive to light.  Cardiovascular:     Pulses: Normal pulses.     Heart sounds: Normal heart sounds.  Abdominal:      General: Abdomen is flat. There is no distension.     Tenderness: There is no abdominal tenderness. There is no guarding.  Musculoskeletal:     Cervical back: Neck supple.  Skin:    General: Skin is warm and dry.  Neurological:     Comments: Nonverbal noncommunicative not not responding to commands   Assessment/Plan: Hemorrhagic shock secondary to GI bleed-thought to be secondary diverticulosis-status post tPA given for stroke [right lateral lenticulostriate distribution], complicated by acute encephalopathy-in light of the patient's multiple medical issues as as per my discussion with her daughter Othella Boyer, plans have been made to make her DNR and keep her comfortable. I think this  is the most reasonable decision under the present circumstances. Bonita Quin is her mother's power of attorney and has a death had a discussion with her sister as well who is in agreement with the plans for comfort care.  Please call if any questions or concerns. Charna Elizabeth 01/20/2021, 7:16 AM

## 2021-01-20 NOTE — Progress Notes (Signed)
Patient initially had cessation of active signs of bleeding and was hemodynamically stable, therefore in the setting of almost normal fibrinogen, no tpa reversal was undertaken. She had 2 units of PRBC administered. Her BP has very recently begun trending downwards and she has now saturated multiple pads with dark reddish colored bloody stool. I suspect that she has had recurrent bleeding and now her mental status is declining as well.   She continues to have a left hemiparesis, but now eyes are partially open and she is not following commands. Eyes with mild rightward preference. Breathing is labored and sonorus.   Given that she is now having bleeding within 24 hours of tPA, I think that TXA is indicated and I have ordered this. With normal fibrinogen, not sure how much benefit we will get from cryoprecipitate.   1) Transfuse additional 2u PRBC 2) fluid resuscitation as well.  3) I have consulted GI who recommend CTA abd/pelvis 4) will consult CCM 5) nasal trumpet.  6) will get head CT  7) TXA 1000 units 8) I confirmed DNI status with the daughter.   This patient is critically ill and at significant risk of neurological worsening, death and care requires constant monitoring of vital signs, hemodynamics,respiratory and cardiac monitoring, neurological assessment, discussion with family, other specialists and medical decision making of high complexity. I spent 30 minutes of neurocritical care time  in the care of  this patient. This was time spent independent of any time provided by nurse practitioner or PA.  Ritta Slot, MD [facility] [phone]  If 7pm- 7am, please page neurology on call as listed in AMION. 01/20/2021  12:49 AM

## 2021-01-20 NOTE — Progress Notes (Signed)
SLP Cancellation Note  Patient Details Name: Kimberly Rasmussen MRN: [id number] DOB: 21-Aug-1925   Cancelled treatment:    Orders discontinued per Dr. Roda Shutters.  Please reconsult if we can be of service.  Chiquitta Matty L. Samson Frederic, MA CCC/SLP Acute Rehabilitation Services Office number [phone] Pager [phone]        Blenda Mounts Laurice 01/20/2021, 11:11 AM

## 2021-01-20 NOTE — Progress Notes (Signed)
OT Cancellation Note  Patient Details Name: Kimberly Rasmussen MRN: [id number] DOB: November 04, 1925   Cancelled Treatment:    Reason Eval/Treat Not Completed: Patient not medically ready OT order received and appreciated however this conflicts with current bedrest order set. Please increase activity tolerance as appropriate and remove bedrest from orders. . Please contact OT at [phone] if bed rest order is discontinued. OT will hold evaluation at this time and will check back as time allows pending increased activity orders.   Wynona Neat, OTR/L  Acute Rehabilitation Services Pager: [phone] Office: [phone] .  01/20/2021, 8:12 AM

## 2021-01-20 NOTE — Progress Notes (Signed)
PT Cancellation Note  Patient Details Name: Maricsa Sammons MRN: [id number] DOB: 06/21/1926   Cancelled Treatment:    Reason Eval/Treat Not Completed: Active bedrest order  Lillia Pauls, PT, DPT Acute Rehabilitation Services Pager [phone] Office [phone]    Norval Morton 01/20/2021, 8:13 AM

## 2021-01-20 NOTE — Progress Notes (Signed)
RT called to room to place a nasopharyngeal airway per Md Kirkpatrick.  RT placed a 6.0 mm nasopharyngeal airway in right nare.

## 2021-01-20 NOTE — Plan of Care (Signed)
Daughter requested to talk to me again. She has made decision after discussing with her family that they want  comfort care given the nature of the condition is serious and prognosis is poor. Pt has expressed to family in the past that she would not want breathing tube or nursing home placement. Daughter stated that pt has had wonderful and they do not want her to suffer. I will put in comfort care measure orders. Dr. Merrily Pew CCM will remove central line. Will transfer to 6N after initiation of comfort care.   Marvel Plan, MD PhD Stroke Neurology 01/20/2021 11:14 AM

## 2021-01-20 NOTE — Progress Notes (Signed)
STROKE TEAM PROGRESS NOTE   SUBJECTIVE (INTERVAL HISTORY) Her daughter is at the bedside. Pt had decline since TPA yesterday. She started to have GIB due to diverticulosis. Per GI, pt had significant GIB 3 years ago due to diverticulosis. Currently, no GI intervention can be offered. Pt still has active bleeding at this time, has received 4U PRBC, hemoglobin now 11.5. pt continued to have right gaze, left neglect, hemianopia and left side hemiplegia. Overnight, mental status decline, nonverbal, and not following commands. Sonorus breathing on nasal trumpet. CCM on board. I had long discussion with daughter at the bedside, regarding continued GIB and poor prognosis of the stroke. She expressed that pt would not want nursing home or intubation. She has had wonderful life and she was considering comfort care but she needs to talk to other family to decide. She will let us know.      OBJECTIVE Temp:  [96.8 F (36 C)-98.8 F (37.1 C)] 97.7 F (36.5 C) (06/27 0800) Pulse Rate:  [43-114] 79 (06/27 0800) Cardiac Rhythm: Normal sinus rhythm (06/27 0800) Resp:  [10-27] 19 (06/27 0800) BP: (65-188)/(49-146) 180/64 (06/27 0800) SpO2:  [56 %-100 %] 100 % (06/27 0800)  Recent Labs  Lab 01/17/2021 0840 01/12/2021 1611 01/10/2021 1935  GLUCAP 85 107* 115*   Recent Labs  Lab 01/21/2021 0839 01/09/2021 0848 01/20/21 0207 01/20/21 0230  NA 140 142 139 143  K 4.0 3.9 3.8 4.0  CL 109 108 110  --   CO2 22  --  18*  --   GLUCOSE 97 97 141*  --   BUN 21 21 31*  --   CREATININE 0.90 0.80 1.24*  --   CALCIUM 9.7  --  7.4*  --   MG  --   --  1.6*  --   PHOS  --   --  5.4*  --    Recent Labs  Lab 01/03/2021 0839  AST 18  ALT 12  ALKPHOS 57  BILITOT 0.4  PROT 7.0  ALBUMIN 3.4*   Recent Labs  Lab 12/28/2020 0839 01/06/2021 0848 01/16/2021 1644 01/07/2021 2308 01/20/21 0230 01/20/21 0526  WBC 7.8  --   --   --   --   --   NEUTROABS 5.8  --   --   --   --   --   HGB 13.3 13.6 11.0* 11.5* 11.6* 11.5*  HCT  41.3 40.0 33.6* 36.6 34.0* 35.2*  MCV 90.2  --   --   --   --   --   PLT 384  --   --   --   --   --    No results for input(s): CKTOTAL, CKMB, CKMBINDEX, TROPONINI in the last 168 hours. Recent Labs    01/13/2021 0839  LABPROT 13.4  INR 1.0   No results for input(s): COLORURINE, LABSPEC, PHURINE, GLUCOSEU, HGBUR, BILIRUBINUR, KETONESUR, PROTEINUR, UROBILINOGEN, NITRITE, LEUKOCYTESUR in the last 72 hours.  Invalid input(s): APPERANCEUR     Component Value Date/Time   CHOL 129 01/20/2021 0521   TRIG 103 01/20/2021 0521   HDL 22 (L) 01/20/2021 0521   CHOLHDL 5.9 01/20/2021 0521   VLDL 21 01/20/2021 0521   LDLCALC 86 01/20/2021 0521   Lab Results  Component Value Date   HGBA1C 5.4 07/31/2011   No results found for: LABOPIA, COCAINSCRNUR, LABBENZ, AMPHETMU, THCU, LABBARB  No results for input(s): ETH in the last 168 hours.  I have personally reviewed the radiological images below and agree with  the radiology interpretations.  CT HEAD WO CONTRAST  Result Date: 01/20/2021 CLINICAL DATA:  Follow-up post tPA.  Stroke. EXAM: CT HEAD WITHOUT CONTRAST TECHNIQUE: Contiguous axial images were obtained from the base of the skull through the vertex without intravenous contrast. COMPARISON:  01/06/2021 FINDINGS: Brain: Again noted is the acute infarct in the lenticulostriate region on the right. No real change since prior study. No hemorrhage. No new areas of acute infarct or hydrocephalus. Vascular: No hyperdense vessel or unexpected calcification. Skull: No acute calvarial abnormality. Sinuses/Orbits: No acute findings Other: None IMPRESSION: Stable acute right lenticulostriate infarct. No evidence of hemorrhage. Electronically Signed   By: Charlett NoseKevin  Dover M.D.   On: 01/20/2021 02:06   CT HEAD WO CONTRAST  Result Date: 01/01/2021 CLINICAL DATA:  Cerebral hemorrhage suspected.  Status post tPA. EXAM: CT HEAD WITHOUT CONTRAST TECHNIQUE: Contiguous axial images were obtained from the base of the  skull through the vertex without intravenous contrast. COMPARISON:  January 19, 2021 CT head. FINDINGS: Brain: Mild right lenticulostriate hypoattenuation, compatible with infarct that was better seen on recent MRI. No evidence of acute hemorrhage. Similar atrophy and ex vacuo ventricular dilation. No hydrocephalus. No midline shift. Basal cisterns are patent. Vascular: No hyperdense vessel identified. Calcific atherosclerosis. Skull: No acute fracture. Sinuses/Orbits: Visualized sinuses are clear. No acute orbital findings. Other: No mastoid effusions. IMPRESSION: Evolving acute right lenticulostriate infarct without evidence of acute hemorrhage. No mass effect. Electronically Signed   By: Feliberto HartsFrederick S Jones MD   On: 01/15/2021 18:03   CT HEAD WO CONTRAST  Result Date: 12/25/2020 CLINICAL DATA:  Stroke, post tPA administration, worsening neurologic defects. EXAM: CT HEAD WITHOUT CONTRAST TECHNIQUE: Contiguous axial images were obtained from the base of the skull through the vertex without intravenous contrast. COMPARISON:  MRI 01/09/2021, CT 01/07/2021 FINDINGS: Brain: Slightly increase conspicuity of hypoattenuation in the right lenticulostriate distribution compatible with region of restricted diffusion on comparison MR imaging and corresponding to the site of patient's infarct. No significant interval expansion is discernible on CT imaging when compared to MR. No hyperdense hemorrhage. No extra-axial collection. No significant mass effect or midline shift. Background of diffuse parenchymal volume loss with ex vacuo dilatation of the ventricles. Findings are on a background of more diffuse patchy regions of white matter hypoattenuation typically associated with microvascular angiopathy in a patient of this age. Scattered benign dural calcifications. Cerebellar tonsils are normally position. Stable appearance of the midline intracranial structures. Vascular: Aortic Atherosclerosis (ICD10-I70.0). Skull: No  calvarial fracture or suspicious osseous lesion. No scalp swelling or hematoma. Sinuses/Orbits: Paranasal sinuses and mastoid air cells are predominantly clear. Middle ear cavities are clear. Orbital structures are unremarkable aside from prior lens extractions. Other: None. IMPRESSION: Hypoattenuation the right lenticulostriate distribution compatible with known infarct. No significant interval expansion or hyperdense hemorrhage is identified. Background of parenchymal volume loss, microvascular angiopathy and intracranial atherosclerosis. Electronically Signed   By: Kreg ShropshirePrice  DeHay M.D.   On: 01/21/2021 15:23   MR BRAIN WO CONTRAST  Result Date: 12/27/2020 CLINICAL DATA:  Wake up stroke EXAM: MRI HEAD WITHOUT CONTRAST TECHNIQUE: Multiplanar, multiecho pulse sequences of the brain and surrounding structures were obtained without intravenous contrast. COMPARISON:  Head CT from earlier today and brain MRI from 2 days ago FINDINGS: Brain: Wedge of restricted diffusion in the right corona radiata and posterior putamen without any associated FLAIR signal alteration. Generalized atrophy. No hemorrhage, hydrocephalus, or collection. Vascular: Not assessed due to limited protocol techniques. Skull and upper cervical spine: Not primarily assessed Sinuses/Orbits:  Negative These results were called by telephone at the time of interpretation on Jan 31, 2021 at 9:16 am to provider [facility] , who verbally acknowledged these results. IMPRESSION: Restricted diffusion in a right lateral lenticulostriate distribution without FLAIR changes. Electronically Signed   By: Marnee Spring M.D.   On: 31-Jan-2021 09:16   DG CHEST PORT 1 VIEW  Result Date: 01-31-21 CLINICAL DATA:  Central line placement EXAM: PORTABLE CHEST 1 VIEW COMPARISON:  December 28, 2013 FINDINGS: A left central line is been placed in the interval. The distal tip is flipped back on itself in the region of the proximal SVC. Recommend repositioning. No  pneumothorax. No other changes. IMPRESSION: 1. The distal tip of the new left central line is flipped back on itself, projected over the SVC. No pneumothorax. Recommend repositioning of the line. 2. No other changes. These results will be called to the ordering clinician or representative by the Radiologist Assistant, and communication documented in the PACS or [employer]. Electronically Signed   By: Gerome Sam III M.D   On: 01-31-21 18:53   ECHOCARDIOGRAM COMPLETE  Result Date: Jan 31, 2021    ECHOCARDIOGRAM REPORT   Patient Name:   ELKA SATTERFIELD Date of Exam: 2021-01-31 Medical Rec #:  [id number]          Height:       60.0 in Accession #:    [id number]         Weight:       145.5 lb Date of Birth:  07-Aug-1925          BSA:          1.631 m Patient Age:    89 years           BP:           136/97 mmHg Patient Gender: F                  HR:           43 bpm. Exam Location:  Inpatient Procedure: 2D Echo, Cardiac Doppler and Color Doppler Indications:    Stroke  History:        Patient has no prior history of Echocardiogram examinations.                 Risk Factors:Hypertension, Dyslipidemia and Sleep Apnea.  Sonographer:    Ross Ludwig RDCS (AE) Referring Phys: [id number] [facility]  Sonographer Comments: Image acquisition challenging due to respiratory motion. Patient movement. IMPRESSIONS  1. Left ventricular ejection fraction, by estimation, is 65 to 70%. The left ventricle has normal function. The left ventricle has no regional wall motion abnormalities. There is moderate asymmetric left ventricular hypertrophy of the septal segment. Left ventricular diastolic parameters are consistent with Grade I diastolic dysfunction (impaired relaxation).  2. Right ventricular systolic function is normal. The right ventricular size is normal. Tricuspid regurgitation signal is inadequate for assessing PA pressure.  3. The mitral valve is normal in structure. No evidence of mitral valve regurgitation.  4.  The aortic valve was not well visualized. Aortic valve regurgitation is mild. No aortic stenosis is present. FINDINGS  Left Ventricle: Left ventricular ejection fraction, by estimation, is 65 to 70%. The left ventricle has normal function. The left ventricle has no regional wall motion abnormalities. The left ventricular internal cavity size was small. There is moderate  asymmetric left ventricular hypertrophy of the septal segment. Left ventricular diastolic parameters are consistent with Grade I diastolic dysfunction (impaired relaxation). Right Ventricle:  The right ventricular size is normal. No increase in right ventricular wall thickness. Right ventricular systolic function is normal. Tricuspid regurgitation signal is inadequate for assessing PA pressure. Left Atrium: Left atrial size was normal in size. Right Atrium: Right atrial size was not well visualized. Pericardium: There is no evidence of pericardial effusion. Presence of pericardial fat pad. Mitral Valve: The mitral valve is normal in structure. No evidence of mitral valve regurgitation. Tricuspid Valve: The tricuspid valve is normal in structure. Tricuspid valve regurgitation is trivial. Aortic Valve: The aortic valve was not well visualized. Aortic valve regurgitation is mild. Aortic regurgitation PHT measures 538 msec. No aortic stenosis is present. Aortic valve mean gradient measures 4.0 mmHg. Aortic valve peak gradient measures 9.1 mmHg. Aortic valve area, by VTI measures 2.67 cm. Pulmonic Valve: The pulmonic valve was not well visualized. Pulmonic valve regurgitation is not visualized. Aorta: The aortic root is normal in size and structure. IAS/Shunts: The interatrial septum was not well visualized.  LEFT VENTRICLE PLAX 2D LVIDd:         2.90 cm  Diastology LVIDs:         2.10 cm  LV e' medial:    4.79 cm/s LV PW:         1.40 cm  LV E/e' medial:  12.3 LV IVS:        1.40 cm  LV e' lateral:   9.90 cm/s LVOT diam:     2.10 cm  LV E/e' lateral:  5.9 LV SV:         57 LV SV Index:   35 LVOT Area:     3.46 cm  IVC IVC diam: 1.00 cm LEFT ATRIUM             Index LA diam:        2.70 cm 1.66 cm/m LA Vol (A2C):   28.9 ml 17.72 ml/m LA Vol (A4C):   34.6 ml 21.22 ml/m LA Biplane Vol: 31.9 ml 19.56 ml/m  AORTIC VALVE AV Area (Vmax):    2.11 cm AV Area (Vmean):   2.51 cm AV Area (VTI):     2.67 cm AV Vmax:           151.00 cm/s AV Vmean:          88.700 cm/s AV VTI:            0.214 m AV Peak Grad:      9.1 mmHg AV Mean Grad:      4.0 mmHg LVOT Vmax:         91.80 cm/s LVOT Vmean:        64.400 cm/s LVOT VTI:          0.165 m LVOT/AV VTI ratio: 0.77 AI PHT:            538 msec  AORTA Ao Root diam: 3.60 cm MITRAL VALVE MV Area (PHT): 2.42 cm     SHUNTS MV Decel Time: 314 msec     Systemic VTI:  0.16 m MV E velocity: 58.70 cm/s   Systemic Diam: 2.10 cm MV A velocity: 122.00 cm/s MV E/A ratio:  0.48 Epifanio Lesches MD Electronically signed by Epifanio Lesches MD Signature Date/Time: 18-Feb-2021/3:20:13 PM    Final    CT HEAD CODE STROKE WO CONTRAST  Result Date: 02-18-21 CLINICAL DATA:  Code stroke. EXAM: CT HEAD WITHOUT CONTRAST TECHNIQUE: Contiguous axial images were obtained from the base of the skull through the vertex without intravenous contrast. COMPARISON:  Brain  MRI 01/18/2020 FINDINGS: Brain: No evidence of acute infarction, hemorrhage, hydrocephalus, extra-axial collection or mass lesion/mass effect. Generalized atrophy which is prominent. Vascular: No hyperdense vessel or unexpected calcification. Skull: Normal. Negative for fracture or focal lesion. Sinuses/Orbits: No acute finding. Other: These results were communicated to Dr Sal at 8:57 amon 07-11-2022by text page via the [facility] messaging system. ASPECTS [facility] Stroke Program Early CT Score) Not scored without localizing history IMPRESSION: 1. No acute finding. 2. Generalized atrophy. Electronically Signed   By: Marnee Spring M.D.   On: February 03, 2021 08:58   CT Angio Abd/Pel w/  and/or w/o  Result Date: 01/20/2021 CLINICAL DATA:  Gastrointestinal hemorrhage EXAM: CTA ABDOMEN AND PELVIS WITHOUT AND WITH CONTRAST TECHNIQUE: Multidetector CT imaging of the abdomen and pelvis was performed using the standard protocol during bolus administration of intravenous contrast. Multiplanar reconstructed images and MIPs were obtained and reviewed to evaluate the vascular anatomy. CONTRAST:  OMNIPAQUE IOHEXOL 350 MG/ML SOLN COMPARISON:  None. FINDINGS: VASCULAR Aorta: Moderate mixed atherosclerotic plaque. No evidence of hemodynamically significant stenosis. No aneurysm or dissection. No periaortic inflammatory change. Celiac: Widely patent. Normal anatomic configuration. No dissection or aneurysm. SMA: Less than 50% stenosis at the origin. Distally widely patent. No aneurysm or dissection. Renals: Single renal arteries bilaterally are widely patent and demonstrate normal vascular morphology. No aneurysm. IMA: Patent. Inflow: The lower extremity arterial inflow is widely patent on the right. A focal less than 50% stenosis is seen of the left external iliac artery in its mid segment. The right internal iliac artery is patent at its origin. The left internal iliac artery demonstrates near occlusion at its origin, but is patent. 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 Distally, fusiform aneurysm of the internal iliac arteries seen to the level of its anterior and posterior division. Proximal Outflow: Less than 50% stenosis of the left SFA at its origin. Otherwise widely patent. Veins: Unremarkable. Review of the MIP images confirms the above findings. NON-VASCULAR Lower chest: Mild bibasilar fibrotic change. Moderate coronary artery calcification. Global cardiac size within normal limits. Small hiatal hernia. Hepatobiliary: Surgical changes of probable partial right hepatectomy and cholecystectomy are identified. There is pneumobilia  within the liver noted in keeping with prior sphincterotomy. Mild intrahepatic and moderate extrahepatic biliary ductal dilation may represent post cholecystectomy change. No focal intrahepatic masses identified. Pancreas: Unremarkable Spleen: Unremarkable Adrenals/Urinary Tract: The adrenal glands are unremarkable. The kidneys are normal in size and position. Exophytic 5.1 cm simple cortical cyst arises from the interpolar region of the left kidney. Additional tiny cortical cysts are seen bilaterally. The kidneys are otherwise unremarkable. The bladder is unremarkable. Stomach/Bowel: Evaluation of bowel is slightly limited by motion artifact. There is severe descending and sigmoid colonic diverticulosis. Scattered diverticulosis is seen throughout the proximal colon. There is eccentric mural thickening involving the proximal sigmoid colon which may simply represent muscular hypertrophy in the setting of severe sigmoid diverticulosis, however, and eccentric mural mass with appear similarly. The stomach, small bowel, and large bowel are otherwise unremarkable. Appendix normal. No free intraperitoneal gas or fluid. Lymphatic: No pathologic adenopathy within the abdomen and pelvis. Reproductive: Status post hysterectomy. No adnexal masses. Other: No abdominal wall hernia. Musculoskeletal: Degenerative changes are seen within the lumbar spine. No acute bone abnormality. No lytic or blastic bone lesion. 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 NON-VASCULAR No evidence of active extravasation to account for the patient's reported gastrointestinal hemorrhage. Severe descending and sigmoid colonic diverticulosis. Eccentric mural thickening within the proximal sigmoid colon may simply represent the sequela of muscular hypertrophy, however, correlation with recent endoscopy is recommended to exclude  the presence of an underlying mural mass. Aortic Atherosclerosis (ICD10-I70.0). Electronically Signed   By: Helyn Numbers MD   On: 01/20/2021 02:25     PHYSICAL EXAM  Temp:  [96.8 F (36 C)-98.8 F (37.1 C)] 97.7 F (36.5 C) (06/27 0800) Pulse Rate:  [43-114] 79 (06/27 0800) Resp:  [10-27] 19 (06/27 0800) BP: (65-188)/(49-146) 180/64 (06/27 0800) SpO2:  [56 %-100 %] 100 % (06/27 0800)  General - Well nourished, well developed, in mild respiratory distress on nasal trumpet  Ophthalmologic - fundi not visualized due to noncooperation.  Cardiovascular - Regular rhythm and rate.  Neuro - awake, eyes open, nonverbal, not answer questions, not follow commands. Right forced gaze, not blinking to visual threat on the left but intermittently blinking on the right. PERRL. No significant facial droop with right nasal trumpet. Tongue protrusion not cooperative. LUE flaccid. RUE able to have bicep against gravity without drift. BLEs no movement on pain stimulation. Sensation, coordination and gait not tested.   ASSESSMENT/PLAN Ms. Kimberly Rasmussen is a 87 y.o. female with history of HTN, HLD, GIB admitted for left arm weakness, not able to walk and slurry speech. TPA was given  Stroke:  right BG/CR infarct, likely small vessel disease  CT head no acute abnormality MRI showed right BG/CR infarct on DWI but negative on FLAIR T2 MRA  pending Carotid Doppler  pending  2D Echo EF 65-70% LDL 86 HgbA1c pending SCDs for VTE prophylaxis No antithrombotic prior to admission, now on No antithrombotic within 24h of tPA Ongoing aggressive stroke risk factor management for now Therapy recommendations:  pending  Disposition:  pending - I had long discussion with daughter at the bedside, regarding continued GIB and poor prognosis of the stroke. She expressed that pt would not want nursing home or intubation. She has had wonderful life and she was considering comfort care but she needs to talk to other  family to decide. She will let us know.     LGIB Pt had LGIB 3 years ago due to diverticulosis Active LGIB post tPA Received 4U PRBC S/p TXA reversal Fibrinogen 200 GI on board, no intervention recommended Hb 13.6->11.0->11.5 Vital stable post fluid resus Daughter understood the active bleeding and considering comfort care measures. She will talk to other family to decide.   Hypertension Stable BP goal < 180/105 post tPA Long term BP goal normotensive  Hyperlipidemia Home meds:  none  LDL 87, goal < 70 Will consider statin at discharge  Other Stroke Risk Factors Advanced age Former smoker, quit 25 years ago  Other Active Problems AKI Cre 0.80->1.24  Hospital day # 1  This patient is critically ill due to stroke s/p tPA, LGIB, anemia, AKI and at significant risk of neurological worsening, death form shock, severe anemia, recurrent stroke, hemorrhagic conversion, bleeding from tPA. This patient's care requires constant monitoring of vital signs, hemodynamics, respiratory and cardiac monitoring, review of multiple databases, neurological assessment, discussion with family, other specialists and medical decision making of high complexity. I spent 40 minutes of neurocritical care time in the care of this patient. I had long discussion with daughter at the bedside, regarding continued GIB and poor prognosis of the stroke. She expressed that pt would  not want nursing home or intubation. She has had wonderful life and she was considering comfort care but she needs to talk to other family to decide. She will let us know.     Marvel Plan, MD PhD Stroke Neurology 01/20/2021 10:31 AM    To contact Stroke Continuity provider, please refer to [email or web address]. After hours, contact General Neurology

## 2021-01-20 NOTE — Progress Notes (Signed)
Carotid duplex bilateral study completed.   Please see CV Proc for preliminary results.   Tayna Smethurst, RDMS, RVT  

## 2021-01-20 NOTE — Consult Note (Signed)
NAME:  Kimberly Rasmussen, MRN:  [id number], DOB:  September 21, 1925, LOS: 1 ADMISSION DATE:  01/17/2021, CONSULTATION DATE: 6/27 REFERRING MD: Dr. Amada Jupiter, CHIEF COMPLAINT: GI bleed  History of Present Illness:  Patient is encephalopathic and/or intubated. Therefore history has been obtained from chart review.  - 87 year old female with past medical history as below, which is significant for diverticulitis, hypertension, and dyslipidemia.  She was last known well 6/25 at approximately 8:30 PM and when she awoke at 6:30 AM on 6/26 she was unable to move her legs.  About an hour later with family present she began to develop slurred speech and became weak in the left side.  EMS was called and she was transported to [facility] as a code stroke.  MRI the brain demonstrated restricted diffusion in the right lenticulostriate distribution.  She received tPA in emergency department and was admitted to the neuro ICU.  Around 4 PM she had an episode of bright red blood per rectum.  No changes were made at that time as well as an isolated event and she maintained hemodynamic stable ability.  She continues to have ongoing bleeding which culminated in hemodynamic instability at approximately 11:00 PM.  Neurology appropriately transfused packed red cells, discussed the case with gastroenterology, and reversed tPA with TXA.  PCCM was consulted for hemorrhagic shock.  Pertinent  Medical History   has a past medical history of AKI (acute kidney injury) (HCC) (09/19/2018), Arthritis, Diverticulitis, Diverticulosis, Dyslipidemia, Dysrhythmia, HTN (hypertension), Jaundice, obstructive, intrahepatic, PONV (postoperative nausea and vomiting), Shortness of breath, and UTI (urinary tract infection).   Significant Hospital Events: Including procedures, antibiotic start and stop dates in addition to other pertinent events   6/26 admit for CVA, given tPA. GI bleed, with hemorrhagic shock.  tPA reversed. PRBC transfused.    Interim History / Subjective:    Objective   Blood pressure (!) 104/58, pulse 81, temperature 97.8 F (36.6 C), resp. rate 19, weight 66 kg, SpO2 99 %.        Intake/Output Summary (Last 24 hours) at 01/20/2021 0124 Last data filed at 01/20/2021 0048 Gross per 24 hour  Intake 1993.2 ml  Output --  Net 1993.2 ml   Filed Weights   12/28/2020 0924  Weight: 66 kg    Examination: General: elderly female in bed HENT: [place]/AT, PERRL, rightward gaze deviation.  Lungs: Labored breathing. Clear bilateral breath sounds Cardiovascular: RRR, no MRG Abdomen: Soft, non-distended.  Extremities: No acute deformity. No edema.  Neuro: Eyes open with rightward gaze deviation. No response. Does not follow commands.   Labs/imaging that I have personally reviewed  (right click and "Reselect all SmartList Selections" daily)  CT head initial > non-acute  MRI brain > Restricted diffusion in a right lateral lenticulostriate distribution without FLAIR changes. CT head > evolving R lenticulostriate infarct. No hemorrhage observed.   Echo: LVEF 65-70%, grade 1 DD.   Resolved Hospital Problem list     Assessment & Plan:   Hemorrhagic shock: became hypotensive after multiple bloody bowel movements resulting in AMS. Emergently transfused 2 units PRBC and TXA given to reverse TPA.  Gastrointestinal hemorrhage - Serial CBC - s/p 3 units PRBC, will transfuse platelets and FFP as well 1:1:1 - Lactic acid pending.  - GI involved by neurology - CTA abdomen/pelvis pending - No role for endoscopic intervention at this time.   CVA: ight lateral lenticulostriate distribution - management per neurology  Acute encephalopathy: following commands prior to hemorrhagic event. Now BP improved after transfusion,  but she remains unresponsive.  R gaze preference. Causing marginal airway protection. Patient has endorsed DNI.  - CT head pending - Repeat chemistry - Ongoing neuro-checks - not a candidate for BiPAP,  DNI, ABG not helpful.   HTN HLD - Holding home antihypertensives - SBP goal < 180 mmHg  Best Practice (right click and "Reselect all SmartList Selections" daily)   Diet/type: NPO Pain/Anxiety/Delirium protocol Not indicated VAP protocol (if indicated): Not indicated DVT prophylaxis: not indicated GI prophylaxis: PPI Glucose control:  SSI Central venous access:  Yes, and it is still needed Arterial line:  N/A Foley:  N/A Mobility:  bed rest  PT consulted: Yes Studies pending: None Culture data pending:none  Last reviewed culture data:today Antibiotics:not indicated.  Antibiotic de-escalation: N/A Stop date: N/A Code Status:  limited Last date of multidisciplinary goals of care discussion [ ]  ccm prognosis: Life-threating and Serious Disposition: remains critically ill, will stay in intensive care   Labs   CBC: Recent Labs  Lab 01-31-21 0839 01/31/2021 0848 01/31/2021 1644 01/31/2021 2308  WBC 7.8  --   --   --   NEUTROABS 5.8  --   --   --   HGB 13.3 13.6 11.0* 11.5*  HCT 41.3 40.0 33.6* 36.6  MCV 90.2  --   --   --   PLT 384  --   --   --     Basic Metabolic Panel: Recent Labs  Lab 01-31-21 0839 January 31, 2021 0848  NA 140 142  K 4.0 3.9  CL 109 108  CO2 22  --   GLUCOSE 97 97  BUN 21 21  CREATININE 0.90 0.80  CALCIUM 9.7  --    GFR: CrCl cannot be calculated (Unknown ideal weight.). Recent Labs  Lab 01/31/21 0839  WBC 7.8    Liver Function Tests: Recent Labs  Lab 2021-01-31 0839  AST 18  ALT 12  ALKPHOS 57  BILITOT 0.4  PROT 7.0  ALBUMIN 3.4*   No results for input(s): LIPASE, AMYLASE in the last 168 hours. No results for input(s): AMMONIA in the last 168 hours.  ABG    Component Value Date/Time   TCO2 24 01-31-21 0848     Coagulation Profile: Recent Labs  Lab 01-31-2021 0839  INR 1.0    Cardiac Enzymes: No results for input(s): CKTOTAL, CKMB, CKMBINDEX, TROPONINI in the last 168 hours.  HbA1C: Hgb A1c MFr Bld  Date/Time Value  Ref Range Status  07/31/2011 05:40 AM 5.4 <5.7 % Final    Comment:    (NOTE)                                                                       According to the ADA Clinical Practice Recommendations for 2011, when HbA1c is used as a screening test:  >=6.5%   Diagnostic of Diabetes Mellitus           (if abnormal result is confirmed) 5.7-6.4%   Increased risk of developing Diabetes Mellitus References:Diagnosis and Classification of Diabetes Mellitus,Diabetes Care,2011,34(Suppl 1):S62-S69 and Standards of Medical Care in         Diabetes - 2011,Diabetes Care,2011,34 (Suppl 1):S11-S61.    CBG: Recent Labs  Lab 01/31/2021 0840 January 31, 2021 1611 2021/01/31 1935  GLUCAP  85 107* 115*    Review of Systems:   Patient is encephalopathic and/or intubated. Therefore history has been obtained from chart review.    Past Medical History:  She,  has a past medical history of AKI (acute kidney injury) (HCC) (09/19/2018), Arthritis, Diverticulitis, Diverticulosis, Dyslipidemia, Dysrhythmia, HTN (hypertension), Jaundice, obstructive, intrahepatic, PONV (postoperative nausea and vomiting), Shortness of breath, and UTI (urinary tract infection).   Surgical History:   Past Surgical History:  Procedure Laterality Date   CHOLECYSTECTOMY     removal of gallstones x4 (?ERCP)   ERCP     with lithotripsy and gall stone removal 2012   ESOPHAGOGASTRODUODENOSCOPY N/A 04/11/2015   Procedure: ESOPHAGOGASTRODUODENOSCOPY (EGD);  Surgeon: Jeani Hawking, MD;  [facility];  Service: Endoscopy;  Laterality: N/A;   FOOT SURGERY     x2   KNEE SURGERY     LEFT KNEE   TOTAL ABDOMINAL HYSTERECTOMY     TOTAL KNEE ARTHROPLASTY     RIGHT KNEE   TOTAL KNEE ARTHROPLASTY  07/13/2011   Procedure: TOTAL KNEE ARTHROPLASTY;  Surgeon: Nestor Lewandowsky;  Location: [facility] OR;  Service: Orthopedics;  Laterality: Left;  Left Total Knee Arthroplasty with Hardware Removal      Social History:   reports that she quit smoking  about 24 years ago. Her smoking use included cigarettes. She has a 20.00 pack-year smoking history. She has never used smokeless tobacco. She reports current alcohol use of about 1.0 standard drink of alcohol per week. She reports that she does not use drugs.   Family History:  Her family history includes Hypertension in her maternal grandmother; Lung cancer in her sister; Pneumonia in her mother; Stroke in her maternal grandmother.   Allergies Allergies  Allergen Reactions   Amitriptyline Other (See Comments)    Hallucinations    Gabapentin Other (See Comments)    Hallucinations    Meloxicam Other (See Comments)    Hallucinations    Tramadol Other (See Comments)    Hallucinations      Home Medications  Prior to Admission medications   Medication Sig Start Date End Date Taking? Authorizing Provider  acetaminophen (TYLENOL) 325 MG tablet Take 2 tablets (650 mg total) by mouth every 6 (six) hours as needed for mild pain, fever or headache. 09/21/18  Yes Ghimire, Werner Lean, MD  calcium-vitamin D (OSCAL WITH D) 500-200 MG-UNIT per tablet Take 1 tablet by mouth 2 (two) times daily.   Yes [provider]  Cholecalciferol (VITAMIN D3) 25 MCG (1000 UT) CAPS Take 1,000 Units by mouth daily. 02/14/19  Yes [provider]  ferrous sulfate 325 (65 FE) MG tablet Take 325 mg by mouth daily. 04/23/15  Yes [provider]  HYDROcodone-acetaminophen (NORCO/VICODIN) 5-325 MG per tablet Take 1 tablet by mouth daily as needed for moderate pain.   Yes [provider]  Multiple Vitamins-Minerals (MULTIVITAMIN PO) Take 1 tablet by mouth daily.   Yes [provider]  oxymetazoline (AFRIN) 0.05 % nasal spray Place 1 spray into both nostrils daily as needed for congestion.   Yes [provider]  Propylene Glycol (SYSTANE BALANCE) 0.6 % SOLN Place 1 drop into both eyes daily as needed (dry eyes).   Yes [provider]     Critical care time: 46  minutes.      Joneen Roach, AGACNP-BC [facility]  See Amion for personal pager PCCM on call pager [phone] until 7pm. Please call Elink 7p-7a. [phone]  01/20/2021 1:58 AM

## 2021-01-21 LAB — BPAM FFP
Blood Product Expiration Date: 202206302359
Blood Product Expiration Date: 202206302359
ISSUE DATE / TIME: 202206270314
ISSUE DATE / TIME: 202206270314
Unit Type and Rh: 6200
Unit Type and Rh: 6200

## 2021-01-21 LAB — TYPE AND SCREEN
ABO/RH(D): O POS
Antibody Screen: NEGATIVE
Unit division: 0
Unit division: 0
Unit division: 0
Unit division: 0

## 2021-01-21 LAB — PREPARE FRESH FROZEN PLASMA
Unit division: 0
Unit division: 0

## 2021-01-21 LAB — BPAM RBC
Blood Product Expiration Date: 202207302359
Blood Product Expiration Date: 202207302359
Blood Product Expiration Date: 202207312359
Blood Product Expiration Date: 202207312359
ISSUE DATE / TIME: 202206261700
ISSUE DATE / TIME: 202206261920
ISSUE DATE / TIME: 202206270044
ISSUE DATE / TIME: 202206270044
Unit Type and Rh: 5100
Unit Type and Rh: 5100
Unit Type and Rh: 5100
Unit Type and Rh: 5100

## 2021-01-21 LAB — HEMOGLOBIN A1C
Hgb A1c MFr Bld: 5.3 % (ref 4.8–5.6)
Mean Plasma Glucose: 105 mg/dL

## 2021-01-21 NOTE — Progress Notes (Signed)
STROKE TEAM PROGRESS NOTE   SUBJECTIVE (INTERVAL HISTORY) Her daughter is at the bedside. Pt lying in bed, unresponsive but not in distress, appears to be comfortable. On morphine drip, in full comfort care. Likely hospital death will not seek for transfer to residential hospice at this time. Daughter stated that the LGIB seems getting better.   OBJECTIVE Pulse Rate:  [60-75] 67 (06/28 0600) Cardiac Rhythm: Normal sinus rhythm (06/27 2000) Resp:  [10-35] 11 (06/28 0600) BP: (148-166)/(54-65) 166/54 (06/27 1100) SpO2:  [84 %-97 %] 93 % (06/28 0600)  Recent Labs  Lab 02-04-21 0840 2021-02-04 1611 2021/02/04 1935  GLUCAP 85 107* 115*   Recent Labs  Lab 04-Feb-2021 0839 2021-02-04 0848 01/20/21 0207 01/20/21 0230  NA 140 142 139 143  K 4.0 3.9 3.8 4.0  CL 109 108 110  --   CO2 22  --  18*  --   GLUCOSE 97 97 141*  --   BUN 21 21 31*  --   CREATININE 0.90 0.80 1.24*  --   CALCIUM 9.7  --  7.4*  --   MG  --   --  1.6*  --   PHOS  --   --  5.4*  --    Recent Labs  Lab 2021/02/04 0839  AST 18  ALT 12  ALKPHOS 57  BILITOT 0.4  PROT 7.0  ALBUMIN 3.4*   Recent Labs  Lab 2021/02/04 0839 02/04/21 0848 02/04/21 1644 2021/02/04 2308 01/20/21 0230 01/20/21 0526  WBC 7.8  --   --   --   --   --   NEUTROABS 5.8  --   --   --   --   --   HGB 13.3 13.6 11.0* 11.5* 11.6* 11.5*  HCT 41.3 40.0 33.6* 36.6 34.0* 35.2*  MCV 90.2  --   --   --   --   --   PLT 384  --   --   --   --   --    No results for input(s): CKTOTAL, CKMB, CKMBINDEX, TROPONINI in the last 168 hours. Recent Labs    02/04/21 0839  LABPROT 13.4  INR 1.0   No results for input(s): COLORURINE, LABSPEC, PHURINE, GLUCOSEU, HGBUR, BILIRUBINUR, KETONESUR, PROTEINUR, UROBILINOGEN, NITRITE, LEUKOCYTESUR in the last 72 hours.  Invalid input(s): APPERANCEUR     Component Value Date/Time   CHOL 129 01/20/2021 0521   TRIG 103 01/20/2021 0521   HDL 22 (L) 01/20/2021 0521   CHOLHDL 5.9 01/20/2021 0521   VLDL 21 01/20/2021  0521   LDLCALC 86 01/20/2021 0521   Lab Results  Component Value Date   HGBA1C 5.3 01/20/2021   No results found for: LABOPIA, COCAINSCRNUR, LABBENZ, AMPHETMU, THCU, LABBARB  No results for input(s): ETH in the last 168 hours.  I have personally reviewed the radiological images below and agree with the radiology interpretations.  CT HEAD WO CONTRAST  Result Date: 01/20/2021 CLINICAL DATA:  Follow-up post tPA.  Stroke. EXAM: CT HEAD WITHOUT CONTRAST TECHNIQUE: Contiguous axial images were obtained from the base of the skull through the vertex without intravenous contrast. COMPARISON:  02-04-21 FINDINGS: Brain: Again noted is the acute infarct in the lenticulostriate region on the right. No real change since prior study. No hemorrhage. No new areas of acute infarct or hydrocephalus. Vascular: No hyperdense vessel or unexpected calcification. Skull: No acute calvarial abnormality. Sinuses/Orbits: No acute findings Other: None IMPRESSION: Stable acute right lenticulostriate infarct. No evidence of hemorrhage. Electronically Signed  By: Charlett Nose M.D.   On: 01/20/2021 02:06   CT HEAD WO CONTRAST  Result Date: 02/01/2021 CLINICAL DATA:  Cerebral hemorrhage suspected.  Status post tPA. EXAM: CT HEAD WITHOUT CONTRAST TECHNIQUE: Contiguous axial images were obtained from the base of the skull through the vertex without intravenous contrast. COMPARISON:  02/01/21 CT head. FINDINGS: Brain: Mild right lenticulostriate hypoattenuation, compatible with infarct that was better seen on recent MRI. No evidence of acute hemorrhage. Similar atrophy and ex vacuo ventricular dilation. No hydrocephalus. No midline shift. Basal cisterns are patent. Vascular: No hyperdense vessel identified. Calcific atherosclerosis. Skull: No acute fracture. Sinuses/Orbits: Visualized sinuses are clear. No acute orbital findings. Other: No mastoid effusions. IMPRESSION: Evolving acute right lenticulostriate infarct without  evidence of acute hemorrhage. No mass effect. Electronically Signed   By: Feliberto Harts MD   On: 02-01-21 18:03   CT HEAD WO CONTRAST  Result Date: 2021-02-01 CLINICAL DATA:  Stroke, post tPA administration, worsening neurologic defects. EXAM: CT HEAD WITHOUT CONTRAST TECHNIQUE: Contiguous axial images were obtained from the base of the skull through the vertex without intravenous contrast. COMPARISON:  MRI 02-01-2021, CT 02/01/2021 FINDINGS: Brain: Slightly increase conspicuity of hypoattenuation in the right lenticulostriate distribution compatible with region of restricted diffusion on comparison MR imaging and corresponding to the site of patient's infarct. No significant interval expansion is discernible on CT imaging when compared to MR. No hyperdense hemorrhage. No extra-axial collection. No significant mass effect or midline shift. Background of diffuse parenchymal volume loss with ex vacuo dilatation of the ventricles. Findings are on a background of more diffuse patchy regions of white matter hypoattenuation typically associated with microvascular angiopathy in a patient of this age. Scattered benign dural calcifications. Cerebellar tonsils are normally position. Stable appearance of the midline intracranial structures. Vascular: Aortic Atherosclerosis (ICD10-I70.0). Skull: No calvarial fracture or suspicious osseous lesion. No scalp swelling or hematoma. Sinuses/Orbits: Paranasal sinuses and mastoid air cells are predominantly clear. Middle ear cavities are clear. Orbital structures are unremarkable aside from prior lens extractions. Other: None. IMPRESSION: Hypoattenuation the right lenticulostriate distribution compatible with known infarct. No significant interval expansion or hyperdense hemorrhage is identified. Background of parenchymal volume loss, microvascular angiopathy and intracranial atherosclerosis. Electronically Signed   By: Kreg Shropshire M.D.   On: 02/01/21 15:23   MR BRAIN WO  CONTRAST  Result Date: 02-01-21 CLINICAL DATA:  Wake up stroke EXAM: MRI HEAD WITHOUT CONTRAST TECHNIQUE: Multiplanar, multiecho pulse sequences of the brain and surrounding structures were obtained without intravenous contrast. COMPARISON:  Head CT from earlier today and brain MRI from 2 days ago FINDINGS: Brain: Wedge of restricted diffusion in the right corona radiata and posterior putamen without any associated FLAIR signal alteration. Generalized atrophy. No hemorrhage, hydrocephalus, or collection. Vascular: Not assessed due to limited protocol techniques. Skull and upper cervical spine: Not primarily assessed Sinuses/Orbits: Negative These results were called by telephone at the time of interpretation on February 01, 2021 at 9:16 am to provider [facility] , who verbally acknowledged these results. IMPRESSION: Restricted diffusion in a right lateral lenticulostriate distribution without FLAIR changes. Electronically Signed   By: Marnee Spring M.D.   On: Feb 01, 2021 09:16   DG CHEST PORT 1 VIEW  Result Date: 2021-02-01 CLINICAL DATA:  Central line placement EXAM: PORTABLE CHEST 1 VIEW COMPARISON:  December 28, 2013 FINDINGS: A left central line is been placed in the interval. The distal tip is flipped back on itself in the region of the proximal SVC. Recommend repositioning. No  pneumothorax. No other changes. IMPRESSION: 1. The distal tip of the new left central line is flipped back on itself, projected over the SVC. No pneumothorax. Recommend repositioning of the line. 2. No other changes. These results will be called to the ordering clinician or representative by the Radiologist Assistant, and communication documented in the PACS or [employer]. Electronically Signed   By: Gerome Sam III M.D   On: 12/26/2020 18:53   ECHOCARDIOGRAM COMPLETE  Result Date: 01/12/2021    ECHOCARDIOGRAM REPORT   Patient Name:   Kimberly Rasmussen Date of Exam: 01/01/2021 Medical Rec #:  [id number]          Height:        60.0 in Accession #:    [id number]         Weight:       145.5 lb Date of Birth:  1925-11-11          BSA:          1.631 m Patient Age:    87 years           BP:           136/97 mmHg Patient Gender: F                  HR:           43 bpm. Exam Location:  Inpatient Procedure: 2D Echo, Cardiac Doppler and Color Doppler Indications:    Stroke  History:        Patient has no prior history of Echocardiogram examinations.                 Risk Factors:Hypertension, Dyslipidemia and Sleep Apnea.  Sonographer:    Ross Ludwig RDCS (AE) Referring Phys: [id number] [facility]  Sonographer Comments: Image acquisition challenging due to respiratory motion. Patient movement. IMPRESSIONS  1. Left ventricular ejection fraction, by estimation, is 65 to 70%. The left ventricle has normal function. The left ventricle has no regional wall motion abnormalities. There is moderate asymmetric left ventricular hypertrophy of the septal segment. Left ventricular diastolic parameters are consistent with Grade I diastolic dysfunction (impaired relaxation).  2. Right ventricular systolic function is normal. The right ventricular size is normal. Tricuspid regurgitation signal is inadequate for assessing PA pressure.  3. The mitral valve is normal in structure. No evidence of mitral valve regurgitation.  4. The aortic valve was not well visualized. Aortic valve regurgitation is mild. No aortic stenosis is present. FINDINGS  Left Ventricle: Left ventricular ejection fraction, by estimation, is 65 to 70%. The left ventricle has normal function. The left ventricle has no regional wall motion abnormalities. The left ventricular internal cavity size was small. There is moderate  asymmetric left ventricular hypertrophy of the septal segment. Left ventricular diastolic parameters are consistent with Grade I diastolic dysfunction (impaired relaxation). Right Ventricle: The right ventricular size is normal. No increase in right ventricular wall  thickness. Right ventricular systolic function is normal. Tricuspid regurgitation signal is inadequate for assessing PA pressure. Left Atrium: Left atrial size was normal in size. Right Atrium: Right atrial size was not well visualized. Pericardium: There is no evidence of pericardial effusion. Presence of pericardial fat pad. Mitral Valve: The mitral valve is normal in structure. No evidence of mitral valve regurgitation. Tricuspid Valve: The tricuspid valve is normal in structure. Tricuspid valve regurgitation is trivial. Aortic Valve: The aortic valve was not well visualized. Aortic valve regurgitation is mild. Aortic regurgitation PHT measures 538 msec. No  aortic stenosis is present. Aortic valve mean gradient measures 4.0 mmHg. Aortic valve peak gradient measures 9.1 mmHg. Aortic valve area, by VTI measures 2.67 cm. Pulmonic Valve: The pulmonic valve was not well visualized. Pulmonic valve regurgitation is not visualized. Aorta: The aortic root is normal in size and structure. IAS/Shunts: The interatrial septum was not well visualized.  LEFT VENTRICLE PLAX 2D LVIDd:         2.90 cm  Diastology LVIDs:         2.10 cm  LV e' medial:    4.79 cm/s LV PW:         1.40 cm  LV E/e' medial:  12.3 LV IVS:        1.40 cm  LV e' lateral:   9.90 cm/s LVOT diam:     2.10 cm  LV E/e' lateral: 5.9 LV SV:         57 LV SV Index:   35 LVOT Area:     3.46 cm  IVC IVC diam: 1.00 cm LEFT ATRIUM             Index LA diam:        2.70 cm 1.66 cm/m LA Vol (A2C):   28.9 ml 17.72 ml/m LA Vol (A4C):   34.6 ml 21.22 ml/m LA Biplane Vol: 31.9 ml 19.56 ml/m  AORTIC VALVE AV Area (Vmax):    2.11 cm AV Area (Vmean):   2.51 cm AV Area (VTI):     2.67 cm AV Vmax:           151.00 cm/s AV Vmean:          88.700 cm/s AV VTI:            0.214 m AV Peak Grad:      9.1 mmHg AV Mean Grad:      4.0 mmHg LVOT Vmax:         91.80 cm/s LVOT Vmean:        64.400 cm/s LVOT VTI:          0.165 m LVOT/AV VTI ratio: 0.77 AI PHT:            538 msec   AORTA Ao Root diam: 3.60 cm MITRAL VALVE MV Area (PHT): 2.42 cm     SHUNTS MV Decel Time: 314 msec     Systemic VTI:  0.16 m MV E velocity: 58.70 cm/s   Systemic Diam: 2.10 cm MV A velocity: 122.00 cm/s MV E/A ratio:  0.48 Epifanio Lesches MD Electronically signed by Epifanio Lesches MD Signature Date/Time: 12/26/2020/3:20:13 PM    Final    CT HEAD CODE STROKE WO CONTRAST  Result Date: 01/04/2021 CLINICAL DATA:  Code stroke. EXAM: CT HEAD WITHOUT CONTRAST TECHNIQUE: Contiguous axial images were obtained from the base of the skull through the vertex without intravenous contrast. COMPARISON:  Brain MRI 01/18/2020 FINDINGS: Brain: No evidence of acute infarction, hemorrhage, hydrocephalus, extra-axial collection or mass lesion/mass effect. Generalized atrophy which is prominent. Vascular: No hyperdense vessel or unexpected calcification. Skull: Normal. Negative for fracture or focal lesion. Sinuses/Orbits: No acute finding. Other: These results were communicated to Dr Sal at 8:57 amon 06/15/2022by text page via the [facility] messaging system. ASPECTS [facility] Stroke Program Early CT Score) Not scored without localizing history IMPRESSION: 1. No acute finding. 2. Generalized atrophy. Electronically Signed   By: Marnee Spring M.D.   On: 01/19/2021 08:58   VAS US CAROTID  Result Date: 01/20/2021 Carotid Arterial Duplex Study Patient Name:  Grant Fontana  Date of Exam:   01/20/2021 Medical Rec #: [id number]           Accession #:    [id number] Date of Birth: 08/15/1925           Patient Gender: F Patient Age:   89Y Exam Location:  [facility] Procedure:      VAS US CAROTID Referring Phys: [id number] [facility] --------------------------------------------------------------------------------  Indications:       CVA. Limitations        Today's exam was limited due to PICC and bandaging LT side,                    patient unable to reposition- RT sided lean. Comparison Study:  No prior studies.  Performing Technologist: Jean Rosenthal RDMS,RVT  Examination Guidelines: A complete evaluation includes B-mode imaging, spectral Doppler, color Doppler, and power Doppler as needed of all accessible portions of each vessel. Bilateral testing is considered an integral part of a complete examination. Limited examinations for reoccurring indications may be performed as noted.  Right Carotid Findings: +----------+-------+-------+--------+------------------------+-----------------+           PSV    EDV    StenosisPlaque Description      Comments                    cm/s   cm/s                                                     +----------+-------+-------+--------+------------------------+-----------------+ CCA Prox  66     13                                                       +----------+-------+-------+--------+------------------------+-----------------+ CCA Distal65     17                                     intimal                                                                   thickening        +----------+-------+-------+--------+------------------------+-----------------+ ICA Prox  45     12     1-39%   heterogenous and                                                          irregular                                 +----------+-------+-------+--------+------------------------+-----------------+ ICA Distal90     30  tortuous          +----------+-------+-------+--------+------------------------+-----------------+ ECA       73     11                                                       +----------+-------+-------+--------+------------------------+-----------------+ +----------+--------+-------+----------------+-------------------+           PSV cm/sEDV cmsDescribe        Arm Pressure (mmHG) +----------+--------+-------+----------------+-------------------+ [id number]             Multiphasic, WNL                     +----------+--------+-------+----------------+-------------------+ +---------+--------+--+--------+--+---------+ VertebralPSV cm/s75EDV cm/s19Antegrade +---------+--------+--+--------+--+---------+  Left Carotid Findings: +----------+--------+--------+--------+---------------------+------------------+           PSV cm/sEDV cm/sStenosisPlaque Description   Comments           +----------+--------+--------+--------+---------------------+------------------+ CCA Prox  51      8                                    intimal thickening +----------+--------+--------+--------+---------------------+------------------+ CCA Distal51      7                                    intimal thickening +----------+--------+--------+--------+---------------------+------------------+ ICA Prox  21      7       1-39%   heterogenous and                                                          hyperechoic                             +----------+--------+--------+--------+---------------------+------------------+ ICA Distal39      12                                   tortuous           +----------+--------+--------+--------+---------------------+------------------+ ECA       37                                                              +----------+--------+--------+--------+---------------------+------------------+ +----------+--------+--------+------------+-------------------+           PSV cm/sEDV cm/sDescribe    Arm Pressure (mmHG) +----------+--------+--------+------------+-------------------+ Subclavian                Not assessed                    +----------+--------+--------+------------+-------------------+ +---------+--------+--------+------------+ VertebralPSV cm/sEDV cm/sNot assessed +---------+--------+--------+------------+   Summary: Right Carotid: Velocities in the right ICA are consistent with a 1-39% stenosis. Left Carotid: Velocities in the  left ICA are consistent with a 1-39% stenosis. Vertebrals:  Right vertebral artery demonstrates antegrade flow. Left vertebral              artery was not visualized. Subclavians: Left subclavian artery was not visualized. Normal flow hemodynamics              were seen in the right subclavian artery. *See table(s) above for measurements and observations.  Electronically signed by Delia Heady MD on 01/20/2021 at 5:33:22 PM.    Final    CT Angio Abd/Pel w/ and/or w/o  Result Date: 01/20/2021 CLINICAL DATA:  Gastrointestinal hemorrhage EXAM: CTA ABDOMEN AND PELVIS WITHOUT AND WITH CONTRAST TECHNIQUE: Multidetector CT imaging of the abdomen and pelvis was performed using the standard protocol during bolus administration of intravenous contrast. Multiplanar reconstructed images and MIPs were obtained and reviewed to evaluate the vascular anatomy. CONTRAST:  OMNIPAQUE IOHEXOL 350 MG/ML SOLN COMPARISON:  None. FINDINGS: VASCULAR Aorta: Moderate mixed atherosclerotic plaque. No evidence of hemodynamically significant stenosis. No aneurysm or dissection. No periaortic inflammatory change. Celiac: Widely patent. Normal anatomic configuration. No dissection or aneurysm. SMA: Less than 50% stenosis at the origin. Distally widely patent. No aneurysm or dissection. Renals: Single renal arteries bilaterally are widely patent and demonstrate normal vascular morphology. No aneurysm. IMA: Patent. Inflow: The lower extremity arterial inflow is widely patent on the right. A focal less than 50% stenosis is seen of the left external iliac artery in its mid segment. The right internal iliac artery is patent at its origin. The left internal iliac artery demonstrates near occlusion at its origin, but is patent. 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 Distally, fusiform aneurysm of the internal iliac arteries seen to the level of its anterior and posterior division.  Proximal Outflow: Less than 50% stenosis of the left SFA at its origin. Otherwise widely patent. Veins: Unremarkable. Review of the MIP images confirms the above findings. NON-VASCULAR Lower chest: Mild bibasilar fibrotic change. Moderate coronary artery calcification. Global cardiac size within normal limits. Small hiatal hernia. Hepatobiliary: Surgical changes of probable partial right hepatectomy and cholecystectomy are identified. There is pneumobilia within the liver noted in keeping with prior sphincterotomy. Mild intrahepatic and moderate extrahepatic biliary ductal dilation may represent post cholecystectomy change. No focal intrahepatic masses identified. Pancreas: Unremarkable Spleen: Unremarkable Adrenals/Urinary Tract: The adrenal glands are unremarkable. The kidneys are normal in size and position. Exophytic 5.1 cm simple cortical cyst arises from the interpolar region of the left kidney. Additional tiny cortical cysts are seen bilaterally. The kidneys are otherwise unremarkable. The bladder is unremarkable. Stomach/Bowel: Evaluation of bowel is slightly limited by motion artifact. There is severe descending and sigmoid colonic diverticulosis. Scattered diverticulosis is seen throughout the proximal colon. There is eccentric mural thickening involving the proximal sigmoid colon which may simply represent muscular hypertrophy in the setting of severe sigmoid diverticulosis, however, and eccentric mural mass with appear similarly. The stomach, small bowel, and large bowel are otherwise unremarkable. Appendix normal. No free intraperitoneal gas or fluid. Lymphatic: No pathologic adenopathy within the abdomen and pelvis. Reproductive: Status post hysterectomy. No adnexal masses. Other: No abdominal wall hernia. Musculoskeletal: Degenerative changes are seen within the lumbar spine. No acute bone abnormality. No lytic or blastic bone lesion. 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 NON-VASCULAR No evidence of active extravasation to account for the patient's reported  gastrointestinal hemorrhage. Severe descending and sigmoid colonic diverticulosis. Eccentric mural thickening within the proximal sigmoid colon may simply represent the sequela of muscular hypertrophy, however, correlation with recent endoscopy is recommended to exclude the presence of an underlying mural mass. Aortic Atherosclerosis (ICD10-I70.0). Electronically Signed   By: Helyn NumbersAshesh  Parikh MD   On: 01/20/2021 02:25     PHYSICAL EXAM  Pulse Rate:  [60-75] 67 (06/28 0600) Resp:  [10-35] 11 (06/28 0600) BP: (148-166)/(54-65) 166/54 (06/27 1100) SpO2:  [84 %-97 %] 93 % (06/28 0600)  General - Well nourished, well developed, not in distress  Ophthalmologic - fundi not visualized due to noncooperation.  Cardiovascular - Regular rhythm and rate.  Neuro - limited exam due to full comfort care. Pt eyes closed, not open on voice, not following commands. Unresponsive. No respiratory distress. On morphine drip. No spontaneous movement in all extremities.   ASSESSMENT/PLAN Ms. Grant FontanaCatherine Kinkead is a 87 y.o. female with history of HTN, HLD, GIB admitted for left arm weakness, not able to walk and slurry speech. TPA was given  Stroke:  right BG/CR infarct, likely small vessel disease  CT head no acute abnormality MRI showed right BG/CR infarct on DWI but negative on FLAIR T2 2D Echo EF 65-70% LDL 86 HgbA1c 5.3 SCDs for VTE prophylaxis No antithrombotic prior to admission, now on No antithrombotic for comfort care. Disposition:  on full comfort care now per family wishes. Anticipate hospital death.  LGIB Pt had LGIB 3 years ago due to diverticulosis Active LGIB post tPA Received 4U PRBC S/p TXA reversal Fibrinogen 200 GI on board, no intervention recommended Hb 13.6->11.0->11.5 Vital stable post fluid  resus Daughter reported less LGIB today  Hypertension Stable  Hyperlipidemia Home meds:  none  LDL 87  Other Stroke Risk Factors Advanced age Former smoker, quit 25 years ago  Other Active Problems AKI Cre 0.80->1.24  Hospital day # 2   Marvel PlanJindong Oluwatobi Ruppe, MD PhD Stroke Neurology 01/21/2021 9:40 AM    To contact Stroke Continuity provider, please refer to [email or web address]. After hours, contact General Neurology

## 2021-01-24 NOTE — Plan of Care (Signed)
Notified of patient passing by nursing staff.   Dr. Roda Shutters or I will complete death certificate electronically

## 2021-01-24 NOTE — Death Summary Note (Signed)
DEATH SUMMARY   Patient Details  Name: Kimberly Rasmussen MRN: [id number] DOB: Sep 15, 1925  Admission/Discharge Information   Admit Date:  02/03/2021  Date of Death: Date of Death: 02/06/21  Time of Death: Time of Death: 12/10/2046  Length of Stay: 3  Referring Physician: [name], MD   Reason(s) for Hospitalization  Stroke s/p tPA  Diagnoses  Preliminary cause of death:  Secondary Diagnoses (including complications and co-morbidities):  Active Problems:   Acute right MCA stroke [facility]) s/p tPA   LGIB   HTN   HLD   AKI      Brief Hospital Course (including significant findings, care, treatment, and services provided and events leading to death)  Kimberly Rasmussen is a 87 y.o. year old female who with history of HTN, HLD, GIB admitted for left arm weakness, not able to walk and slurry speech. TPA was given at 02/04/23 9:30 AM   Stroke:  right BG/CR infarct, likely small vessel disease CT head no acute abnormality MRI showed right BG/CR infarct on DWI but negative on FLAIR T2 2D Echo EF 65-70% LDL 86 HgbA1c 5.3 SCDs for VTE prophylaxis No antithrombotic prior to admission, now on No antithrombotic for comfort care. Disposition:  on full comfort care now per family wishes. Pt expired February 07, 2023 0048    LGIB Pt had LGIB 3 years ago due to diverticulosis Active LGIB post tPA Received 4U PRBC S/p TXA reversal Fibrinogen 200 GI on board, no intervention recommended Hb 13.6->11.0->11.5 Vital stable post fluid resus LGIB gradually improved some overtime   Hypertension Stable   Hyperlipidemia Home meds:  none LDL 87   Other Stroke Risk Factors Advanced age Former smoker, quit 25 years ago   Other Active Problems AKI Cre 0.80->1.24    Pertinent Labs and Studies  Significant Diagnostic Studies CT HEAD WO CONTRAST  Result Date: 01/20/2021 CLINICAL DATA:  Follow-up post tPA.  Stroke. EXAM: CT HEAD WITHOUT CONTRAST TECHNIQUE: Contiguous axial images were obtained from  the base of the skull through the vertex without intravenous contrast. COMPARISON:  2021-02-03 FINDINGS: Brain: Again noted is the acute infarct in the lenticulostriate region on the right. No real change since prior study. No hemorrhage. No new areas of acute infarct or hydrocephalus. Vascular: No hyperdense vessel or unexpected calcification. Skull: No acute calvarial abnormality. Sinuses/Orbits: No acute findings Other: None IMPRESSION: Stable acute right lenticulostriate infarct. No evidence of hemorrhage. Electronically Signed   By: Charlett Nose M.D.   On: 01/20/2021 02:06   CT HEAD WO CONTRAST  Result Date: Feb 03, 2021 CLINICAL DATA:  Cerebral hemorrhage suspected.  Status post tPA. EXAM: CT HEAD WITHOUT CONTRAST TECHNIQUE: Contiguous axial images were obtained from the base of the skull through the vertex without intravenous contrast. COMPARISON:  02-03-2021 CT head. FINDINGS: Brain: Mild right lenticulostriate hypoattenuation, compatible with infarct that was better seen on recent MRI. No evidence of acute hemorrhage. Similar atrophy and ex vacuo ventricular dilation. No hydrocephalus. No midline shift. Basal cisterns are patent. Vascular: No hyperdense vessel identified. Calcific atherosclerosis. Skull: No acute fracture. Sinuses/Orbits: Visualized sinuses are clear. No acute orbital findings. Other: No mastoid effusions. IMPRESSION: Evolving acute right lenticulostriate infarct without evidence of acute hemorrhage. No mass effect. Electronically Signed   By: Feliberto Harts MD   On: 02-03-21 18:03   CT HEAD WO CONTRAST  Result Date: 02-03-2021 CLINICAL DATA:  Stroke, post tPA administration, worsening neurologic defects. EXAM: CT HEAD WITHOUT CONTRAST TECHNIQUE: Contiguous axial images were obtained from the base of the  skull through the vertex without intravenous contrast. COMPARISON:  MRI 12/26/2020, CT 01/06/2021 FINDINGS: Brain: Slightly increase conspicuity of hypoattenuation in the  right lenticulostriate distribution compatible with region of restricted diffusion on comparison MR imaging and corresponding to the site of patient's infarct. No significant interval expansion is discernible on CT imaging when compared to MR. No hyperdense hemorrhage. No extra-axial collection. No significant mass effect or midline shift. Background of diffuse parenchymal volume loss with ex vacuo dilatation of the ventricles. Findings are on a background of more diffuse patchy regions of white matter hypoattenuation typically associated with microvascular angiopathy in a patient of this age. Scattered benign dural calcifications. Cerebellar tonsils are normally position. Stable appearance of the midline intracranial structures. Vascular: Aortic Atherosclerosis (ICD10-I70.0). Skull: No calvarial fracture or suspicious osseous lesion. No scalp swelling or hematoma. Sinuses/Orbits: Paranasal sinuses and mastoid air cells are predominantly clear. Middle ear cavities are clear. Orbital structures are unremarkable aside from prior lens extractions. Other: None. IMPRESSION: Hypoattenuation the right lenticulostriate distribution compatible with known infarct. No significant interval expansion or hyperdense hemorrhage is identified. Background of parenchymal volume loss, microvascular angiopathy and intracranial atherosclerosis. Electronically Signed   By: Kreg Shropshire M.D.   On: 01/09/2021 15:23   MR BRAIN WO CONTRAST  Result Date: 01/09/2021 CLINICAL DATA:  Wake up stroke EXAM: MRI HEAD WITHOUT CONTRAST TECHNIQUE: Multiplanar, multiecho pulse sequences of the brain and surrounding structures were obtained without intravenous contrast. COMPARISON:  Head CT from earlier today and brain MRI from 2 days ago FINDINGS: Brain: Wedge of restricted diffusion in the right corona radiata and posterior putamen without any associated FLAIR signal alteration. Generalized atrophy. No hemorrhage, hydrocephalus, or collection.  Vascular: Not assessed due to limited protocol techniques. Skull and upper cervical spine: Not primarily assessed Sinuses/Orbits: Negative These results were called by telephone at the time of interpretation on 12/26/2020 at 9:16 am to provider [facility] , who verbally acknowledged these results. IMPRESSION: Restricted diffusion in a right lateral lenticulostriate distribution without FLAIR changes. Electronically Signed   By: Marnee Spring M.D.   On: 01/14/2021 09:16   DG CHEST PORT 1 VIEW  Result Date: 12/30/2020 CLINICAL DATA:  Central line placement EXAM: PORTABLE CHEST 1 VIEW COMPARISON:  December 28, 2013 FINDINGS: A left central line is been placed in the interval. The distal tip is flipped back on itself in the region of the proximal SVC. Recommend repositioning. No pneumothorax. No other changes. IMPRESSION: 1. The distal tip of the new left central line is flipped back on itself, projected over the SVC. No pneumothorax. Recommend repositioning of the line. 2. No other changes. These results will be called to the ordering clinician or representative by the Radiologist Assistant, and communication documented in the PACS or [employer]. Electronically Signed   By: Gerome Sam III M.D   On: 01/18/2021 18:53   ECHOCARDIOGRAM COMPLETE  Result Date: 01/14/2021    ECHOCARDIOGRAM REPORT   Patient Name:   FRAYDA EGLEY Date of Exam: 01/05/2021 Medical Rec #:  [id number]          Height:       60.0 in Accession #:    [id number]         Weight:       145.5 lb Date of Birth:  January 28, 1926          BSA:          1.631 m Patient Age:    87 years  BP:           136/97 mmHg Patient Gender: F                  HR:           43 bpm. Exam Location:  Inpatient Procedure: 2D Echo, Cardiac Doppler and Color Doppler Indications:    Stroke  History:        Patient has no prior history of Echocardiogram examinations.                 Risk Factors:Hypertension, Dyslipidemia and Sleep Apnea.  Sonographer:     Ross Ludwig RDCS (AE) Referring Phys: [id number] [facility]  Sonographer Comments: Image acquisition challenging due to respiratory motion. Patient movement. IMPRESSIONS  1. Left ventricular ejection fraction, by estimation, is 65 to 70%. The left ventricle has normal function. The left ventricle has no regional wall motion abnormalities. There is moderate asymmetric left ventricular hypertrophy of the septal segment. Left ventricular diastolic parameters are consistent with Grade I diastolic dysfunction (impaired relaxation).  2. Right ventricular systolic function is normal. The right ventricular size is normal. Tricuspid regurgitation signal is inadequate for assessing PA pressure.  3. The mitral valve is normal in structure. No evidence of mitral valve regurgitation.  4. The aortic valve was not well visualized. Aortic valve regurgitation is mild. No aortic stenosis is present. FINDINGS  Left Ventricle: Left ventricular ejection fraction, by estimation, is 65 to 70%. The left ventricle has normal function. The left ventricle has no regional wall motion abnormalities. The left ventricular internal cavity size was small. There is moderate  asymmetric left ventricular hypertrophy of the septal segment. Left ventricular diastolic parameters are consistent with Grade I diastolic dysfunction (impaired relaxation). Right Ventricle: The right ventricular size is normal. No increase in right ventricular wall thickness. Right ventricular systolic function is normal. Tricuspid regurgitation signal is inadequate for assessing PA pressure. Left Atrium: Left atrial size was normal in size. Right Atrium: Right atrial size was not well visualized. Pericardium: There is no evidence of pericardial effusion. Presence of pericardial fat pad. Mitral Valve: The mitral valve is normal in structure. No evidence of mitral valve regurgitation. Tricuspid Valve: The tricuspid valve is normal in structure. Tricuspid valve  regurgitation is trivial. Aortic Valve: The aortic valve was not well visualized. Aortic valve regurgitation is mild. Aortic regurgitation PHT measures 538 msec. No aortic stenosis is present. Aortic valve mean gradient measures 4.0 mmHg. Aortic valve peak gradient measures 9.1 mmHg. Aortic valve area, by VTI measures 2.67 cm. Pulmonic Valve: The pulmonic valve was not well visualized. Pulmonic valve regurgitation is not visualized. Aorta: The aortic root is normal in size and structure. IAS/Shunts: The interatrial septum was not well visualized.  LEFT VENTRICLE PLAX 2D LVIDd:         2.90 cm  Diastology LVIDs:         2.10 cm  LV e' medial:    4.79 cm/s LV PW:         1.40 cm  LV E/e' medial:  12.3 LV IVS:        1.40 cm  LV e' lateral:   9.90 cm/s LVOT diam:     2.10 cm  LV E/e' lateral: 5.9 LV SV:         57 LV SV Index:   35 LVOT Area:     3.46 cm  IVC IVC diam: 1.00 cm LEFT ATRIUM  Index LA diam:        2.70 cm 1.66 cm/m LA Vol (A2C):   28.9 ml 17.72 ml/m LA Vol (A4C):   34.6 ml 21.22 ml/m LA Biplane Vol: 31.9 ml 19.56 ml/m  AORTIC VALVE AV Area (Vmax):    2.11 cm AV Area (Vmean):   2.51 cm AV Area (VTI):     2.67 cm AV Vmax:           151.00 cm/s AV Vmean:          88.700 cm/s AV VTI:            0.214 m AV Peak Grad:      9.1 mmHg AV Mean Grad:      4.0 mmHg LVOT Vmax:         91.80 cm/s LVOT Vmean:        64.400 cm/s LVOT VTI:          0.165 m LVOT/AV VTI ratio: 0.77 AI PHT:            538 msec  AORTA Ao Root diam: 3.60 cm MITRAL VALVE MV Area (PHT): 2.42 cm     SHUNTS MV Decel Time: 314 msec     Systemic VTI:  0.16 m MV E velocity: 58.70 cm/s   Systemic Diam: 2.10 cm MV A velocity: 122.00 cm/s MV E/A ratio:  0.48 Epifanio Lescheshristopher Schumann MD Electronically signed by Epifanio Lescheshristopher Schumann MD Signature Date/Time: Nov 26, 2020/3:20:13 PM    Final    CT HEAD CODE STROKE WO CONTRAST  Result Date: Nov 26, 2020 CLINICAL DATA:  Code stroke. EXAM: CT HEAD WITHOUT CONTRAST TECHNIQUE: Contiguous axial  images were obtained from the base of the skull through the vertex without intravenous contrast. COMPARISON:  Brain MRI 01/18/2020 FINDINGS: Brain: No evidence of acute infarction, hemorrhage, hydrocephalus, extra-axial collection or mass lesion/mass effect. Generalized atrophy which is prominent. Vascular: No hyperdense vessel or unexpected calcification. Skull: Normal. Negative for fracture or focal lesion. Sinuses/Orbits: No acute finding. Other: These results were communicated to Dr Sal at 8:57 amon May 03, 2022by text page via the [facility] messaging system. ASPECTS [facility]([place] Stroke Program Early CT Score) Not scored without localizing history IMPRESSION: 1. No acute finding. 2. Generalized atrophy. Electronically Signed   By: Marnee SpringJonathon  Watts M.D.   On: 0May 03, 2022 08:58   VAS US CAROTID  Result Date: 01/20/2021 Carotid Arterial Duplex Study Patient Name:  Grant FontanaCATHERINE Jenne  Date of Exam:   01/20/2021 Medical Rec #: [id number]           Accession #:    [id number] Date of Birth: 03-28-1926           Patient Gender: F Patient Age:   89Y [facility] Procedure:      VAS US CAROTID Referring Phys: [id number] [facility] --------------------------------------------------------------------------------  Indications:       CVA. Limitations        Today's exam was limited due to PICC and bandaging LT side,                    patient unable to reposition- RT sided lean. Comparison Study:  No prior studies. Performing Technologist: Jean Rosenthalachel Hodge RDMS,RVT  Examination Guidelines: A complete evaluation includes B-mode imaging, spectral Doppler, color Doppler, and power Doppler as needed of all accessible portions of each vessel. Bilateral testing is considered an integral part of a complete examination. Limited examinations for reoccurring indications may be performed as noted.  Right Carotid Findings: +----------+-------+-------+--------+------------------------+-----------------+  PSV    EDV     StenosisPlaque Description      Comments                    cm/s   cm/s                                                     +----------+-------+-------+--------+------------------------+-----------------+ CCA Prox  66     13                                                       +----------+-------+-------+--------+------------------------+-----------------+ CCA Distal65     17                                     intimal                                                                   thickening        +----------+-------+-------+--------+------------------------+-----------------+ ICA Prox  45     12     1-39%   heterogenous and                                                          irregular                                 +----------+-------+-------+--------+------------------------+-----------------+ ICA Distal90     30                                     tortuous          +----------+-------+-------+--------+------------------------+-----------------+ ECA       73     11                                                       +----------+-------+-------+--------+------------------------+-----------------+ +----------+--------+-------+----------------+-------------------+           PSV cm/sEDV cmsDescribe        Arm Pressure (mmHG) +----------+--------+-------+----------------+-------------------+ [id number]             Multiphasic, WNL                    +----------+--------+-------+----------------+-------------------+ +---------+--------+--+--------+--+---------+ VertebralPSV cm/s75EDV cm/s19Antegrade +---------+--------+--+--------+--+---------+  Left Carotid Findings: +----------+--------+--------+--------+---------------------+------------------+           PSV cm/sEDV cm/sStenosisPlaque Description   Comments           +----------+--------+--------+--------+---------------------+------------------+  CCA Prox  51       8                                    intimal thickening +----------+--------+--------+--------+---------------------+------------------+ CCA Distal51      7                                    intimal thickening +----------+--------+--------+--------+---------------------+------------------+ ICA Prox  21      7       1-39%   heterogenous and                                                          hyperechoic                             +----------+--------+--------+--------+---------------------+------------------+ ICA Distal39      12                                   tortuous           +----------+--------+--------+--------+---------------------+------------------+ ECA       37                                                              +----------+--------+--------+--------+---------------------+------------------+ +----------+--------+--------+------------+-------------------+           PSV cm/sEDV cm/sDescribe    Arm Pressure (mmHG) +----------+--------+--------+------------+-------------------+ Subclavian                Not assessed                    +----------+--------+--------+------------+-------------------+ +---------+--------+--------+------------+ VertebralPSV cm/sEDV cm/sNot assessed +---------+--------+--------+------------+   Summary: Right Carotid: Velocities in the right ICA are consistent with a 1-39% stenosis. Left Carotid: Velocities in the left ICA are consistent with a 1-39% stenosis. Vertebrals:  Right vertebral artery demonstrates antegrade flow. Left vertebral              artery was not visualized. Subclavians: Left subclavian artery was not visualized. Normal flow hemodynamics              were seen in the right subclavian artery. *See table(s) above for measurements and observations.  Electronically signed by Delia Heady MD on 01/20/2021 at 5:33:22 PM.    Final    CT Angio Abd/Pel w/ and/or w/o  Result Date:  01/20/2021 CLINICAL DATA:  Gastrointestinal hemorrhage EXAM: CTA ABDOMEN AND PELVIS WITHOUT AND WITH CONTRAST TECHNIQUE: Multidetector CT imaging of the abdomen and pelvis was performed using the standard protocol during bolus administration of intravenous contrast. Multiplanar reconstructed images and MIPs were obtained and reviewed to evaluate the vascular anatomy. CONTRAST:  OMNIPAQUE IOHEXOL 350 MG/ML SOLN COMPARISON:  None. FINDINGS: VASCULAR Aorta: Moderate mixed atherosclerotic plaque. No evidence of hemodynamically significant stenosis. No aneurysm or dissection. No periaortic inflammatory change. Celiac: Widely patent. Normal  anatomic configuration. No dissection or aneurysm. SMA: Less than 50% stenosis at the origin. Distally widely patent. No aneurysm or dissection. Renals: Single renal arteries bilaterally are widely patent and demonstrate normal vascular morphology. No aneurysm. IMA: Patent. Inflow: The lower extremity arterial inflow is widely patent on the right. A focal less than 50% stenosis is seen of the left external iliac artery in its mid segment. The right internal iliac artery is patent at its origin. The left internal iliac artery demonstrates near occlusion at its origin, but is patent. 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 Distally, fusiform aneurysm of the internal iliac arteries seen to the level of its anterior and posterior division. Proximal Outflow: Less than 50% stenosis of the left SFA at its origin. Otherwise widely patent. Veins: Unremarkable. Review of the MIP images confirms the above findings. NON-VASCULAR Lower chest: Mild bibasilar fibrotic change. Moderate coronary artery calcification. Global cardiac size within normal limits. Small hiatal hernia. Hepatobiliary: Surgical changes of probable partial right hepatectomy and cholecystectomy are identified. There is pneumobilia within the liver noted in  keeping with prior sphincterotomy. Mild intrahepatic and moderate extrahepatic biliary ductal dilation may represent post cholecystectomy change. No focal intrahepatic masses identified. Pancreas: Unremarkable Spleen: Unremarkable Adrenals/Urinary Tract: The adrenal glands are unremarkable. The kidneys are normal in size and position. Exophytic 5.1 cm simple cortical cyst arises from the interpolar region of the left kidney. Additional tiny cortical cysts are seen bilaterally. The kidneys are otherwise unremarkable. The bladder is unremarkable. Stomach/Bowel: Evaluation of bowel is slightly limited by motion artifact. There is severe descending and sigmoid colonic diverticulosis. Scattered diverticulosis is seen throughout the proximal colon. There is eccentric mural thickening involving the proximal sigmoid colon which may simply represent muscular hypertrophy in the setting of severe sigmoid diverticulosis, however, and eccentric mural mass with appear similarly. The stomach, small bowel, and large bowel are otherwise unremarkable. Appendix normal. No free intraperitoneal gas or fluid. Lymphatic: No pathologic adenopathy within the abdomen and pelvis. Reproductive: Status post hysterectomy. No adnexal masses. Other: No abdominal wall hernia. Musculoskeletal: Degenerative changes are seen within the lumbar spine. No acute bone abnormality. No lytic or blastic bone lesion. 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 NON-VASCULAR No evidence of active extravasation to account for the patient's reported gastrointestinal hemorrhage. Severe descending and sigmoid colonic diverticulosis. Eccentric mural thickening within the proximal sigmoid colon may simply represent the sequela of muscular hypertrophy, however, correlation with recent endoscopy is recommended to exclude the presence of an  underlying mural mass. Aortic Atherosclerosis (ICD10-I70.0). Electronically Signed   By: Helyn Numbers MD   On: 01/20/2021 02:25    Microbiology Recent Results (from the past 240 hour(s))  SARS CORONAVIRUS 2 (TAT 6-24 HRS) Nasopharyngeal Nasopharyngeal Swab     Status: None   Collection Time: 02-05-2021 11:04 AM   Specimen: Nasopharyngeal Swab  Result Value Ref Range Status   SARS Coronavirus 2 NEGATIVE NEGATIVE Final    Comment: (NOTE) SARS-CoV-2 target nucleic acids are NOT DETECTED.  The SARS-CoV-2 RNA is generally detectable in upper and lower respiratory specimens during the acute phase of infection. Negative results do not preclude SARS-CoV-2 infection, do not rule out co-infections with other pathogens, and should not be used as the sole basis for treatment or other patient management decisions. Negative results must be  combined with clinical observations, patient history, and epidemiological information. The expected result is Negative.  Fact Sheet for Patients: HairSlick.no  Fact Sheet for Healthcare Providers: quierodirigir.com  This test is not yet approved or cleared by the [place] FDA and  has been authorized for detection and/or diagnosis of SARS-CoV-2 by FDA under an Emergency Use Authorization (EUA). This EUA will remain  in effect (meaning this test can be used) for the duration of the COVID-19 declaration under Se ction 564(b)(1) of the Act, 21 U.S.C. section 360bbb-3(b)(1), unless the authorization is terminated or revoked sooner.  Performed at [facility], [address], [place], [place] [address]   MRSA Next Gen by PCR, Nasal     Status: None   Collection Time: 02/03/2021 11:48 AM   Specimen: Nasal Mucosa; Nasal Swab  Result Value Ref Range Status   MRSA by PCR Next Gen NOT DETECTED NOT DETECTED Final    Comment: (NOTE) The GeneXpert MRSA Assay (FDA approved for NASAL specimens only), is  one component of a comprehensive MRSA colonization surveillance program. It is not intended to diagnose MRSA infection nor to guide or monitor treatment for MRSA infections. Test performance is not FDA approved in patients less than 76 years old. Performed at [facility], [address], [place], [place] [address]     Lab Basic Metabolic Panel: Recent Labs  Lab February 03, 2021 [id number] [id number] 02/03/21 0848 01/20/21 0207 01/20/21 0230  NA 140 142 139 143  K 4.0 3.9 3.8 4.0  CL 109 108 110  --   CO2 22  --  18*  --   GLUCOSE 97 97 141*  --   BUN 21 21 31*  --   CREATININE 0.90 0.80 1.24*  --   CALCIUM 9.7  --  7.4*  --   MG  --   --  1.6*  --   PHOS  --   --  5.4*  --    Liver Function Tests: Recent Labs  Lab Feb 03, 2021 0839  AST 18  ALT 12  ALKPHOS 57  BILITOT 0.4  PROT 7.0  ALBUMIN 3.4*   No results for input(s): LIPASE, AMYLASE in the last 168 hours. No results for input(s): AMMONIA in the last 168 hours. CBC: Recent Labs  Lab February 03, 2021 0839 2021-02-03 0848 02/03/21 1644 Feb 03, 2021 2308 01/20/21 0230 01/20/21 0526  WBC 7.8  --   --   --   --   --   NEUTROABS 5.8  --   --   --   --   --   HGB 13.3 13.6 11.0* 11.5* 11.6* 11.5*  HCT 41.3 40.0 33.6* 36.6 34.0* 35.2*  MCV 90.2  --   --   --   --   --   PLT 384  --   --   --   --   --    Cardiac Enzymes: No results for input(s): CKTOTAL, CKMB, CKMBINDEX, TROPONINI in the last 168 hours. Sepsis Labs: Recent Labs  Lab 02/03/21 0839 01/20/21 0207 01/20/21 0521  WBC 7.8  --   --   LATICACIDVEN  --  2.6* 1.9    Procedures/Operations  none   Richie Bonanno 01/15/2021, 7:52 AM

## 2021-01-24 NOTE — Progress Notes (Signed)
Wasted 50cc of Morphine with Winfield Rast, RN

## 2021-01-24 DEATH — deceased
